# Patient Record
Sex: Male | Born: 1950 | Race: White | Hispanic: No | Marital: Married | Smoking: Former smoker
Health system: Southern US, Community
[De-identification: ages and names within clinical notes are randomized; demographics above are authoritative.]

## PROBLEM LIST (undated history)

## (undated) DIAGNOSIS — M1990 Unspecified osteoarthritis, unspecified site: Secondary | ICD-10-CM

## (undated) DIAGNOSIS — C801 Malignant (primary) neoplasm, unspecified: Secondary | ICD-10-CM

## (undated) DIAGNOSIS — E119 Type 2 diabetes mellitus without complications: Secondary | ICD-10-CM

## (undated) DIAGNOSIS — I10 Essential (primary) hypertension: Secondary | ICD-10-CM

## (undated) DIAGNOSIS — K219 Gastro-esophageal reflux disease without esophagitis: Secondary | ICD-10-CM

## (undated) DIAGNOSIS — J410 Simple chronic bronchitis: Secondary | ICD-10-CM

## (undated) DIAGNOSIS — R0600 Dyspnea, unspecified: Secondary | ICD-10-CM

## (undated) HISTORY — PX: CORONARY ARTERY BYPASS GRAFT: SHX141

## (undated) HISTORY — PX: OTHER SURGICAL HISTORY: SHX169

## (undated) HISTORY — PX: COLONOSCOPY: SHX174

---

## 2003-08-15 ENCOUNTER — Encounter: Payer: Self-pay | Admitting: Emergency Medicine

## 2003-08-15 ENCOUNTER — Emergency Department (HOSPITAL_COMMUNITY): Admission: EM | Admit: 2003-08-15 | Discharge: 2003-08-15 | Payer: Self-pay | Admitting: Emergency Medicine

## 2003-10-26 ENCOUNTER — Emergency Department (HOSPITAL_COMMUNITY): Admission: EM | Admit: 2003-10-26 | Discharge: 2003-10-26 | Payer: Self-pay | Admitting: Emergency Medicine

## 2004-09-06 ENCOUNTER — Ambulatory Visit: Payer: Self-pay | Admitting: Family Medicine

## 2005-06-27 ENCOUNTER — Ambulatory Visit: Payer: Self-pay | Admitting: Family Medicine

## 2005-09-21 ENCOUNTER — Ambulatory Visit: Payer: Self-pay | Admitting: Family Medicine

## 2005-11-03 ENCOUNTER — Ambulatory Visit: Payer: Self-pay | Admitting: Family Medicine

## 2006-01-09 ENCOUNTER — Emergency Department (HOSPITAL_COMMUNITY): Admission: EM | Admit: 2006-01-09 | Discharge: 2006-01-09 | Payer: Self-pay | Admitting: Emergency Medicine

## 2006-01-18 ENCOUNTER — Ambulatory Visit: Payer: Self-pay | Admitting: Family Medicine

## 2006-03-14 ENCOUNTER — Ambulatory Visit: Payer: Self-pay | Admitting: Family Medicine

## 2006-07-12 ENCOUNTER — Ambulatory Visit: Payer: Self-pay | Admitting: Family Medicine

## 2006-10-12 ENCOUNTER — Ambulatory Visit: Payer: Self-pay | Admitting: Family Medicine

## 2008-05-21 ENCOUNTER — Ambulatory Visit (HOSPITAL_COMMUNITY): Admission: RE | Admit: 2008-05-21 | Discharge: 2008-05-21 | Payer: Self-pay | Admitting: Gastroenterology

## 2008-05-21 ENCOUNTER — Ambulatory Visit: Payer: Self-pay | Admitting: Gastroenterology

## 2011-03-15 NOTE — Op Note (Signed)
NAME:  Dominic Mendoza, Dominic Mendoza                 ACCOUNT NO.:  [id number]   MEDICAL RECORD NO.:  [id number]          PATIENT TYPE:  AMB   LOCATION:  DAY                           FACILITY:  [facility]   PHYSICIAN:  Kassie Mends, M.D.      DATE OF BIRTH:  [birth year]   DATE OF PROCEDURE:  DATE OF DISCHARGE:                               OPERATIVE REPORT   REFERRING PHYSICIAN:  Delaney Meigs, M.D.   PROCEDURE:  Colonoscopy.   INDICATION FOR EXAM:  Mr. Dirk is a 60 year old male who presents for  average risk colon cancer screening.   FINDINGS:  1. Normal colon without evidence of polyps, masses, inflammatory      changes, diverticula, or AVM.  2. Normal retroflex view of the rectum.   RECOMMENDATIONS:  1. Screening colonoscopy in 10 years.  2. He should follow a high-fiber diet.  He is given a handout on high-      fiber diet.   MEDICATIONS:  1. Demerol 75 mg IV.  2. Versed 4 mg IV.   PROCEDURE TECHNIQUE:  Physical exam was performed and informed consent  was obtained from the patient after explaining the benefits, risks, and  alternatives to the procedure.  The patient was connected to the monitor  and placed in the left lateral position.  Continuous oxygen was provided  via nasal cannula.  IV medicine administered with an indwelling cannula.  After administration of sedation and rectal exam, the scope was advanced  under direct visualization through the cecum.  The scope was removed  slowly by carefully examining the color, texture, anatomy, and integrity  of the mucosa on the way out.  The patient was recovered in endoscopy  and discharged home in satisfactory condition.      Kassie Mends, M.D.  Electronically Signed    SM/MEDQ  D:  05/21/2008  T:  05/22/2008  Job:  [id number]   cc:   Delaney Meigs, M.D.  Fax: [phone]

## 2012-05-08 ENCOUNTER — Other Ambulatory Visit: Payer: Self-pay | Admitting: Orthopedic Surgery

## 2012-05-08 DIAGNOSIS — M545 Low back pain, unspecified: Secondary | ICD-10-CM

## 2012-05-12 ENCOUNTER — Ambulatory Visit
Admission: RE | Admit: 2012-05-12 | Discharge: 2012-05-12 | Disposition: A | Discharge status: Home / Self Care | Payer: BC Managed Care – PPO | Source: Ambulatory Visit | Attending: Orthopedic Surgery | Admitting: Orthopedic Surgery

## 2012-05-12 DIAGNOSIS — M545 Low back pain, unspecified: Secondary | ICD-10-CM

## 2013-01-31 ENCOUNTER — Other Ambulatory Visit: Payer: Self-pay | Admitting: Urology

## 2013-02-14 NOTE — Patient Instructions (Addendum)
[address]  02/14/2013   Your procedure is scheduled on:  02/25/13  MONDAY  Report to [facility] at   0515    AM.  Call this number if you have problems the morning of surgery: [phone]       Remember:   Do not eat food:After Midnight.SATURDAY NIGHT AS DIRECTED BY OFFICE FOR BOWEL PREP.  BEGIN CLEAR LIQUIDS Sunday MORNING- DRINK FLUIDS ALL DAY- INCREASE FLUIDS AND DRINK UP UNTIL BEDTIME OR MIDNIGHT Sunday NIGHT, THEN NOTHING BY MOUTH AFTER MIDNIGHT   Take these medicines the morning of surgery with A SIP OF WATER:  NONE DO NOT TAKE ANY BLOOD SUGAR MEDICINE MORNING OF SURGERY  .  Contacts, dentures or partial plates can not be worn to surgery  Leave suitcase in the car. After surgery it may be brought to your room.  For patients admitted to the hospital, checkout time is 11:00 AM day of  discharge.             SPECIAL INSTRUCTIONS- SEE [place] PREPARING FOR SURGERY INSTRUCTION SHEET-     DO NOT WEAR JEWELRY, LOTIONS, POWDERS, OR PERFUMES.  WOMEN-- DO NOT SHAVE LEGS OR UNDERARMS FOR 12 HOURS BEFORE SHOWERS. MEN MAY SHAVE FACE.  Patients discharged the day of surgery will not be allowed to drive home. IF going home the day of surgery, you must have a driver and someone to stay with you for the first 24 hours  Name and phone number of your driver:  admission                                                                      Please read over the following fact sheets that you were given: MRSA Information, Incentive Spirometry Sheet, Blood Transfusion Sheet  Information                                                                                   Donnalee Cellucci  PST 336  [phone]                 FAILURE TO FOLLOW THESE INSTRUCTIONS MAY RESULT IN  CANCELLATION   OF YOUR SURGERY                                                  Patient Signature _____________________________

## 2013-02-15 ENCOUNTER — Encounter (HOSPITAL_COMMUNITY): Payer: Self-pay | Admitting: Pharmacy Technician

## 2013-02-18 ENCOUNTER — Encounter (HOSPITAL_COMMUNITY): Payer: Self-pay

## 2013-02-18 ENCOUNTER — Encounter (HOSPITAL_COMMUNITY)
Admission: RE | Admit: 2013-02-18 | Discharge: 2013-02-18 | Disposition: A | Discharge status: Home / Self Care | Payer: BC Managed Care – PPO | Source: Ambulatory Visit | Attending: Urology | Admitting: Urology

## 2013-02-18 ENCOUNTER — Ambulatory Visit (HOSPITAL_COMMUNITY)
Admission: RE | Admit: 2013-02-18 | Discharge: 2013-02-18 | Disposition: A | Discharge status: Home / Self Care | Payer: BC Managed Care – PPO | Source: Ambulatory Visit | Attending: Urology | Admitting: Urology

## 2013-02-18 DIAGNOSIS — Z01818 Encounter for other preprocedural examination: Secondary | ICD-10-CM | POA: Insufficient documentation

## 2013-02-18 DIAGNOSIS — Z01812 Encounter for preprocedural laboratory examination: Secondary | ICD-10-CM | POA: Insufficient documentation

## 2013-02-18 DIAGNOSIS — I10 Essential (primary) hypertension: Secondary | ICD-10-CM | POA: Insufficient documentation

## 2013-02-18 DIAGNOSIS — F17200 Nicotine dependence, unspecified, uncomplicated: Secondary | ICD-10-CM | POA: Insufficient documentation

## 2013-02-18 HISTORY — DX: Malignant (primary) neoplasm, unspecified: C80.1

## 2013-02-18 HISTORY — DX: Simple chronic bronchitis: J41.0

## 2013-02-18 HISTORY — DX: Gastro-esophageal reflux disease without esophagitis: K21.9

## 2013-02-18 HISTORY — DX: Type 2 diabetes mellitus without complications: E11.9

## 2013-02-18 HISTORY — DX: Unspecified osteoarthritis, unspecified site: M19.90

## 2013-02-18 HISTORY — DX: Essential (primary) hypertension: I10

## 2013-02-18 LAB — BASIC METABOLIC PANEL
BUN: 11 mg/dL (ref 6–23)
CO2: 29 mEq/L (ref 19–32)
Calcium: 9.8 mg/dL (ref 8.4–10.5)
Chloride: 97 mEq/L (ref 96–112)
Creatinine, Ser: 0.68 mg/dL (ref 0.50–1.35)
GFR calc Af Amer: >90 mL/min (ref >90)
GFR calc non Af Amer: >90 mL/min (ref >90)
Glucose, Bld: 211 mg/dL — ABNORMAL HIGH (ref 70–99)
Potassium: 4.4 mEq/L (ref 3.5–5.1)
Sodium: 132 mEq/L — ABNORMAL LOW (ref 135–145)

## 2013-02-18 LAB — CBC
HCT: 48 % (ref 39.0–52.0)
Hemoglobin: 16.9 g/dL (ref 13.0–17.0)
MCH: 29.4 pg (ref 26.0–34.0)
MCHC: 35.2 g/dL (ref 30.0–36.0)
MCV: 83.6 fL (ref 78.0–100.0)
Platelets: 203 10*3/uL (ref 150–400)
RBC: 5.74 MIL/uL (ref 4.22–5.81)
RDW: 13.6 % (ref 11.5–15.5)
WBC: 12.5 10*3/uL — ABNORMAL HIGH (ref 4.0–10.5)

## 2013-02-18 LAB — ABO/RH: ABO/RH(D): A POS

## 2013-02-18 LAB — SURGICAL PCR SCREEN
MRSA, PCR: NEGATIVE
Staphylococcus aureus: NEGATIVE

## 2013-02-18 NOTE — Progress Notes (Signed)
Patient states was told about bowel prep at office- liquids inforced. Instructed to locate medication instructions today and if does not have this from [facility] to call them.  Is aware a "bottle to drink from drug store and fleets enema"

## 2013-02-22 NOTE — H&P (Signed)
Chief Complaint  Prostate Cancer   Reason For Visit  Reason for consult: To discuss treatment options for prostate cancer and to specifically consider a robotic prostatectomy. Physician requesting consult: Dr. Theresia Lo PCP: Dr. Celedonio Miyamoto   History of Present Illness     Dominic Mendoza is a 62 year old who was found to have an elevated PSA of 4.1 prompting a prostate biopsy by Dr. Theresia Lo on 12/25/12 which confirmed Gleason 3+4=7 adenocarcinoma of the prostate with 2 out of 10 biopsy cores positive for malignancy. He has no family history of prostate cancer. His comorbidities include a history of diabetes and hypertension. He underwent staging studies on 01/02/13 including a bone scan and CT scan of the abdomen and pelvis which were negative for metastases. He has been thoroughly counseled about his treatment options for prostate cancer by both Dr. Ian Bushman and Dr. Dan Humphreys and is interested in proceeding with surgical therapy.  He has lost approximately 30-40 pounds over the past 6 months unintentionally. He has a long history of smoking and has smoked 2 packs per day for 30 years although intermittently has quit at times. Dr. Dan Humphreys had wisely ordered a CT scan of the chest on 01/15/13. This fortunately demonstrated no evidence of a primary lung neoplasm to explain his weight loss.  TNM stage: cT2a N0 M0 (L base) PSA: 4.1 Gleason score: 3+4=7 Biopsy (12/25/12- read by Dr. Hollice Espy, Acc # [id number], [facility]): 2/10 cores positive -- Right (1/5 cores positive, < 2% of overall tissue, 3+4=7), Left (1/5 cores positive, < 2% of overall tissue, 3+4=7) Prostate volume: 21 cc  Nomogram: OC disease: 81% EPE: 13% SVI: 1% LNI: 2.2% PFS (surgery): 95% at 5 year, 93% at 10 years  Urinary function: He does have significant baseline lower urinary tract symptoms including a sense of incomplete emptying, urinary frequency, intermittency, urgency, weak stream, and nocturia. IPSS is 23 (QOL-6). This is  despite a small prostate measured on transrectal ultrasound at the time of his biopsy. Erectile function: He does have severe pre-existing erectile dysfunction. SHIM score is 9.   Past Medical History Problems  1. History of  Anxiety (Symptom) 300.00 2. History of  Arthritis V13.4 3. History of  Diabetes Mellitus 250.00 4. History of  Esophageal Reflux 530.81 5. History of  Heart Disease 429.9 6. History of  Hypercholesterolemia 272.0 7. History of  Hypertension 401.9  Surgical History Problems  1. History of  Biopsy Skin  Current Meds 1. Amlodipine Besy-Benazepril HCl 5-10 MG Oral Capsule; Therapy: (Recorded:31Mar2014) to 2. Aspirin 81 MG Oral Tablet; Therapy: (Recorded:31Mar2014) to 3. Diazepam 10 MG Oral Tablet; Therapy: 18Feb2014 to 4. GlipiZIDE 5 MG Oral Tablet; Therapy: (Recorded:31Mar2014) to 5. Lipitor 10 MG Oral Tablet; Therapy: (Recorded:01Apr2014) to 6. MetFORMIN HCl 500 MG Oral Tablet; Therapy: (Recorded:31Mar2014) to  Allergies Medication  1. No Known Drug Allergies  Family History Problems  1. Paternal history of  Cancer 2. Maternal history of  Heart Disease V17.49 3. Sororal history of  Skin Cancer V16.8 4. Fraternal history of  Transient Ischemic Attack 5. Paternal history of  Urinary Calculus  Social History Problems    Marital History - Currently Married   Occupation: Engineer, site   Tobacco Use 305.1 smokes 2 packs a day - has smoked for 30 years Denied    History of  Alcohol Use  Review of Systems Genitourinary, constitutional, skin, eye, otolaryngeal, hematologic/lymphatic, cardiovascular, pulmonary, endocrine, musculoskeletal, gastrointestinal, neurological and psychiatric system(s) were reviewed and pertinent findings if present  are noted.  Gastrointestinal: heartburn, diarrhea and constipation.  Constitutional: recent weight loss.  Eyes: blurred vision.  ENT: sinus problems.  Respiratory: cough.  Musculoskeletal: back pain and  joint pain.  Neurological: headache.  Psychiatric: anxiety.    Vitals Vital Signs [Data Includes: Last 1 Day]  01Apr2014 08:17AM  BMI Calculated: 30.36 BSA Calculated: 2.09 Height: 5 ft 9 in Weight: 205 lb  Blood Pressure: 179 / 83 Heart Rate: 63  Physical Exam Constitutional: Well nourished and well developed . No acute distress.  ENT:. The ears and nose are normal in appearance.  Neck: The appearance of the neck is normal and no neck mass is present.  Pulmonary: No respiratory distress, normal respiratory rhythm and effort and clear bilateral breath sounds.  Cardiovascular: Heart rate and rhythm are normal . No peripheral edema.  Abdomen: The abdomen is mildly obese. The abdomen is soft and nontender. No masses are palpated. No CVA tenderness. No hernias are palpable. No hepatosplenomegaly noted.  Rectal: Rectal exam demonstrates normal sphincter tone, no tenderness and no masses. Prostate size is estimated to be 35 g. On exam, he does appear to have a nodule at the left base of the prostate consistent with a T2a lesion. The prostate is not tender. The left seminal vesicle is nonpalpable. The right seminal vesicle is nonpalpable. The perineum is normal on inspection.  Lymphatics: The femoral and inguinal nodes are not enlarged or tender.  Skin: Normal skin turgor, no visible rash and no visible skin lesions.  Neuro/Psych:. Mood and affect are appropriate.    Results/Data Urine [Data Includes: Last 1 Day]   01Apr2014  COLOR YELLOW   APPEARANCE CLEAR   SPECIFIC GRAVITY 1.010   pH 6.0   GLUCOSE 500 mg/dL  BILIRUBIN NEG   KETONE NEG mg/dL  BLOOD NEG   PROTEIN NEG mg/dL  UROBILINOGEN 0.2 mg/dL  NITRITE NEG   LEUKOCYTE ESTERASE NEG     I have reviewed his medical records, pathology report and the PSA results, and independently reviewed his reports and images which include his CT scan of the chest, CT scan of the abdomen and pelvis, and bone scan. Findings are as outlined above.    Assessment Assessed  1. Prostate Cancer 185  Plan Health Maintenance (V70.0)  1. UA With REFLEX  Done: 01Apr2014 08:02AM Prostate Cancer (185)  2. Follow-up Schedule Surgery Office  Follow-up  Done: 01Apr2014 3. PT/OT Referral Referral  Referral  Requested for: 01Apr2014  Discussion/Summary  1. Prostate cancer: I had a detailed discussion with Dominic Mendoza and his wife today. I do not have a clear explanation for his unintentional weight loss although am very much reassured by his recent negative CT scan of the chest. I do not think the symptoms are likely related to his prostate cancer.   The patient was counseled about the natural history of prostate cancer and the standard treatment options that are available for prostate cancer. It was explained to him how his age and life expectancy, clinical stage, Gleason score, and PSA affect his prognosis, the decision to proceed with additional staging studies, as well as how that information influences recommended treatment strategies. We discussed the roles for active surveillance, radiation therapy, surgical therapy, androgen deprivation, as well as ablative therapy options for the treatment of prostate cancer as appropriate to his individual cancer situation. We discussed the risks and benefits of these options with regard to their impact on cancer control and also in terms of potential adverse events, complications, and impact  on quiality of life particularly related to urinary, bowel, and sexual function. The patient was encouraged to ask questions throughout the discussion today and all questions were answered to his stated satisfaction. In addition, the patient was provided with and/or directed to appropriate resources and literature for further education about prostate cancer and treatment options.   We discussed surgical therapy for prostate cancer including the different available surgical approaches. We discussed, in detail, the risks and  expectations of surgery with regard to cancer control, urinary control, and erectile function as well as the expected postoperative recovery process. Additional risks of surgery including but not limited to bleeding, infection, hernia formation, nerve damage, lymphocele formation, bowel/rectal injury potentially necessitating colostomy, damage to the urinary tract resulting in urine leakage, urethral stricture, and the cardiopulmonary risks such as myocardial infarction, stroke, death, venothromboembolism, etc. were explained. The risk of open surgical conversion for robotic/laparoscopic prostatectomy was also discussed.   He would like to proceed with surgical treatment of his prostate cancer which I agree with considering his significant baseline lower urinary tract symptoms. He will be scheduled for a right nerve sparing robotic-assisted laparoscopic radical prostatectomy and bilateral pelvic lymphadenectomy.  Cc: Dr. Illene Bolus Hurt Dr. Kathrin Greathouse Dr. Celedonio Miyamoto    SignaturesElectronically signed by : Heloise Purpura, M.D.; Jan 29 2013  7:25PM

## 2013-02-24 NOTE — Anesthesia Preprocedure Evaluation (Addendum)
Anesthesia Evaluation  Patient identified by MRN, date of birth, ID band Patient awake    Reviewed: Allergy & Precautions, H&P , NPO status , Patient's Chart, lab work & pertinent test results  Airway Mallampati: II TM Distance: >3 FB Neck ROM: Full    Dental  (+) Poor Dentition, Dental Advisory Given, Missing and Chipped   Pulmonary COPDCurrent Smoker,  + rhonchi   + decreased breath sounds      Cardiovascular hypertension, Pt. on medications Rhythm:Regular Rate:Normal  EKG: NSR with Right BBB   Neuro/Psych negative neurological ROS  negative psych ROS   GI/Hepatic Neg liver ROS, GERD-  ,  Endo/Other  negative endocrine ROSdiabetes, Poorly Controlled, Type 2, Oral Hypoglycemic Agents  Renal/GU negative Renal ROS   Neoplasm prostate negative genitourinary   Musculoskeletal negative musculoskeletal ROS (+)   Abdominal   Peds  Hematology negative hematology ROS (+)   Anesthesia Other Findings   Reproductive/Obstetrics negative OB ROS                          Anesthesia Physical Anesthesia Plan  ASA: III  Anesthesia Plan: General   Post-op Pain Management:    Induction: Intravenous  Airway Management Planned: Oral ETT  Additional Equipment:   Intra-op Plan:   Post-operative Plan: Extubation in OR  Informed Consent: I have reviewed the patients History and Physical, chart, labs and discussed the procedure including the risks, benefits and alternatives for the proposed anesthesia with the patient or authorized representative who has indicated his/her understanding and acceptance.   Dental advisory given  Plan Discussed with:   Anesthesia Plan Comments: (Ventolin aerosol treatment in holding area)       Anesthesia Quick Evaluation

## 2013-02-25 ENCOUNTER — Encounter (HOSPITAL_COMMUNITY): Payer: Self-pay | Admitting: *Deleted

## 2013-02-25 ENCOUNTER — Encounter (HOSPITAL_COMMUNITY): Payer: Self-pay | Admitting: Anesthesiology

## 2013-02-25 ENCOUNTER — Inpatient Hospital Stay (HOSPITAL_COMMUNITY): Payer: BC Managed Care – PPO | Admitting: Anesthesiology

## 2013-02-25 ENCOUNTER — Encounter (HOSPITAL_COMMUNITY)
Admission: RE | Disposition: A | Discharge status: Home / Self Care | Payer: Self-pay | Source: Ambulatory Visit | Attending: Urology

## 2013-02-25 ENCOUNTER — Inpatient Hospital Stay (HOSPITAL_COMMUNITY)
Admission: RE | Admit: 2013-02-25 | Discharge: 2013-02-26 | DRG: 334 | Disposition: A | Discharge status: Home / Self Care | Payer: BC Managed Care – PPO | Source: Ambulatory Visit | Attending: Urology | Admitting: Urology

## 2013-02-25 DIAGNOSIS — N529 Male erectile dysfunction, unspecified: Secondary | ICD-10-CM | POA: Diagnosis present

## 2013-02-25 DIAGNOSIS — J449 Chronic obstructive pulmonary disease, unspecified: Secondary | ICD-10-CM | POA: Diagnosis present

## 2013-02-25 DIAGNOSIS — F17200 Nicotine dependence, unspecified, uncomplicated: Secondary | ICD-10-CM | POA: Diagnosis present

## 2013-02-25 DIAGNOSIS — E119 Type 2 diabetes mellitus without complications: Secondary | ICD-10-CM | POA: Diagnosis present

## 2013-02-25 DIAGNOSIS — R634 Abnormal weight loss: Secondary | ICD-10-CM | POA: Diagnosis present

## 2013-02-25 DIAGNOSIS — I10 Essential (primary) hypertension: Secondary | ICD-10-CM | POA: Diagnosis present

## 2013-02-25 DIAGNOSIS — J4489 Other specified chronic obstructive pulmonary disease: Secondary | ICD-10-CM | POA: Diagnosis present

## 2013-02-25 DIAGNOSIS — C61 Malignant neoplasm of prostate: Principal | ICD-10-CM | POA: Diagnosis present

## 2013-02-25 HISTORY — PX: LYMPHADENECTOMY: SHX5960

## 2013-02-25 HISTORY — PX: ROBOT ASSISTED LAPAROSCOPIC RADICAL PROSTATECTOMY: SHX5141

## 2013-02-25 LAB — HEMOGLOBIN AND HEMATOCRIT, BLOOD
HCT: 43.7 % (ref 39.0–52.0)
Hemoglobin: 15.1 g/dL (ref 13.0–17.0)

## 2013-02-25 LAB — TYPE AND SCREEN
ABO/RH(D): A POS
Antibody Screen: NEGATIVE

## 2013-02-25 LAB — GLUCOSE, CAPILLARY
Glucose-Capillary: 169 mg/dL — ABNORMAL HIGH (ref 70–99)
Glucose-Capillary: 224 mg/dL — ABNORMAL HIGH (ref 70–99)
Glucose-Capillary: 230 mg/dL — ABNORMAL HIGH (ref 70–99)
Glucose-Capillary: 217 mg/dL — ABNORMAL HIGH (ref 70–99)
Glucose-Capillary: 220 mg/dL — ABNORMAL HIGH (ref 70–99)
Glucose-Capillary: 192 mg/dL — ABNORMAL HIGH (ref 70–99)

## 2013-02-25 SURGERY — ROBOTIC ASSISTED LAPAROSCOPIC RADICAL PROSTATECTOMY LEVEL 2
Anesthesia: General | Wound class: Clean Contaminated

## 2013-02-25 MED ORDER — MAGNESIUM CITRATE PO SOLN
1.0000 | Freq: Once | ORAL | Status: DC
  Filled 2013-02-25 (×2): qty 296

## 2013-02-25 MED ORDER — INSULIN ASPART 100 UNIT/ML ~~LOC~~ SOLN
SUBCUTANEOUS | Status: AC
  Filled 2013-02-25: qty 1

## 2013-02-25 MED ORDER — LACTATED RINGERS IV SOLN
INTRAVENOUS | Status: DC | PRN
  Administered 2013-02-25 (×2): via INTRAVENOUS

## 2013-02-25 MED ORDER — GLYCOPYRROLATE 0.2 MG/ML IJ SOLN
INTRAMUSCULAR | Status: DC | PRN
  Administered 2013-02-25: .6 mg via INTRAVENOUS

## 2013-02-25 MED ORDER — ACETAMINOPHEN 325 MG PO TABS: 650.0000 mg | ORAL_TABLET | ORAL | Status: DC | PRN

## 2013-02-25 MED ORDER — DIPHENHYDRAMINE HCL 12.5 MG/5ML PO ELIX: 12.5000 mg | ORAL_SOLUTION | Freq: Four times a day (QID) | ORAL | Status: DC | PRN

## 2013-02-25 MED ORDER — HYDROMORPHONE HCL PF 1 MG/ML IJ SOLN
0.2500 mg | INTRAMUSCULAR | Status: DC | PRN
  Administered 2013-02-25 (×4): 0.5 mg via INTRAVENOUS

## 2013-02-25 MED ORDER — INSULIN ASPART 100 UNIT/ML ~~LOC~~ SOLN
0.0000 [IU] | SUBCUTANEOUS | Status: DC
  Administered 2013-02-25 (×2): 5 [IU] via SUBCUTANEOUS
  Administered 2013-02-25: 3 [IU] via SUBCUTANEOUS
  Administered 2013-02-25: 5 [IU] via SUBCUTANEOUS
  Administered 2013-02-26: 3 [IU] via SUBCUTANEOUS
  Administered 2013-02-26 (×2): 2 [IU] via SUBCUTANEOUS
  Administered 2013-02-26: 3 [IU] via SUBCUTANEOUS

## 2013-02-25 MED ORDER — SUCCINYLCHOLINE CHLORIDE 20 MG/ML IJ SOLN
INTRAMUSCULAR | Status: DC | PRN
  Administered 2013-02-25: 100 mg via INTRAVENOUS

## 2013-02-25 MED ORDER — ACETAMINOPHEN 10 MG/ML IV SOLN
INTRAVENOUS | Status: DC | PRN
  Administered 2013-02-25: 1000 mg via INTRAVENOUS

## 2013-02-25 MED ORDER — NEOSTIGMINE METHYLSULFATE 1 MG/ML IJ SOLN
INTRAMUSCULAR | Status: DC | PRN
  Administered 2013-02-25: 3 mg via INTRAVENOUS

## 2013-02-25 MED ORDER — ROCURONIUM BROMIDE 100 MG/10ML IV SOLN
INTRAVENOUS | Status: DC | PRN
  Administered 2013-02-25: 10 mg via INTRAVENOUS
  Administered 2013-02-25: 30 mg via INTRAVENOUS
  Administered 2013-02-25: 20 mg via INTRAVENOUS

## 2013-02-25 MED ORDER — ALBUTEROL SULFATE (5 MG/ML) 0.5% IN NEBU
2.5000 mg | INHALATION_SOLUTION | Freq: Once | RESPIRATORY_TRACT | Status: DC
  Administered 2013-02-25: 2.5 mg via RESPIRATORY_TRACT

## 2013-02-25 MED ORDER — POTASSIUM CHLORIDE IN NACL 20-0.45 MEQ/L-% IV SOLN
INTRAVENOUS | Status: DC
  Administered 2013-02-25 – 2013-02-26 (×3): via INTRAVENOUS
  Filled 2013-02-25 (×11): qty 1000

## 2013-02-25 MED ORDER — AMLODIPINE BESYLATE 5 MG PO TABS
5.0000 mg | ORAL_TABLET | Freq: Once | ORAL | Status: AC
  Administered 2013-02-25: 5 mg via ORAL
  Filled 2013-02-25: qty 1

## 2013-02-25 MED ORDER — KETOROLAC TROMETHAMINE 15 MG/ML IJ SOLN
INTRAMUSCULAR | Status: AC
  Administered 2013-02-25: 15 mg
  Filled 2013-02-25: qty 1

## 2013-02-25 MED ORDER — PROPOFOL 10 MG/ML IV BOLUS
INTRAVENOUS | Status: DC | PRN
  Administered 2013-02-25: 180 mg via INTRAVENOUS

## 2013-02-25 MED ORDER — STERILE WATER FOR IRRIGATION IR SOLN
Status: DC | PRN
  Administered 2013-02-25: 3000 mL

## 2013-02-25 MED ORDER — HEPARIN SODIUM (PORCINE) 1000 UNIT/ML IJ SOLN
INTRAMUSCULAR | Status: AC
  Filled 2013-02-25: qty 1

## 2013-02-25 MED ORDER — CEFAZOLIN SODIUM 1-5 GM-% IV SOLN
1.0000 g | Freq: Three times a day (TID) | INTRAVENOUS | Status: AC
  Administered 2013-02-25 – 2013-02-26 (×2): 1 g via INTRAVENOUS
  Filled 2013-02-25 (×2): qty 50

## 2013-02-25 MED ORDER — ATORVASTATIN CALCIUM 40 MG PO TABS
40.0000 mg | ORAL_TABLET | Freq: Every day | ORAL | Status: DC
  Administered 2013-02-25: 40 mg via ORAL
  Filled 2013-02-25 (×2): qty 1

## 2013-02-25 MED ORDER — LACTATED RINGERS IV SOLN: INTRAVENOUS | Status: DC

## 2013-02-25 MED ORDER — FENTANYL CITRATE 0.05 MG/ML IJ SOLN
INTRAMUSCULAR | Status: DC | PRN
  Administered 2013-02-25: 50 ug via INTRAVENOUS
  Administered 2013-02-25: 100 ug via INTRAVENOUS
  Administered 2013-02-25: 50 ug via INTRAVENOUS
  Administered 2013-02-25: 150 ug via INTRAVENOUS

## 2013-02-25 MED ORDER — CEFAZOLIN SODIUM-DEXTROSE 2-3 GM-% IV SOLR
2.0000 g | INTRAVENOUS | Status: AC
  Administered 2013-02-25: 2 g via INTRAVENOUS

## 2013-02-25 MED ORDER — HYDROMORPHONE HCL PF 1 MG/ML IJ SOLN
INTRAMUSCULAR | Status: AC
  Filled 2013-02-25: qty 2

## 2013-02-25 MED ORDER — ONDANSETRON HCL 4 MG/2ML IJ SOLN
INTRAMUSCULAR | Status: DC | PRN
  Administered 2013-02-25: 4 mg via INTRAVENOUS

## 2013-02-25 MED ORDER — DIPHENHYDRAMINE HCL 50 MG/ML IJ SOLN: 12.5000 mg | Freq: Four times a day (QID) | INTRAMUSCULAR | Status: DC | PRN

## 2013-02-25 MED ORDER — INDIGOTINDISULFONATE SODIUM 8 MG/ML IJ SOLN
INTRAMUSCULAR | Status: DC | PRN
  Administered 2013-02-25 (×2): 5 mL via INTRAVENOUS

## 2013-02-25 MED ORDER — LABETALOL HCL 5 MG/ML IV SOLN
INTRAVENOUS | Status: DC | PRN
  Administered 2013-02-25 (×2): 5 mg via INTRAVENOUS

## 2013-02-25 MED ORDER — BUPIVACAINE-EPINEPHRINE PF 0.25-1:200000 % IJ SOLN
INTRAMUSCULAR | Status: AC
  Filled 2013-02-25: qty 30

## 2013-02-25 MED ORDER — HYDROMORPHONE HCL PF 1 MG/ML IJ SOLN
INTRAMUSCULAR | Status: DC | PRN
  Administered 2013-02-25 (×5): .4 mg via INTRAVENOUS

## 2013-02-25 MED ORDER — CIPROFLOXACIN HCL 500 MG PO TABS: 500.0000 mg | ORAL_TABLET | Freq: Two times a day (BID) | ORAL | Status: DC

## 2013-02-25 MED ORDER — LIDOCAINE HCL (PF) 2 % IJ SOLN
INTRAMUSCULAR | Status: DC | PRN
  Administered 2013-02-25: 40 mg

## 2013-02-25 MED ORDER — DOCUSATE SODIUM 100 MG PO CAPS
100.0000 mg | ORAL_CAPSULE | Freq: Two times a day (BID) | ORAL | Status: DC
  Administered 2013-02-25 – 2013-02-26 (×2): 100 mg via ORAL
  Filled 2013-02-25 (×4): qty 1

## 2013-02-25 MED ORDER — SODIUM CHLORIDE 0.9 % IV BOLUS (SEPSIS)
1000.0000 mL | Freq: Once | INTRAVENOUS | Status: AC
  Administered 2013-02-25: 1000 mL via INTRAVENOUS

## 2013-02-25 MED ORDER — LACTATED RINGERS IV SOLN
INTRAVENOUS | Status: DC | PRN
  Administered 2013-02-25: 08:00:00

## 2013-02-25 MED ORDER — LIDOCAINE HCL 4 % MT SOLN
OROMUCOSAL | Status: DC | PRN
  Administered 2013-02-25: 4 mL via TOPICAL

## 2013-02-25 MED ORDER — BUPIVACAINE-EPINEPHRINE 0.25% -1:200000 IJ SOLN
INTRAMUSCULAR | Status: DC | PRN
  Administered 2013-02-25: 30 mL

## 2013-02-25 MED ORDER — PNEUMOCOCCAL VAC POLYVALENT 25 MCG/0.5ML IJ INJ
0.5000 mL | INJECTION | INTRAMUSCULAR | Status: AC
  Administered 2013-02-26: 0.5 mL via INTRAMUSCULAR
  Filled 2013-02-25 (×2): qty 0.5

## 2013-02-25 MED ORDER — PROMETHAZINE HCL 25 MG/ML IJ SOLN: 6.2500 mg | INTRAMUSCULAR | Status: DC | PRN

## 2013-02-25 MED ORDER — MIDAZOLAM HCL 5 MG/5ML IJ SOLN
INTRAMUSCULAR | Status: DC | PRN
  Administered 2013-02-25: 2 mg via INTRAVENOUS

## 2013-02-25 MED ORDER — SODIUM CHLORIDE 0.9 % IR SOLN
Status: DC | PRN
  Administered 2013-02-25: 1000 mL

## 2013-02-25 MED ORDER — INDIGOTINDISULFONATE SODIUM 8 MG/ML IJ SOLN
INTRAMUSCULAR | Status: AC
  Filled 2013-02-25: qty 10

## 2013-02-25 MED ORDER — HYDROCODONE-ACETAMINOPHEN 5-325 MG PO TABS: 1.0000 | ORAL_TABLET | Freq: Four times a day (QID) | ORAL | Status: DC | PRN

## 2013-02-25 MED ORDER — MORPHINE SULFATE 2 MG/ML IJ SOLN
2.0000 mg | INTRAMUSCULAR | Status: DC | PRN
  Administered 2013-02-26 (×2): 2 mg via INTRAVENOUS
  Filled 2013-02-25 (×2): qty 1

## 2013-02-25 MED ORDER — KETOROLAC TROMETHAMINE 15 MG/ML IJ SOLN
15.0000 mg | Freq: Four times a day (QID) | INTRAMUSCULAR | Status: DC
  Administered 2013-02-25 – 2013-02-26 (×5): 15 mg via INTRAVENOUS
  Filled 2013-02-25 (×6): qty 1

## 2013-02-25 SURGICAL SUPPLY — 43 items
CANISTER SUCTION 2500CC (MISCELLANEOUS) ×3 IMPLANT
CATH FOLEY 2WAY SLVR 18FR 30CC (CATHETERS) ×3 IMPLANT
CATH ROBINSON RED A/P 16FR (CATHETERS) ×3 IMPLANT
CATH ROBINSON RED A/P 8FR (CATHETERS) ×3 IMPLANT
CATH TIEMANN FOLEY 18FR 5CC (CATHETERS) ×3 IMPLANT
CHLORAPREP W/TINT 26ML (MISCELLANEOUS) ×3 IMPLANT
CLIP LIGATING HEM O LOK PURPLE (MISCELLANEOUS) ×6 IMPLANT
CLOTH BEACON ORANGE TIMEOUT ST (SAFETY) ×3 IMPLANT
CORD HIGH FREQUENCY UNIPOLAR (ELECTROSURGICAL) ×3 IMPLANT
COVER SURGICAL LIGHT HANDLE (MISCELLANEOUS) ×3 IMPLANT
COVER TIP SHEARS 8 DVNC (MISCELLANEOUS) ×2 IMPLANT
COVER TIP SHEARS 8MM DA VINCI (MISCELLANEOUS) ×1
CUTTER ECHEON FLEX ENDO 45 340 (ENDOMECHANICALS) ×3 IMPLANT
DECANTER SPIKE VIAL GLASS SM (MISCELLANEOUS) ×3 IMPLANT
DRAPE SURG IRRIG POUCH 19X23 (DRAPES) ×3 IMPLANT
DRSG TEGADERM 2-3/8X2-3/4 SM (GAUZE/BANDAGES/DRESSINGS) ×12 IMPLANT
DRSG TEGADERM 4X4.75 (GAUZE/BANDAGES/DRESSINGS) ×6 IMPLANT
DRSG TEGADERM 6X8 (GAUZE/BANDAGES/DRESSINGS) ×9 IMPLANT
ELECT REM PT RETURN 9FT ADLT (ELECTROSURGICAL) ×3
ELECTRODE REM PT RTRN 9FT ADLT (ELECTROSURGICAL) ×2 IMPLANT
GAUZE SPONGE 2X2 8PLY STRL LF (GAUZE/BANDAGES/DRESSINGS) ×2 IMPLANT
GLOVE BIO SURGEON STRL SZ 6.5 (GLOVE) ×3 IMPLANT
GLOVE BIOGEL M STRL SZ7.5 (GLOVE) ×6 IMPLANT
GOWN STRL NON-REIN LRG LVL3 (GOWN DISPOSABLE) ×9 IMPLANT
GOWN STRL REIN XL XLG (GOWN DISPOSABLE) ×6 IMPLANT
HOLDER FOLEY CATH W/STRAP (MISCELLANEOUS) ×3 IMPLANT
IV LACTATED RINGERS 1000ML (IV SOLUTION) IMPLANT
KIT ACCESSORY DA VINCI DISP (KITS) ×1
KIT ACCESSORY DVNC DISP (KITS) ×2 IMPLANT
NDL SAFETY ECLIPSE 18X1.5 (NEEDLE) ×2 IMPLANT
NEEDLE HYPO 18GX1.5 SHARP (NEEDLE) ×1
PACK ROBOT UROLOGY CUSTOM (CUSTOM PROCEDURE TRAY) ×3 IMPLANT
RELOAD GREEN ECHELON 45 (STAPLE) ×3 IMPLANT
SET TUBE IRRIG SUCTION NO TIP (IRRIGATION / IRRIGATOR) ×3 IMPLANT
SOLUTION ELECTROLUBE (MISCELLANEOUS) ×3 IMPLANT
SPONGE GAUZE 2X2 STER 10/PKG (GAUZE/BANDAGES/DRESSINGS) ×1
SUT ETHILON 3 0 PS 1 (SUTURE) ×3 IMPLANT
SUT VICRYL 0 UR6 27IN ABS (SUTURE) ×6 IMPLANT
SYR 27GX1/2 1ML LL SAFETY (SYRINGE) ×3 IMPLANT
TOWEL OR 17X26 10 PK STRL BLUE (TOWEL DISPOSABLE) IMPLANT
TOWEL OR NON WOVEN STRL DISP B (DISPOSABLE) ×3 IMPLANT
TROCAR 12M 150ML BLUNT (TROCAR) ×3 IMPLANT
WATER STERILE IRR 1500ML POUR (IV SOLUTION) IMPLANT

## 2013-02-25 NOTE — Op Note (Signed)
Preoperative diagnosis: Clinically localized adenocarcinoma of the prostate (clinical stage T2a N0 M))  Postoperative diagnosis: Clinically localized adenocarcinoma of the prostate (clinical stage T2a N0 Mo)  Procedure:  1. Robotic assisted laparoscopic radical prostatectomy (bilateral nerve sparing) 2. Bilateral robotic assisted laparoscopic pelvic lymphadenectomy  Surgeon: Moody Bruins. M.D.  Assistant: Pecola Leisure, PA-C  Anesthesia: General  Complications: None  EBL: 100 mL  IVF:  1100 mL crystalloid  Specimens: 1. Prostate and seminal vesicles 2. Right pelvic lymph nodes 3. Left pelvic lymph nodes  Disposition of specimens: Pathology  Drains: 1. 20 Fr coude catheter 2. # 19 Blake pelvic drain  Indication: Dominic Mendoza is a 62 y.o. year old patient with clinically localized prostate cancer.  After a thorough review of the management options for treatment of prostate cancer, he elected to proceed with surgical therapy and the above procedure(s).  We have discussed the potential benefits and risks of the procedure, side effects of the proposed treatment, the likelihood of the patient achieving the goals of the procedure, and any potential problems that might occur during the procedure or recuperation. Informed consent has been obtained.  Description of procedure:  The patient was taken to the operating room and a general anesthetic was administered. He was given preoperative antibiotics, placed in the dorsal lithotomy position, and prepped and draped in the usual sterile fashion. Next a preoperative timeout was performed. A urethral catheter was placed into the bladder and a site was selected near the umbilicus for placement of the camera port. This was placed using a standard open Hassan technique which allowed entry into the peritoneal cavity under direct vision and without difficulty. A long 150 cm 12 mm port was placed and a pneumoperitoneum established. The  camera was then used to inspect the abdomen and there was no evidence of any intra-abdominal injuries or other abnormalities. The remaining abdominal ports were then placed.  Long 8 mm robotic ports were placed in the right lower quadrant, left lower quadrant, and far left lateral abdominal wall. A 5 mm port was placed in the right upper quadrant and a 12 mm port was placed in the right lateral abdominal wall for laparoscopic assistance. All ports were placed under direct vision without difficulty. The surgical cart was then docked.   Utilizing the cautery scissors, the bladder was reflected posteriorly allowing entry into the space of Retzius and identification of the endopelvic fascia and prostate. The periprostatic fat was then removed from the prostate allowing full exposure of the endopelvic fascia. The endopelvic fascia was then incised from the apex back to the base of the prostate bilaterally and the underlying levator muscle fibers were swept laterally off the prostate thereby isolating the dorsal venous complex. The dorsal vein was then stapled and divided with a 45 mm Flex Echelon stapler. Attention then turned to the bladder neck which was divided anteriorly thereby allowing entry into the bladder and exposure of the urethral catheter. The catheter balloon was deflated and the catheter was brought into the operative field and used to retract the prostate anteriorly. The posterior bladder neck was then examined and was divided allowing further dissection between the bladder and prostate posteriorly until the vasa deferentia and seminal vessels were identified. The vasa deferentia were isolated, divided, and lifted anteriorly. The seminal vesicles were dissected down to their tips with care to control the seminal vascular arterial blood supply. These structures were then lifted anteriorly and the space between Denonvillier's fascia and the anterior rectum  was developed with a combination of sharp and  blunt dissection. This isolated the vascular pedicles of the prostate.  The lateral prostatic fascia was then sharply incised allowing release of the neurovascular bundles bilaterally. The vascular pedicles of the prostate were then ligated with Weck clips between the prostate and neurovascular bundles and divided with sharp cold scissor dissection resulting in neurovascular bundle preservation. The neurovascular bundles were then separated off the apex of the prostate and urethra bilaterally.  The urethra was then sharply transected allowing the prostate specimen to be disarticulated. The pelvis was copiously irrigated and hemostasis was ensured. There was no evidence for rectal injury.  Attention then turned to the right pelvic sidewall. The fibrofatty tissue between the external iliac vein, confluence of the iliac vessels, hypogastric artery, and Cooper's ligament was dissected free from the pelvic sidewall with care to preserve the obturator nerve. Weck clips were used for lymphostasis and hemostasis. An identical procedure was performed on the contralateral side and the lymphatic packets were removed for permanent pathologic analysis.  Attention then turned to the urethral anastomosis. A 2-0 Vicryl slip knot was placed between Denonvillier's fascia, the posterior bladder neck, and the posterior urethra to reapproximate these structures. A double-armed 3-0 Monocryl suture was then used to perform a 360 running tension-free anastomosis between the bladder neck and urethra. A new urethral catheter was then placed into the bladder and irrigated. There were no blood clots within the bladder and the anastomosis appeared to be watertight. A #19 Blake drain was then brought through the left lateral 8 mm port site and positioned appropriately within the pelvis. It was secured to the skin with a nylon suture. The surgical cart was then undocked. The right lateral 12 mm port site was closed at the fascial level  with a 0 Vicryl suture placed laparoscopically. All remaining ports were then removed under direct vision. The prostate specimen was removed intact within the Endopouch retrieval bag via the periumbilical camera port site. This fascial opening was closed with two running 0 Vicryl sutures. 0.25% Marcaine was then injected into all port sites and all incisions were reapproximated at the skin level with staples. Sterile dressings were applied. The patient appeared to tolerate the procedure well and without complications. The patient was able to be extubated and transferred to the recovery unit in satisfactory condition.   Moody Bruins MD

## 2013-02-25 NOTE — Progress Notes (Signed)
Patient ID: ANQUAN AZZARELLO, male   DOB: 09-02-1951, 62 y.o.   MRN: [id number] Post-op note  Subjective: The patient is doing well.  No complaints.  Denies N/V.  His IV came out and the RN is currently in the process of replacing.    Objective: Vital signs in last 24 hours: Temp:  [97.8 F (36.6 C)-98.2 F (36.8 C)] 98.2 F (36.8 C) (04/28 1515) Pulse Rate:  [59-76] 64 (04/28 1515) Resp:  [10-18] 18 (04/28 1515) BP: (128-158)/(67-83) 128/69 mmHg (04/28 1515) SpO2:  [97 %-100 %] 97 % (04/28 1515) Weight:  [93.8 kg (206 lb 12.7 oz)] 93.8 kg (206 lb 12.7 oz) (04/28 1128)  Intake/Output from previous day:   Intake/Output this shift: Total I/O In: 3677.5 [P.O.:120; I.V.:2557.5; IV Piggyback:1000] Out: 540 [Urine:400; Drains:40; Blood:100]  Physical Exam:  General: Alert and oriented. Abdomen: Soft, Nondistended. Incisions: Clean and dry.  Lab Results:  Recent Labs  02/25/13 1055  HGB 15.1  HCT 43.7    Assessment/Plan: POD#0   1) Continue to monitor 2) Monitor UO  3) Pain control, DVT prophy, amb, IS    LOS: 0 days   Silas Flood. 02/25/2013, 3:26 PM

## 2013-02-25 NOTE — Anesthesia Procedure Notes (Addendum)
Procedure Name: Intubation Date/Time: 02/25/2013 7:30 AM Performed by: Elyn Peers Pre-anesthesia Checklist: Patient identified, Emergency Drugs available, Suction available, Patient being monitored and Timeout performed Patient Re-evaluated:Patient Re-evaluated prior to inductionOxygen Delivery Method: Circle system utilized Preoxygenation: Pre-oxygenation with 100% oxygen Intubation Type: IV induction Ventilation: Mask ventilation without difficulty Laryngoscope Size: Miller and 3 Grade View: Grade I Tube type: Oral Tube size: 7.5 mm Number of attempts: 1 Airway Equipment and Method: Stylet and LTA kit utilized Placement Confirmation: ETT inserted through vocal cords under direct vision,  positive ETCO2 and breath sounds checked- equal and bilateral Secured at: 21 cm Tube secured with: Tape Dental Injury: Teeth and Oropharynx as per pre-operative assessment

## 2013-02-25 NOTE — Interval H&P Note (Signed)
History and Physical Interval Note:  02/25/2013 7:13 AM  Dominic Mendoza  has presented today for surgery, with the diagnosis of Prostate Cancer  The various methods of treatment have been discussed with the patient and family. After consideration of risks, benefits and other options for treatment, the patient has consented to  Procedure(s) with comments: ROBOTIC ASSISTED LAPAROSCOPIC RADICAL PROSTATECTOMY LEVEL 2 (N/A) LYMPHADENECTOMY (Bilateral) - (BIL) PELVIC  as a surgical intervention .  The patient's history has been reviewed, patient examined, no change in status, stable for surgery.  I have reviewed the patient's chart and labs.  Questions were answered to the patient's satisfaction.     Sergio Zawislak,LES

## 2013-02-25 NOTE — Transfer of Care (Signed)
Immediate Anesthesia Transfer of Care Note  Patient: Dominic Mendoza  Procedure(s) Performed: Procedure(s) with comments: ROBOTIC ASSISTED LAPAROSCOPIC RADICAL PROSTATECTOMY LEVEL 2 (N/A) LYMPHADENECTOMY (Bilateral) - (BIL) PELVIC   Patient Location: PACU  Anesthesia Type:General  Level of Consciousness: awake, alert  and sedated  Airway & Oxygen Therapy: Patient Spontanous Breathing and Patient connected to face mask oxygen  Post-op Assessment: Report given to PACU RN, Post -op Vital signs reviewed and stable and Patient moving all extremities X 4  Post vital signs: Reviewed and stable  Complications: No apparent anesthesia complications

## 2013-02-25 NOTE — Anesthesia Postprocedure Evaluation (Signed)
Anesthesia Post Note  Patient: Dominic Mendoza  Procedure(s) Performed: Procedure(s) (LRB): ROBOTIC ASSISTED LAPAROSCOPIC RADICAL PROSTATECTOMY LEVEL 2 (N/A) LYMPHADENECTOMY (Bilateral)  Anesthesia type: General  Patient location: PACU  Post pain: Pain level controlled  Post assessment: Post-op Vital signs reviewed  Last Vitals:  Filed Vitals:   02/25/13 1100  BP: 143/74  Pulse: 71  Temp: 36.7 C  Resp: 10    Post vital signs: Reviewed  Level of consciousness: sedated  Complications: No apparent anesthesia complications

## 2013-02-26 ENCOUNTER — Encounter (HOSPITAL_COMMUNITY): Payer: Self-pay | Admitting: Urology

## 2013-02-26 LAB — GLUCOSE, CAPILLARY
Glucose-Capillary: 123 mg/dL — ABNORMAL HIGH (ref 70–99)
Glucose-Capillary: 173 mg/dL — ABNORMAL HIGH (ref 70–99)
Glucose-Capillary: 142 mg/dL — ABNORMAL HIGH (ref 70–99)
Glucose-Capillary: 186 mg/dL — ABNORMAL HIGH (ref 70–99)

## 2013-02-26 LAB — HEMOGLOBIN AND HEMATOCRIT, BLOOD
HCT: 39.5 % (ref 39.0–52.0)
Hemoglobin: 13.6 g/dL (ref 13.0–17.0)

## 2013-02-26 MED ORDER — HYDROCODONE-ACETAMINOPHEN 5-325 MG PO TABS
1.0000 | ORAL_TABLET | Freq: Four times a day (QID) | ORAL | Status: DC | PRN
  Administered 2013-02-26: 2 via ORAL
  Filled 2013-02-26: qty 2

## 2013-02-26 MED ORDER — BISACODYL 10 MG RE SUPP
10.0000 mg | Freq: Once | RECTAL | Status: AC
  Administered 2013-02-26: 10 mg via RECTAL
  Filled 2013-02-26: qty 1

## 2013-02-26 NOTE — Progress Notes (Signed)
Pt and wife educated on catheter care. Given leg bag and clean drainage bag. All questions answered. Julio Sicks RN

## 2013-02-26 NOTE — Care Management Note (Signed)
    Page 1 of 1   02/26/2013     12:39:22 PM   CARE MANAGEMENT NOTE 02/26/2013  Patient:  Dominic Mendoza, Dominic Mendoza   Account Number:  [id number]  Date Initiated:  02/26/2013  Documentation initiated by:  Lanier Clam  Subjective/Objective Assessment:   ADMITTED W/PROSTATE CA.     Action/Plan:   FROM HOME W/SPOUSE.HAS PCP,PHARMACY.   Anticipated DC Date:  02/26/2013   Anticipated DC Plan:  HOME/SELF CARE      DC Planning Services  CM consult      Choice offered to / List presented to:             Status of service:  Completed, signed off Medicare Important Message given?   (If response is "NO", the following Medicare IM given date fields will be blank) Date Medicare IM given:   Date Additional Medicare IM given:    Discharge Disposition:  HOME/SELF CARE  Per UR Regulation:  Reviewed for med. necessity/level of care/duration of stay  If discussed at Long Length of Stay Meetings, dates discussed:    Comments:  02/26/13 Iqra Rotundo RN,BSN NCM [phone] POD#1 RADICAL PROSTATECTOMY.D/C HOME NO D/C NEEDS OR ORDERS.

## 2013-02-26 NOTE — Progress Notes (Signed)
Patient ID: Dominic Mendoza, male   DOB: 02/08/1951, 62 y.o.   MRN: [id number] 1 Day Post-Op Subjective: The patient is doing well.  No nausea or vomiting. Pain is adequately controlled.  Objective: Vital signs in last 24 hours: Temp:  [97.8 F (36.6 C)-98.9 F (37.2 C)] 98.3 F (36.8 C) (04/29 0409) Pulse Rate:  [59-76] 62 (04/29 0409) Resp:  [10-28] 28 (04/29 0409) BP: (127-158)/(63-83) 127/63 mmHg (04/29 0409) SpO2:  [94 %-100 %] 94 % (04/29 0409) Weight:  [93.8 kg (206 lb 12.7 oz)] 93.8 kg (206 lb 12.7 oz) (04/28 1128)  Intake/Output from previous day: 04/28 0701 - 04/29 0700 In: 6387.5 [P.O.:480; I.V.:4807.5; IV Piggyback:1100] Out: 3650 [Urine:3375; Drains:175; Blood:100] Intake/Output this shift:    Physical Exam:  General: Alert and oriented. CV: RRR Lungs: Clear bilaterally. GI: Soft, Nondistended. Incisions: Dressings intact. Urine: Clear Extremities: Nontender, no erythema, no edema.  Lab Results:  Recent Labs  02/25/13 1055 02/26/13 0442  HGB 15.1 13.6  HCT 43.7 39.5      Assessment/Plan: POD# 1 s/p robotic prostatectomy.  1) SL IVF 2) Ambulate, Incentive spirometry 3) Transition to oral pain medication 4) Dulcolax suppository 5) D/C pelvic drain 6) Plan for likely discharge later today   Moody Bruins. MD   LOS: 1 day   Morry Veiga,LES 02/26/2013, 7:38 AM

## 2013-02-26 NOTE — Discharge Summary (Signed)
  Date of admission: 02/25/2013  Date of discharge: 02/26/2013  Admission diagnosis: Prostate Cancer  Discharge diagnosis: Prostate Cancer  History and Physical: For full details, please see admission history and physical. Briefly, Dominic Mendoza is a 62 y.o. gentleman with localized prostate cancer.  After discussing management/treatment options, he elected to proceed with surgical treatment.  Hospital Course: Dominic Mendoza was taken to the operating room on 02/25/2013 and underwent a robotic assisted laparoscopic radical prostatectomy. He tolerated this procedure well and without complications. Postoperatively, he was able to be transferred to a regular hospital room following recovery from anesthesia.  He was able to begin ambulating the night of surgery. He remained hemodynamically stable overnight.  He had excellent urine output with appropriately minimal output from his pelvic drain and his pelvic drain was removed on POD #1.  He was transitioned to oral pain medication, tolerated a clear liquid diet, and had met all discharge criteria and was able to be discharged home later on POD#1.  Laboratory values:  Recent Labs  02/25/13 1055 02/26/13 0442  HGB 15.1 13.6  HCT 43.7 39.5    Disposition: Home  Discharge instruction: He was instructed to be ambulatory but to refrain from heavy lifting, strenuous activity, or driving. He was instructed on urethral catheter care.  Discharge medications:     Medication List    STOP taking these medications       GOODY HEADACHE PO     ibuprofen 200 MG tablet  Commonly known as:  ADVIL,MOTRIN     naproxen sodium 220 MG tablet  Commonly known as:  ANAPROX      TAKE these medications       acetaminophen 500 MG tablet  Commonly known as:  TYLENOL  Take 500 mg by mouth every 6 (six) hours as needed for pain.     amLODipine-benazepril 5-10 MG per capsule  Commonly known as:  LOTREL  Take 1 capsule by mouth daily before breakfast.     atorvastatin 40 MG tablet  Commonly known as:  LIPITOR  Take 40 mg by mouth at bedtime.     ciprofloxacin 500 MG tablet  Commonly known as:  CIPRO  Take 1 tablet (500 mg total) by mouth 2 (two) times daily. Start day prior to office visit for foley removal     glipiZIDE 10 MG tablet  Commonly known as:  GLUCOTROL  Take 10 mg by mouth 2 (two) times daily before a meal.     HYDROcodone-acetaminophen 5-325 MG per tablet  Commonly known as:  NORCO  Take 1-2 tablets by mouth every 6 (six) hours as needed for pain.     metFORMIN 500 MG tablet  Commonly known as:  GLUCOPHAGE  Take 1,000 mg by mouth 2 (two) times daily with a meal.        Followup: He will followup in 1 week for catheter removal and to discuss his surgical pathology results.

## 2014-11-14 ENCOUNTER — Ambulatory Visit (HOSPITAL_COMMUNITY)
Admission: RE | Admit: 2014-11-14 | Discharge: 2014-11-14 | Disposition: A | Discharge status: Home / Self Care | Payer: BC Managed Care – PPO | Source: Ambulatory Visit | Attending: Adult Health Nurse Practitioner | Admitting: Adult Health Nurse Practitioner

## 2014-11-14 ENCOUNTER — Other Ambulatory Visit (HOSPITAL_COMMUNITY): Payer: Self-pay | Admitting: Adult Health Nurse Practitioner

## 2014-11-14 DIAGNOSIS — M79662 Pain in left lower leg: Secondary | ICD-10-CM | POA: Insufficient documentation

## 2014-11-14 DIAGNOSIS — R252 Cramp and spasm: Secondary | ICD-10-CM | POA: Insufficient documentation

## 2015-12-16 ENCOUNTER — Ambulatory Visit (HOSPITAL_COMMUNITY)
Admission: RE | Admit: 2015-12-16 | Discharge: 2015-12-16 | Disposition: A | Discharge status: Home / Self Care | Payer: 59 | Source: Ambulatory Visit | Attending: Family Medicine | Admitting: Family Medicine

## 2015-12-16 ENCOUNTER — Other Ambulatory Visit (HOSPITAL_COMMUNITY): Payer: Self-pay | Admitting: Family Medicine

## 2015-12-16 DIAGNOSIS — R05 Cough: Secondary | ICD-10-CM

## 2015-12-16 DIAGNOSIS — R059 Cough, unspecified: Secondary | ICD-10-CM

## 2016-08-02 ENCOUNTER — Encounter (INDEPENDENT_AMBULATORY_CARE_PROVIDER_SITE_OTHER): Payer: Self-pay | Admitting: Family Medicine

## 2016-08-02 DIAGNOSIS — Z024 Encounter for examination for driving license: Secondary | ICD-10-CM

## 2016-08-02 NOTE — Progress Notes (Signed)
Erroneous

## 2018-04-24 ENCOUNTER — Encounter: Payer: Self-pay | Admitting: Gastroenterology

## 2018-09-23 ENCOUNTER — Other Ambulatory Visit: Payer: Self-pay

## 2018-09-23 ENCOUNTER — Encounter (HOSPITAL_COMMUNITY): Payer: Self-pay | Admitting: Emergency Medicine

## 2018-09-23 ENCOUNTER — Emergency Department (HOSPITAL_COMMUNITY)
Admission: EM | Admit: 2018-09-23 | Discharge: 2018-09-23 | Disposition: A | Discharge status: Home / Self Care | Payer: Medicare Other | Attending: Emergency Medicine | Admitting: Emergency Medicine

## 2018-09-23 DIAGNOSIS — Z79899 Other long term (current) drug therapy: Secondary | ICD-10-CM | POA: Insufficient documentation

## 2018-09-23 DIAGNOSIS — C61 Malignant neoplasm of prostate: Secondary | ICD-10-CM | POA: Insufficient documentation

## 2018-09-23 DIAGNOSIS — R319 Hematuria, unspecified: Secondary | ICD-10-CM | POA: Insufficient documentation

## 2018-09-23 DIAGNOSIS — E119 Type 2 diabetes mellitus without complications: Secondary | ICD-10-CM | POA: Insufficient documentation

## 2018-09-23 DIAGNOSIS — I10 Essential (primary) hypertension: Secondary | ICD-10-CM | POA: Insufficient documentation

## 2018-09-23 DIAGNOSIS — Z7982 Long term (current) use of aspirin: Secondary | ICD-10-CM | POA: Insufficient documentation

## 2018-09-23 DIAGNOSIS — R310 Gross hematuria: Secondary | ICD-10-CM

## 2018-09-23 DIAGNOSIS — F17210 Nicotine dependence, cigarettes, uncomplicated: Secondary | ICD-10-CM | POA: Insufficient documentation

## 2018-09-23 LAB — URINALYSIS, ROUTINE W REFLEX MICROSCOPIC
Bacteria, UA: NONE SEEN
Bilirubin Urine: NEGATIVE
Glucose, UA: 150 mg/dL — AB
Ketones, ur: NEGATIVE mg/dL
Leukocytes, UA: NEGATIVE
Nitrite: NEGATIVE
Protein, ur: 100 mg/dL — AB
RBC / HPF: >50 RBC/hpf — ABNORMAL HIGH (ref 0–5)
Specific Gravity, Urine: 1.025 (ref 1.005–1.030)
pH: 6 (ref 5.0–8.0)

## 2018-09-23 NOTE — Discharge Instructions (Signed)
Return to the emergency department for pain when EP fever or inability to urinate

## 2018-09-23 NOTE — ED Provider Notes (Signed)
[facility] DEPT Provider Note   CSN: [id number] Arrival date & time: 09/23/18  1604     History   Chief Complaint Chief Complaint  Patient presents with  . Penile Discharge    HPI Dominic Mendoza is a 67 y.o. male.  67 yo M with a chief complaints of hematuria.  This started immediately after he did an intra-penile injection for erectile dysfunction.  He denies any dysuria denies flank pain.  Denies hematuria prior to the event.  He thinks it has cleared up a little bit.  Denies fevers.  The history is provided by the patient.  Penile Discharge  This is a new problem. The current episode started 2 days ago. The problem occurs constantly. The problem has not changed since onset.Pertinent negatives include no chest pain, no abdominal pain, no headaches and no shortness of breath. Nothing aggravates the symptoms. Nothing relieves the symptoms. He has tried nothing for the symptoms. The treatment provided no relief.    Past Medical History:  Diagnosis Date  . Arthritis   . Cancer [facility])    prostate  . Diabetes mellitus without complication ([place])   . GERD (gastroesophageal reflux disease)   . Hypertension   . Smokers' cough ([place])     There are no active problems to display for this patient.   Past Surgical History:  Procedure Laterality Date  . COLONOSCOPY    . LYMPHADENECTOMY Bilateral 02/25/2013   Procedure: LYMPHADENECTOMY;  Surgeon: Dutch Gray, MD;  Location: [facility] ORS;  Service: Urology;  Laterality: Bilateral;  Bilateral Pelvic   . ROBOT ASSISTED LAPAROSCOPIC RADICAL PROSTATECTOMY N/A 02/25/2013   Procedure: ROBOTIC ASSISTED LAPAROSCOPIC RADICAL PROSTATECTOMY LEVEL 2;  Surgeon: Dutch Gray, MD;  Location: [facility] OR[facility];  Service: Urology;  Laterality: N/A;        Home Medications    Prior to Admission medications   Medication Sig Start Date End Date Taking? Authorizing Provider  amLODipine-benazepril (LOTREL) 5-10 MG per capsule Take 1 capsule  by mouth daily before breakfast.   Yes [provider]  aspirin 81 MG chewable tablet Chew 81 mg by mouth daily.   Yes [provider]  clopidogrel (PLAVIX) 75 MG tablet Take 75 mg by mouth daily. 04/16/18  Yes [provider]  glipiZIDE (GLUCOTROL) 10 MG tablet Take 10 mg by mouth 2 (two) times daily before a meal.   Yes [provider]  ibuprofen (ADVIL,MOTRIN) 200 MG tablet Take 400 mg by mouth every 6 (six) hours as needed for mild pain.   Yes [provider]  metFORMIN (GLUCOPHAGE) 500 MG tablet Take 1,000 mg by mouth 2 (two) times daily with a meal.   Yes [provider]  rosuvastatin (CRESTOR) 10 MG tablet Take 10 mg by mouth at bedtime.   Yes [provider]  ciprofloxacin (CIPRO) 500 MG tablet Take 1 tablet (500 mg total) by mouth 2 (two) times daily. Start day prior to office visit for foley removal Patient not taking: Reported on 09/23/2018 02/25/13   [name], PA-C  HYDROcodone-acetaminophen (NORCO) 5-325 MG per tablet Take 1-2 tablets by mouth every 6 (six) hours as needed for pain. Patient not taking: Reported on 09/23/2018 02/25/13   [name], PA-C    Family History No family history on file.  Social History Social History   Tobacco Use  . Smoking status: Heavy Tobacco Smoker    Packs/day: 2.00    Years: 31.00    Pack years: 62.00    Types:  Cigarettes  . Smokeless tobacco: Never Used  Substance Use Topics  . Alcohol use: No  . Drug use: No     Allergies   Shrimp [shellfish allergy]   Review of Systems Review of Systems  Constitutional: Negative for chills and fever.  HENT: Negative for congestion and facial swelling.   Eyes: Negative for discharge and visual disturbance.  Respiratory: Negative for shortness of breath.   Cardiovascular: Negative for chest pain and palpitations.  Gastrointestinal: Negative for abdominal pain, diarrhea and vomiting.  Genitourinary: Positive for discharge.    Musculoskeletal: Negative for arthralgias and myalgias.  Skin: Negative for color change and rash.  Neurological: Negative for tremors, syncope and headaches.  Psychiatric/Behavioral: Negative for confusion and dysphoric mood.     Physical Exam Updated Vital Signs BP (!) 190/89 (BP Location: Right Arm)   Pulse 71   Temp 97.7 F (36.5 C) (Oral)   Resp 16   SpO2 98%   Physical Exam  Constitutional: He is oriented to person, place, and time. He appears well-developed and well-nourished.  HENT:  Head: Normocephalic and atraumatic.  Eyes: Pupils are equal, round, and reactive to light. EOM are normal.  Neck: Normal range of motion. Neck supple. No JVD present.  Cardiovascular: Normal rate and regular rhythm. Exam reveals no gallop and no friction rub.  No murmur heard. Pulmonary/Chest: No respiratory distress. He has no wheezes.  Abdominal: He exhibits no distension and no mass. There is no tenderness. There is no rebound and no guarding.  Genitourinary: Circumcised.     Musculoskeletal: Normal range of motion.  Neurological: He is alert and oriented to person, place, and time.  Skin: No rash noted. No pallor.  Psychiatric: He has a normal mood and affect. His behavior is normal.  Nursing note and vitals reviewed.    ED Treatments / Results  Labs (all labs ordered are listed, but only abnormal results are displayed) Labs Reviewed  URINALYSIS, ROUTINE W REFLEX MICROSCOPIC - Abnormal; Notable for the following components:      Result Value   APPearance HAZY (*)    Glucose, UA 150 (*)    Hgb urine dipstick LARGE (*)    Protein, ur 100 (*)    RBC / HPF >50 (*)    All other components within normal limits  URINE CULTURE    EKG None  Radiology No results found.  Procedures Procedures (including critical care time)  Medications Ordered in ED Medications - No data to display   Initial Impression / Assessment and Plan / ED Course  I have reviewed the triage  vital signs and the nursing notes.  Pertinent labs & imaging results that were available during my care of the patient were reviewed by me and considered in my medical decision making (see chart for details).     67 yo M with a chief complaints of hematuria post penile injection.  Patient feels that he has cleaned up a little bit.  I will discuss with the urologist.  Discussed with Dr. Carlean Purl, she felt that if the patient was not showing signs of a penile fracture was able to empty his bladder without difficulty should be okay to call his urologist in the morning.  Post void residual 22.  D/c home  7:58 PM:  I have discussed the diagnosis/risks/treatment options with the patient and family and believe the pt to be eligible for discharge home to follow-up with PCP. We also discussed returning to the ED immediately if new or worsening  sx occur. We discussed the sx which are most concerning (e.g., sudden worsening pain, fever, inability to tolerate by mouth) that necessitate immediate return. Medications administered to the patient during their visit and any new prescriptions provided to the patient are listed below.  Medications given during this visit Medications - No data to display   The patient appears reasonably screen and/or stabilized for discharge and I doubt any other medical condition or other [facility] requiring further screening, evaluation, or treatment in the ED at this time prior to discharge.    Final Clinical Impressions(s) / ED Diagnoses   Final diagnoses:  Gross hematuria    ED Discharge Orders    None       Deno Etienne, DO 09/23/18 1958

## 2018-09-23 NOTE — ED Triage Notes (Signed)
Patient reports hx prostate cancer. Reports "I have to give myself an injection to get an erection and when I was injecting about fifteen minutes ago I noticed blood coming out of my penis."

## 2018-09-25 LAB — URINE CULTURE: Culture: NO GROWTH

## 2021-03-17 ENCOUNTER — Encounter (HOSPITAL_COMMUNITY): Payer: Self-pay

## 2021-03-17 ENCOUNTER — Inpatient Hospital Stay (HOSPITAL_COMMUNITY)
Admission: EM | Admit: 2021-03-17 | Discharge: 2021-03-22 | DRG: 522 | Disposition: A | Discharge status: Rehabilitation Facility | Payer: Medicare Other | Attending: Internal Medicine | Admitting: Internal Medicine

## 2021-03-17 ENCOUNTER — Emergency Department (HOSPITAL_COMMUNITY): Payer: Medicare Other

## 2021-03-17 ENCOUNTER — Other Ambulatory Visit: Payer: Self-pay

## 2021-03-17 DIAGNOSIS — I4510 Unspecified right bundle-branch block: Secondary | ICD-10-CM | POA: Diagnosis present

## 2021-03-17 DIAGNOSIS — S72002A Fracture of unspecified part of neck of left femur, initial encounter for closed fracture: Secondary | ICD-10-CM

## 2021-03-17 DIAGNOSIS — E119 Type 2 diabetes mellitus without complications: Secondary | ICD-10-CM

## 2021-03-17 DIAGNOSIS — D649 Anemia, unspecified: Secondary | ICD-10-CM

## 2021-03-17 DIAGNOSIS — N289 Disorder of kidney and ureter, unspecified: Secondary | ICD-10-CM

## 2021-03-17 DIAGNOSIS — Y92512 Supermarket, store or market as the place of occurrence of the external cause: Secondary | ICD-10-CM

## 2021-03-17 DIAGNOSIS — E871 Hypo-osmolality and hyponatremia: Secondary | ICD-10-CM | POA: Diagnosis present

## 2021-03-17 DIAGNOSIS — Z91013 Allergy to seafood: Secondary | ICD-10-CM

## 2021-03-17 DIAGNOSIS — Z953 Presence of xenogenic heart valve: Secondary | ICD-10-CM

## 2021-03-17 DIAGNOSIS — Z951 Presence of aortocoronary bypass graft: Secondary | ICD-10-CM

## 2021-03-17 DIAGNOSIS — S72012A Unspecified intracapsular fracture of left femur, initial encounter for closed fracture: Secondary | ICD-10-CM | POA: Diagnosis not present

## 2021-03-17 DIAGNOSIS — Z419 Encounter for procedure for purposes other than remedying health state, unspecified: Secondary | ICD-10-CM

## 2021-03-17 DIAGNOSIS — D72828 Other elevated white blood cell count: Secondary | ICD-10-CM | POA: Diagnosis present

## 2021-03-17 DIAGNOSIS — Z7982 Long term (current) use of aspirin: Secondary | ICD-10-CM

## 2021-03-17 DIAGNOSIS — I2510 Atherosclerotic heart disease of native coronary artery without angina pectoris: Secondary | ICD-10-CM | POA: Diagnosis present

## 2021-03-17 DIAGNOSIS — Z7902 Long term (current) use of antithrombotics/antiplatelets: Secondary | ICD-10-CM

## 2021-03-17 DIAGNOSIS — I10 Essential (primary) hypertension: Secondary | ICD-10-CM | POA: Diagnosis present

## 2021-03-17 DIAGNOSIS — E1151 Type 2 diabetes mellitus with diabetic peripheral angiopathy without gangrene: Secondary | ICD-10-CM | POA: Diagnosis present

## 2021-03-17 DIAGNOSIS — K219 Gastro-esophageal reflux disease without esophagitis: Secondary | ICD-10-CM | POA: Diagnosis present

## 2021-03-17 DIAGNOSIS — Z79899 Other long term (current) drug therapy: Secondary | ICD-10-CM

## 2021-03-17 DIAGNOSIS — Z20822 Contact with and (suspected) exposure to covid-19: Secondary | ICD-10-CM | POA: Diagnosis present

## 2021-03-17 DIAGNOSIS — Z9079 Acquired absence of other genital organ(s): Secondary | ICD-10-CM

## 2021-03-17 DIAGNOSIS — N182 Chronic kidney disease, stage 2 (mild): Secondary | ICD-10-CM | POA: Diagnosis present

## 2021-03-17 DIAGNOSIS — E1122 Type 2 diabetes mellitus with diabetic chronic kidney disease: Secondary | ICD-10-CM | POA: Diagnosis present

## 2021-03-17 DIAGNOSIS — E1165 Type 2 diabetes mellitus with hyperglycemia: Secondary | ICD-10-CM | POA: Diagnosis present

## 2021-03-17 DIAGNOSIS — Z8673 Personal history of transient ischemic attack (TIA), and cerebral infarction without residual deficits: Secondary | ICD-10-CM

## 2021-03-17 DIAGNOSIS — I129 Hypertensive chronic kidney disease with stage 1 through stage 4 chronic kidney disease, or unspecified chronic kidney disease: Secondary | ICD-10-CM | POA: Diagnosis present

## 2021-03-17 DIAGNOSIS — W010XXA Fall on same level from slipping, tripping and stumbling without subsequent striking against object, initial encounter: Secondary | ICD-10-CM | POA: Diagnosis present

## 2021-03-17 DIAGNOSIS — F17210 Nicotine dependence, cigarettes, uncomplicated: Secondary | ICD-10-CM | POA: Diagnosis present

## 2021-03-17 DIAGNOSIS — Z8546 Personal history of malignant neoplasm of prostate: Secondary | ICD-10-CM

## 2021-03-17 DIAGNOSIS — N135 Crossing vessel and stricture of ureter without hydronephrosis: Secondary | ICD-10-CM | POA: Diagnosis present

## 2021-03-17 DIAGNOSIS — Z7984 Long term (current) use of oral hypoglycemic drugs: Secondary | ICD-10-CM

## 2021-03-17 DIAGNOSIS — S50312A Abrasion of left elbow, initial encounter: Secondary | ICD-10-CM | POA: Diagnosis present

## 2021-03-17 DIAGNOSIS — D62 Acute posthemorrhagic anemia: Secondary | ICD-10-CM | POA: Diagnosis not present

## 2021-03-17 DIAGNOSIS — K5900 Constipation, unspecified: Secondary | ICD-10-CM | POA: Diagnosis present

## 2021-03-17 HISTORY — DX: Dyspnea, unspecified: R06.00

## 2021-03-17 LAB — CBC WITH DIFFERENTIAL/PLATELET
Abs Immature Granulocytes: 0.12 10*3/uL — ABNORMAL HIGH (ref 0.00–0.07)
Basophils Absolute: 0 10*3/uL (ref 0.0–0.1)
Basophils Relative: 0 %
Eosinophils Absolute: 0.3 10*3/uL (ref 0.0–0.5)
Eosinophils Relative: 2 %
HCT: 32.8 % — ABNORMAL LOW (ref 39.0–52.0)
Hemoglobin: 10.7 g/dL — ABNORMAL LOW (ref 13.0–17.0)
Immature Granulocytes: 1 %
Lymphocytes Relative: 14 %
Lymphs Abs: 1.5 10*3/uL (ref 0.7–4.0)
MCH: 29.8 pg (ref 26.0–34.0)
MCHC: 32.6 g/dL (ref 30.0–36.0)
MCV: 91.4 fL (ref 80.0–100.0)
Monocytes Absolute: 0.7 10*3/uL (ref 0.1–1.0)
Monocytes Relative: 6 %
Neutro Abs: 8.5 10*3/uL — ABNORMAL HIGH (ref 1.7–7.7)
Neutrophils Relative %: 77 %
Platelets: 165 10*3/uL (ref 150–400)
RBC: 3.59 MIL/uL — ABNORMAL LOW (ref 4.22–5.81)
RDW: 14.3 % (ref 11.5–15.5)
WBC: 11.1 10*3/uL — ABNORMAL HIGH (ref 4.0–10.5)
nRBC: 0 % (ref 0.0–0.2)

## 2021-03-17 LAB — COMPREHENSIVE METABOLIC PANEL
ALT: 13 U/L (ref 0–44)
AST: 13 U/L — ABNORMAL LOW (ref 15–41)
Albumin: 3.9 g/dL (ref 3.5–5.0)
Alkaline Phosphatase: 83 U/L (ref 38–126)
Anion gap: 8 (ref 5–15)
BUN: 24 mg/dL — ABNORMAL HIGH (ref 8–23)
CO2: 28 mmol/L (ref 22–32)
Calcium: 9.4 mg/dL (ref 8.9–10.3)
Chloride: 103 mmol/L (ref 98–111)
Creatinine, Ser: 1.26 mg/dL — ABNORMAL HIGH (ref 0.61–1.24)
GFR, Estimated: >60 mL/min (ref >60)
Glucose, Bld: 183 mg/dL — ABNORMAL HIGH (ref 70–99)
Potassium: 4.4 mmol/L (ref 3.5–5.1)
Sodium: 139 mmol/L (ref 135–145)
Total Bilirubin: 0.5 mg/dL (ref 0.3–1.2)
Total Protein: 7 g/dL (ref 6.5–8.1)

## 2021-03-17 MED ORDER — MORPHINE SULFATE (PF) 4 MG/ML IV SOLN
4.0000 mg | Freq: Once | INTRAVENOUS | Status: AC
  Administered 2021-03-17: 4 mg via INTRAVENOUS
  Filled 2021-03-17: qty 1

## 2021-03-17 NOTE — ED Notes (Signed)
Patients left elbow and left knee cap wrapped with Xeroform gauze. Both are clean, dry, intact.

## 2021-03-17 NOTE — ED Triage Notes (Addendum)
Patient BIB [facility] from home. Had a fall carrying a pack of water at the [employer]. Has abrasions to left elbow and left knee. Sharp pain in the left thigh. Patient is on Plavix. No head injury no LOC. Patient had a stent put in left leg in 2016. Patient has diabetes and hypertension.

## 2021-03-17 NOTE — ED Provider Notes (Addendum)
Care assumed from Dr. Roderic Palau, patient with hip pain following fall and equivocal plain films pending CT scan.  CT scan does show evidence of a nondisplaced femoral neck fracture.  Case is discussed with Dr. [name], on-call for orthopedics, who agrees to see the patient in consultation and will  arrange for operative management.  Case is discussed with Dr. Myna Hidalgo of [facility] hospitalist, who agrees to admit the patient.  Results for orders placed or performed during the hospital encounter of 03/17/21  Resp Panel by RT-PCR (Flu A&B, Covid) Nasopharyngeal Swab   Specimen: Nasopharyngeal Swab; Nasopharyngeal(NP) swabs in vial transport medium  Result Value Ref Range   SARS Coronavirus 2 by RT PCR NEGATIVE NEGATIVE   Influenza A by PCR NEGATIVE NEGATIVE   Influenza B by PCR NEGATIVE NEGATIVE  CBC with Differential/Platelet  Result Value Ref Range   WBC 11.1 (H) 4.0 - 10.5 K/uL   RBC 3.59 (L) 4.22 - 5.81 MIL/uL   Hemoglobin 10.7 (L) 13.0 - 17.0 g/dL   HCT 32.8 (L) 39.0 - 52.0 %   MCV 91.4 80.0 - 100.0 fL   MCH 29.8 26.0 - 34.0 pg   MCHC 32.6 30.0 - 36.0 g/dL   RDW 14.3 11.5 - 15.5 %   Platelets 165 150 - 400 K/uL   nRBC 0.0 0.0 - 0.2 %   Neutrophils Relative % 77 %   Neutro Abs 8.5 (H) 1.7 - 7.7 K/uL   Lymphocytes Relative 14 %   Lymphs Abs 1.5 0.7 - 4.0 K/uL   Monocytes Relative 6 %   Monocytes Absolute 0.7 0.1 - 1.0 K/uL   Eosinophils Relative 2 %   Eosinophils Absolute 0.3 0.0 - 0.5 K/uL   Basophils Relative 0 %   Basophils Absolute 0.0 0.0 - 0.1 K/uL   Immature Granulocytes 1 %   Abs Immature Granulocytes 0.12 (H) 0.00 - 0.07 K/uL  Comprehensive metabolic panel  Result Value Ref Range   Sodium 139 135 - 145 mmol/L   Potassium 4.4 3.5 - 5.1 mmol/L   Chloride 103 98 - 111 mmol/L   CO2 28 22 - 32 mmol/L   Glucose, Bld 183 (H) 70 - 99 mg/dL   BUN 24 (H) 8 - 23 mg/dL   Creatinine, Ser 1.26 (H) 0.61 - 1.24 mg/dL   Calcium 9.4 8.9 - 10.3 mg/dL   Total Protein 7.0 6.5 - 8.1 g/dL    Albumin 3.9 3.5 - 5.0 g/dL   AST 13 (L) 15 - 41 U/L   ALT 13 0 - 44 U/L   Alkaline Phosphatase 83 38 - 126 U/L   Total Bilirubin 0.5 0.3 - 1.2 mg/dL   GFR, Estimated >60 >60 mL/min   Anion gap 8 5 - 15   DG Ribs Unilateral W/Chest Left  Result Date: 03/17/2021 CLINICAL DATA:  Recent fall with left-sided chest pain, initial encounter EXAM: LEFT RIBS AND CHEST - 3+ VIEW COMPARISON:  12/16/2015 FINDINGS: Heart is at the upper limits of normal in size. Postsurgical changes are seen. Aortic calcifications are noted. The lungs are well aerated bilaterally with mild left lung atelectatic changes. No definitive rib fractures are seen. No pneumothorax is noted. IMPRESSION: No acute rib fracture is noted.  Mild left lung atelectasis is seen. Electronically Signed   By: Inez Catalina M.D.   On: 03/17/2021 22:37   CT Hip Left Wo Contrast  Result Date: 03/18/2021 CLINICAL DATA:  Fracture, hip EXAM: CT OF THE LEFT HIP WITHOUT CONTRAST TECHNIQUE: Multidetector CT imaging of  the left hip was performed according to the standard protocol. Multiplanar CT image reconstructions were also generated. COMPARISON:  Same day hip and femur radiographs. FINDINGS: Bones/Joint/Cartilage Nondisplaced subcapital fracture of the proximal femur. Femoral head is well seated within the acetabulum with relative preservation of the joint space. Ligaments Suboptimally assessed by CT. Muscles and Tendons Within normal limits. Soft tissues Small lipohemarthrosis. IMPRESSION: 1. Nondisplaced subcapital fracture of the proximal femur. 2. Small lipohemarthrosis. Electronically Signed   By: Dahlia Bailiff MD   On: 03/18/2021 00:37   DG Chest Port 1 View  Result Date: 03/18/2021 CLINICAL DATA:  Pre-op respiratory exam EXAM: PORTABLE CHEST 1 VIEW COMPARISON:  Mar 17, 2021. FINDINGS: Median sternotomy wires. The heart size and mediastinal contours are unchanged. Low lung volumes with bibasilar atelectasis. No visible pleural effusion or  pneumothorax. The visualized skeletal structures are stable. IMPRESSION: Low lung volumes with bibasilar atelectasis. Electronically Signed   By: Dahlia Bailiff MD   On: 03/18/2021 01:09   DG Hip Unilat W or Wo Pelvis 2-3 Views Left  Result Date: 03/17/2021 CLINICAL DATA:  Recent fall with left hip pain, initial encounter EXAM: DG HIP (WITH OR WITHOUT PELVIS) 3V LEFT COMPARISON:  None. FINDINGS: Pelvic ring is intact. Mild irregularity is noted at the junction of the femoral neck and femoral head which may be related to an undisplaced fracture. CT would be helpful for further evaluation. IMPRESSION: Findings suspicious for subcapital femoral neck fracture. CT would be helpful for further evaluation. Electronically Signed   By: Inez Catalina M.D.   On: 03/17/2021 22:36   DG Femur Min 2 Views Left  Result Date: 03/17/2021 CLINICAL DATA:  Recent fall with left leg pain, initial encounter EXAM: LEFT FEMUR 2 VIEWS COMPARISON:  None. FINDINGS: SFA stenting is seen. Irregularity in the subcapital femoral neck is again seen suspicious for fracture. More distal femur appears within normal limits. IMPRESSION: Irregularity in the subcapital femoral neck suspicious for undisplaced fracture. Electronically Signed   By: Inez Catalina M.D.   On: 03/17/2021 22:36   Images viewed by me.    Delora Fuel, MD 09/02/14 [id number]    Delora Fuel, MD 59/29/24 [phone]

## 2021-03-17 NOTE — ED Notes (Signed)
Wound care performed to patients left knee and left elbow abrasions.

## 2021-03-17 NOTE — ED Provider Notes (Signed)
[facility] DEPT Provider Note   CSN: [id number] Arrival date & time: 03/17/21  2049     History Chief Complaint  Patient presents with  . Fall    Dominic Mendoza is a 70 y.o. male.  Patient states he tripped and fell landed on his left hip and left side of his chest.  Also fell on his left elbow patient did not hit his head.  Patient had heart bypass surgery done last fall he also has a stent in his left leg  The history is provided by the patient and medical records. No language interpreter was used.  Fall This is a new problem. The current episode started 6 to 12 hours ago. The problem occurs constantly. The problem has been resolved. Pertinent negatives include no chest pain, no abdominal pain and no headaches. Nothing aggravates the symptoms. Nothing relieves the symptoms. He has tried nothing for the symptoms.       Past Medical History:  Diagnosis Date  . Arthritis   . Cancer [facility])    prostate  . Diabetes mellitus without complication ([place])   . GERD (gastroesophageal reflux disease)   . Hypertension   . Smokers' cough ([place])     There are no problems to display for this patient.   Past Surgical History:  Procedure Laterality Date  . COLONOSCOPY    . LYMPHADENECTOMY Bilateral 02/25/2013   Procedure: LYMPHADENECTOMY;  Surgeon: Dutch Gray, MD;  Location: [facility];  Service: Urology;  Laterality: Bilateral;  Bilateral Pelvic   . ROBOT ASSISTED LAPAROSCOPIC RADICAL PROSTATECTOMY N/A 02/25/2013   Procedure: ROBOTIC ASSISTED LAPAROSCOPIC RADICAL PROSTATECTOMY LEVEL 2;  Surgeon: Dutch Gray, MD;  Location: [facility];  Service: Urology;  Laterality: N/A;       History reviewed. No pertinent family history.  Social History   Tobacco Use  . Smoking status: Heavy Tobacco Smoker    Packs/day: 2.00    Years: 31.00    Pack years: 62.00    Types: Cigarettes  . Smokeless tobacco: Never Used  Substance Use Topics  . Alcohol use: No  . Drug use:  No    Home Medications Prior to Admission medications   Medication Sig Start Date End Date Taking? Authorizing Provider  amLODipine-benazepril (LOTREL) 5-10 MG per capsule Take 1 capsule by mouth daily before breakfast.    [provider]  aspirin 81 MG chewable tablet Chew 81 mg by mouth daily.    [provider]  ciprofloxacin (CIPRO) 500 MG tablet Take 1 tablet (500 mg total) by mouth 2 (two) times daily. Start day prior to office visit for foley removal Patient not taking: Reported on 09/23/2018 02/25/13   [name], PA-C  clopidogrel (PLAVIX) 75 MG tablet Take 75 mg by mouth daily. 04/16/18   [provider]  glipiZIDE (GLUCOTROL) 10 MG tablet Take 10 mg by mouth 2 (two) times daily before a meal.    [provider]  HYDROcodone-acetaminophen (NORCO) 5-325 MG per tablet Take 1-2 tablets by mouth every 6 (six) hours as needed for pain. Patient not taking: Reported on 09/23/2018 02/25/13   [name], PA-C  ibuprofen (ADVIL,MOTRIN) 200 MG tablet Take 400 mg by mouth every 6 (six) hours as needed for mild pain.    [provider]  metFORMIN (GLUCOPHAGE) 500 MG tablet Take 1,000 mg by mouth 2 (two) times daily with a meal.    [provider]  rosuvastatin (CRESTOR) 10 MG tablet Take 10 mg by mouth at bedtime.  [provider]    Allergies    Shrimp [shellfish allergy]  Review of Systems   Review of Systems  Constitutional: Negative for appetite change and fatigue.  HENT: Negative for congestion, ear discharge and sinus pressure.   Eyes: Negative for discharge.  Respiratory: Negative for cough.   Cardiovascular: Negative for chest pain.  Gastrointestinal: Negative for abdominal pain and diarrhea.  Genitourinary: Negative for frequency and hematuria.  Musculoskeletal: Negative for back pain.       Left elbow pain and left hip pain  Skin: Negative for rash.  Neurological: Negative for seizures and headaches.   Psychiatric/Behavioral: Negative for hallucinations.    Physical Exam Updated Vital Signs BP (!) 149/59   Pulse 60   Temp 98.5 F (36.9 C) (Oral)   Resp 16   Ht 5\' 9"  (1.753 m)   Wt 93.8 kg   SpO2 95%   BMI 30.54 kg/m   Physical Exam Vitals reviewed.  Constitutional:      Appearance: He is well-developed.  HENT:     Head: Normocephalic.     Nose: Nose normal.  Eyes:     General: No scleral icterus.    Conjunctiva/sclera: Conjunctivae normal.  Neck:     Thyroid: No thyromegaly.  Cardiovascular:     Rate and Rhythm: Normal rate and regular rhythm.     Heart sounds: No murmur heard. No friction rub. No gallop.   Pulmonary:     Breath sounds: No stridor. No wheezing or rales.     Comments: Left side chest tender Chest:     Chest wall: Tenderness present.  Abdominal:     General: There is no distension.     Tenderness: There is no abdominal tenderness. There is no rebound.  Musculoskeletal:     Cervical back: Neck supple.     Comments: Tender left elbow with abrasion,   tender left hip  Lymphadenopathy:     Cervical: No cervical adenopathy.  Skin:    Findings: No erythema or rash.  Neurological:     Mental Status: He is oriented to person, place, and time.     Motor: No abnormal muscle tone.     Coordination: Coordination normal.  Psychiatric:        Behavior: Behavior normal.     ED Results / Procedures / Treatments   Labs (all labs ordered are listed, but only abnormal results are displayed) Labs Reviewed  CBC WITH DIFFERENTIAL/PLATELET  COMPREHENSIVE METABOLIC PANEL    EKG None  Radiology DG Ribs Unilateral W/Chest Left  Result Date: 03/17/2021 CLINICAL DATA:  Recent fall with left-sided chest pain, initial encounter EXAM: LEFT RIBS AND CHEST - 3+ VIEW COMPARISON:  12/16/2015 FINDINGS: Heart is at the upper limits of normal in size. Postsurgical changes are seen. Aortic calcifications are noted. The lungs are well aerated bilaterally with mild  left lung atelectatic changes. No definitive rib fractures are seen. No pneumothorax is noted. IMPRESSION: No acute rib fracture is noted.  Mild left lung atelectasis is seen. Electronically Signed   By: Inez Catalina M.D.   On: 03/17/2021 22:37   DG Hip Unilat W or Wo Pelvis 2-3 Views Left  Result Date: 03/17/2021 CLINICAL DATA:  Recent fall with left hip pain, initial encounter EXAM: DG HIP (WITH OR WITHOUT PELVIS) 3V LEFT COMPARISON:  None. FINDINGS: Pelvic ring is intact. Mild irregularity is noted at the junction of the femoral neck and femoral head which may be related to an undisplaced fracture. CT would  be helpful for further evaluation. IMPRESSION: Findings suspicious for subcapital femoral neck fracture. CT would be helpful for further evaluation. Electronically Signed   By: Inez Catalina M.D.   On: 03/17/2021 22:36   DG Femur Min 2 Views Left  Result Date: 03/17/2021 CLINICAL DATA:  Recent fall with left leg pain, initial encounter EXAM: LEFT FEMUR 2 VIEWS COMPARISON:  None. FINDINGS: SFA stenting is seen. Irregularity in the subcapital femoral neck is again seen suspicious for fracture. More distal femur appears within normal limits. IMPRESSION: Irregularity in the subcapital femoral neck suspicious for undisplaced fracture. Electronically Signed   By: Inez Catalina M.D.   On: 03/17/2021 22:36    Procedures Procedures   Medications Ordered in ED Medications - No data to display  ED Course  I have reviewed the triage vital signs and the nursing notes.  Pertinent labs & imaging results that were available during my care of the patient were reviewed by me and considered in my medical decision making (see chart for details).    MDM Rules/Calculators/A&P                         Patient with tender left hip after fall.  Patient getting a CT scan to check for fractured hip.  Dr. Roxanne Mins will disposition patient Final Clinical Impression(s) / ED Diagnoses Final diagnoses:  None    Rx /  DC Orders ED Discharge Orders    None       Milton Ferguson, MD 03/21/21 1139

## 2021-03-18 ENCOUNTER — Emergency Department (HOSPITAL_COMMUNITY): Payer: Medicare Other

## 2021-03-18 ENCOUNTER — Inpatient Hospital Stay (HOSPITAL_COMMUNITY): Payer: Medicare Other

## 2021-03-18 ENCOUNTER — Inpatient Hospital Stay (HOSPITAL_COMMUNITY): Payer: Medicare Other | Admitting: Anesthesiology

## 2021-03-18 ENCOUNTER — Encounter (HOSPITAL_COMMUNITY)
Admission: EM | Disposition: A | Discharge status: Rehabilitation Facility | Payer: Self-pay | Source: Home / Self Care | Attending: Internal Medicine

## 2021-03-18 ENCOUNTER — Encounter (HOSPITAL_COMMUNITY): Payer: Self-pay | Admitting: Family Medicine

## 2021-03-18 DIAGNOSIS — I4510 Unspecified right bundle-branch block: Secondary | ICD-10-CM | POA: Diagnosis present

## 2021-03-18 DIAGNOSIS — I2510 Atherosclerotic heart disease of native coronary artery without angina pectoris: Secondary | ICD-10-CM | POA: Diagnosis present

## 2021-03-18 DIAGNOSIS — Z8546 Personal history of malignant neoplasm of prostate: Secondary | ICD-10-CM | POA: Diagnosis not present

## 2021-03-18 DIAGNOSIS — I951 Orthostatic hypotension: Secondary | ICD-10-CM | POA: Diagnosis present

## 2021-03-18 DIAGNOSIS — Z952 Presence of prosthetic heart valve: Secondary | ICD-10-CM | POA: Diagnosis not present

## 2021-03-18 DIAGNOSIS — N182 Chronic kidney disease, stage 2 (mild): Secondary | ICD-10-CM

## 2021-03-18 DIAGNOSIS — E1151 Type 2 diabetes mellitus with diabetic peripheral angiopathy without gangrene: Secondary | ICD-10-CM | POA: Diagnosis present

## 2021-03-18 DIAGNOSIS — M7989 Other specified soft tissue disorders: Secondary | ICD-10-CM | POA: Diagnosis not present

## 2021-03-18 DIAGNOSIS — Z9079 Acquired absence of other genital organ(s): Secondary | ICD-10-CM | POA: Diagnosis not present

## 2021-03-18 DIAGNOSIS — K219 Gastro-esophageal reflux disease without esophagitis: Secondary | ICD-10-CM | POA: Diagnosis present

## 2021-03-18 DIAGNOSIS — W19XXXD Unspecified fall, subsequent encounter: Secondary | ICD-10-CM | POA: Diagnosis present

## 2021-03-18 DIAGNOSIS — S80219D Abrasion, unspecified knee, subsequent encounter: Secondary | ICD-10-CM | POA: Diagnosis not present

## 2021-03-18 DIAGNOSIS — D62 Acute posthemorrhagic anemia: Secondary | ICD-10-CM | POA: Diagnosis present

## 2021-03-18 DIAGNOSIS — I10 Essential (primary) hypertension: Secondary | ICD-10-CM

## 2021-03-18 DIAGNOSIS — Y92512 Supermarket, store or market as the place of occurrence of the external cause: Secondary | ICD-10-CM | POA: Diagnosis not present

## 2021-03-18 DIAGNOSIS — S72002A Fracture of unspecified part of neck of left femur, initial encounter for closed fracture: Secondary | ICD-10-CM

## 2021-03-18 DIAGNOSIS — Z7984 Long term (current) use of oral hypoglycemic drugs: Secondary | ICD-10-CM | POA: Diagnosis not present

## 2021-03-18 DIAGNOSIS — I129 Hypertensive chronic kidney disease with stage 1 through stage 4 chronic kidney disease, or unspecified chronic kidney disease: Secondary | ICD-10-CM | POA: Diagnosis present

## 2021-03-18 DIAGNOSIS — D72828 Other elevated white blood cell count: Secondary | ICD-10-CM | POA: Diagnosis present

## 2021-03-18 DIAGNOSIS — Z96642 Presence of left artificial hip joint: Secondary | ICD-10-CM | POA: Diagnosis present

## 2021-03-18 DIAGNOSIS — Z7902 Long term (current) use of antithrombotics/antiplatelets: Secondary | ICD-10-CM | POA: Diagnosis not present

## 2021-03-18 DIAGNOSIS — Z8673 Personal history of transient ischemic attack (TIA), and cerebral infarction without residual deficits: Secondary | ICD-10-CM

## 2021-03-18 DIAGNOSIS — Z87891 Personal history of nicotine dependence: Secondary | ICD-10-CM | POA: Diagnosis not present

## 2021-03-18 DIAGNOSIS — G8918 Other acute postprocedural pain: Secondary | ICD-10-CM | POA: Diagnosis not present

## 2021-03-18 DIAGNOSIS — E1165 Type 2 diabetes mellitus with hyperglycemia: Secondary | ICD-10-CM | POA: Diagnosis present

## 2021-03-18 DIAGNOSIS — E119 Type 2 diabetes mellitus without complications: Secondary | ICD-10-CM

## 2021-03-18 DIAGNOSIS — F05 Delirium due to known physiological condition: Secondary | ICD-10-CM | POA: Diagnosis present

## 2021-03-18 DIAGNOSIS — E871 Hypo-osmolality and hyponatremia: Secondary | ICD-10-CM | POA: Diagnosis present

## 2021-03-18 DIAGNOSIS — E1122 Type 2 diabetes mellitus with diabetic chronic kidney disease: Secondary | ICD-10-CM | POA: Diagnosis present

## 2021-03-18 DIAGNOSIS — N1831 Chronic kidney disease, stage 3a: Secondary | ICD-10-CM | POA: Diagnosis present

## 2021-03-18 DIAGNOSIS — W010XXA Fall on same level from slipping, tripping and stumbling without subsequent striking against object, initial encounter: Secondary | ICD-10-CM | POA: Diagnosis present

## 2021-03-18 DIAGNOSIS — M1990 Unspecified osteoarthritis, unspecified site: Secondary | ICD-10-CM | POA: Diagnosis present

## 2021-03-18 DIAGNOSIS — N135 Crossing vessel and stricture of ureter without hydronephrosis: Secondary | ICD-10-CM | POA: Diagnosis present

## 2021-03-18 DIAGNOSIS — Z20822 Contact with and (suspected) exposure to covid-19: Secondary | ICD-10-CM | POA: Diagnosis present

## 2021-03-18 DIAGNOSIS — Z953 Presence of xenogenic heart valve: Secondary | ICD-10-CM | POA: Diagnosis not present

## 2021-03-18 DIAGNOSIS — S72002D Fracture of unspecified part of neck of left femur, subsequent encounter for closed fracture with routine healing: Secondary | ICD-10-CM | POA: Diagnosis present

## 2021-03-18 DIAGNOSIS — Z6829 Body mass index (BMI) 29.0-29.9, adult: Secondary | ICD-10-CM | POA: Diagnosis not present

## 2021-03-18 DIAGNOSIS — Z7982 Long term (current) use of aspirin: Secondary | ICD-10-CM | POA: Diagnosis not present

## 2021-03-18 DIAGNOSIS — Z79899 Other long term (current) drug therapy: Secondary | ICD-10-CM | POA: Diagnosis not present

## 2021-03-18 DIAGNOSIS — S72012A Unspecified intracapsular fracture of left femur, initial encounter for closed fracture: Secondary | ICD-10-CM | POA: Diagnosis present

## 2021-03-18 DIAGNOSIS — N289 Disorder of kidney and ureter, unspecified: Secondary | ICD-10-CM | POA: Diagnosis present

## 2021-03-18 DIAGNOSIS — Z91013 Allergy to seafood: Secondary | ICD-10-CM | POA: Diagnosis not present

## 2021-03-18 DIAGNOSIS — F17210 Nicotine dependence, cigarettes, uncomplicated: Secondary | ICD-10-CM | POA: Diagnosis present

## 2021-03-18 DIAGNOSIS — Z951 Presence of aortocoronary bypass graft: Secondary | ICD-10-CM | POA: Diagnosis not present

## 2021-03-18 DIAGNOSIS — E669 Obesity, unspecified: Secondary | ICD-10-CM | POA: Diagnosis present

## 2021-03-18 DIAGNOSIS — S50312A Abrasion of left elbow, initial encounter: Secondary | ICD-10-CM | POA: Diagnosis present

## 2021-03-18 HISTORY — PX: TOTAL HIP ARTHROPLASTY: SHX124

## 2021-03-18 LAB — BASIC METABOLIC PANEL
Anion gap: 8 (ref 5–15)
BUN: 22 mg/dL (ref 8–23)
CO2: 23 mmol/L (ref 22–32)
Calcium: 8.9 mg/dL (ref 8.9–10.3)
Chloride: 105 mmol/L (ref 98–111)
Creatinine, Ser: 1.13 mg/dL (ref 0.61–1.24)
GFR, Estimated: >60 mL/min (ref >60)
Glucose, Bld: 169 mg/dL — ABNORMAL HIGH (ref 70–99)
Potassium: 4 mmol/L (ref 3.5–5.1)
Sodium: 136 mmol/L (ref 135–145)

## 2021-03-18 LAB — URINALYSIS, ROUTINE W REFLEX MICROSCOPIC
Bacteria, UA: NONE SEEN
Bilirubin Urine: NEGATIVE
Glucose, UA: NEGATIVE mg/dL
Ketones, ur: NEGATIVE mg/dL
Leukocytes,Ua: NEGATIVE
Nitrite: NEGATIVE
Protein, ur: NEGATIVE mg/dL
Specific Gravity, Urine: 1.015 (ref 1.005–1.030)
pH: 5 (ref 5.0–8.0)

## 2021-03-18 LAB — TYPE AND SCREEN
ABO/RH(D): A POS
Antibody Screen: NEGATIVE

## 2021-03-18 LAB — CBC
HCT: 35 % — ABNORMAL LOW (ref 39.0–52.0)
Hemoglobin: 11.1 g/dL — ABNORMAL LOW (ref 13.0–17.0)
MCH: 29.5 pg (ref 26.0–34.0)
MCHC: 31.7 g/dL (ref 30.0–36.0)
MCV: 93.1 fL (ref 80.0–100.0)
Platelets: 160 10*3/uL (ref 150–400)
RBC: 3.76 MIL/uL — ABNORMAL LOW (ref 4.22–5.81)
RDW: 14.5 % (ref 11.5–15.5)
WBC: 11.8 10*3/uL — ABNORMAL HIGH (ref 4.0–10.5)
nRBC: 0 % (ref 0.0–0.2)

## 2021-03-18 LAB — PROTIME-INR
INR: 1 (ref 0.8–1.2)
Prothrombin Time: 13.3 seconds (ref 11.4–15.2)

## 2021-03-18 LAB — GLUCOSE, CAPILLARY
Glucose-Capillary: 171 mg/dL — ABNORMAL HIGH (ref 70–99)
Glucose-Capillary: 191 mg/dL — ABNORMAL HIGH (ref 70–99)
Glucose-Capillary: 179 mg/dL — ABNORMAL HIGH (ref 70–99)
Glucose-Capillary: 212 mg/dL — ABNORMAL HIGH (ref 70–99)
Glucose-Capillary: 215 mg/dL — ABNORMAL HIGH (ref 70–99)

## 2021-03-18 LAB — RESP PANEL BY RT-PCR (FLU A&B, COVID) ARPGX2
Influenza A by PCR: NEGATIVE
Influenza B by PCR: NEGATIVE
SARS Coronavirus 2 by RT PCR: NEGATIVE

## 2021-03-18 LAB — HIV ANTIBODY (ROUTINE TESTING W REFLEX): HIV Screen 4th Generation wRfx: NONREACTIVE

## 2021-03-18 SURGERY — ARTHROPLASTY, HIP, TOTAL, ANTERIOR APPROACH
Anesthesia: General | Site: Hip | Laterality: Left

## 2021-03-18 MED ORDER — ACETAMINOPHEN 10 MG/ML IV SOLN
1000.0000 mg | Freq: Once | INTRAVENOUS | Status: AC
  Administered 2021-03-18: 1000 mg via INTRAVENOUS
  Filled 2021-03-18: qty 100

## 2021-03-18 MED ORDER — IRRISEPT - 450ML BOTTLE WITH 0.05% CHG IN STERILE WATER, USP 99.95% OPTIME
TOPICAL | Status: DC | PRN
  Administered 2021-03-18: 450 mL

## 2021-03-18 MED ORDER — KETOROLAC TROMETHAMINE 30 MG/ML IJ SOLN
INTRAMUSCULAR | Status: DC | PRN
  Administered 2021-03-18: 30 mg via INTRAVENOUS

## 2021-03-18 MED ORDER — DEXAMETHASONE SODIUM PHOSPHATE 10 MG/ML IJ SOLN
INTRAMUSCULAR | Status: DC | PRN
  Administered 2021-03-18: 8 mg via INTRAVENOUS

## 2021-03-18 MED ORDER — HYDROMORPHONE HCL 1 MG/ML IJ SOLN
INTRAMUSCULAR | Status: AC
  Filled 2021-03-18: qty 1

## 2021-03-18 MED ORDER — DOCUSATE SODIUM 100 MG PO CAPS
100.0000 mg | ORAL_CAPSULE | Freq: Two times a day (BID) | ORAL | Status: DC
  Administered 2021-03-18 – 2021-03-22 (×8): 100 mg via ORAL
  Filled 2021-03-18 (×8): qty 1

## 2021-03-18 MED ORDER — TRANEXAMIC ACID-NACL 1000-0.7 MG/100ML-% IV SOLN
1000.0000 mg | Freq: Once | INTRAVENOUS | Status: AC
  Administered 2021-03-18: 1000 mg via INTRAVENOUS
  Filled 2021-03-18: qty 100

## 2021-03-18 MED ORDER — KETOROLAC TROMETHAMINE 30 MG/ML IJ SOLN
INTRAMUSCULAR | Status: AC
  Filled 2021-03-18: qty 1

## 2021-03-18 MED ORDER — ROCURONIUM BROMIDE 10 MG/ML (PF) SYRINGE
PREFILLED_SYRINGE | INTRAVENOUS | Status: DC | PRN
  Administered 2021-03-18: 50 mg via INTRAVENOUS
  Administered 2021-03-18: 10 mg via INTRAVENOUS

## 2021-03-18 MED ORDER — PROPOFOL 10 MG/ML IV BOLUS
INTRAVENOUS | Status: DC | PRN
  Administered 2021-03-18: 100 mg via INTRAVENOUS

## 2021-03-18 MED ORDER — BUPIVACAINE-EPINEPHRINE 0.25% -1:200000 IJ SOLN
INTRAMUSCULAR | Status: DC | PRN
  Administered 2021-03-18: 30 mL

## 2021-03-18 MED ORDER — DEXAMETHASONE SODIUM PHOSPHATE 10 MG/ML IJ SOLN
INTRAMUSCULAR | Status: AC
  Filled 2021-03-18: qty 1

## 2021-03-18 MED ORDER — ONDANSETRON HCL 4 MG PO TABS: 4.0000 mg | ORAL_TABLET | Freq: Four times a day (QID) | ORAL | Status: DC | PRN

## 2021-03-18 MED ORDER — HYDROMORPHONE HCL 1 MG/ML IJ SOLN
0.5000 mg | INTRAMUSCULAR | Status: DC | PRN
  Administered 2021-03-18: 0.5 mg via INTRAVENOUS
  Filled 2021-03-18: qty 0.5

## 2021-03-18 MED ORDER — LIDOCAINE 2% (20 MG/ML) 5 ML SYRINGE
INTRAMUSCULAR | Status: DC | PRN
  Administered 2021-03-18: 100 mg via INTRAVENOUS

## 2021-03-18 MED ORDER — ROCURONIUM BROMIDE 10 MG/ML (PF) SYRINGE
PREFILLED_SYRINGE | INTRAVENOUS | Status: AC
  Filled 2021-03-18: qty 10

## 2021-03-18 MED ORDER — LACTATED RINGERS IV SOLN: INTRAVENOUS | Status: DC

## 2021-03-18 MED ORDER — CEFAZOLIN SODIUM-DEXTROSE 2-4 GM/100ML-% IV SOLN
2.0000 g | Freq: Four times a day (QID) | INTRAVENOUS | Status: AC
  Administered 2021-03-19 (×2): 2 g via INTRAVENOUS
  Filled 2021-03-18 (×2): qty 100

## 2021-03-18 MED ORDER — LIDOCAINE 2% (20 MG/ML) 5 ML SYRINGE
INTRAMUSCULAR | Status: AC
  Filled 2021-03-18: qty 5

## 2021-03-18 MED ORDER — SODIUM CHLORIDE (PF) 0.9 % IJ SOLN
INTRAMUSCULAR | Status: DC | PRN
  Administered 2021-03-18: 30 mL
  Administered 2021-03-18: 50 mL

## 2021-03-18 MED ORDER — PHENYLEPHRINE HCL-NACL 10-0.9 MG/250ML-% IV SOLN
INTRAVENOUS | Status: DC | PRN
  Administered 2021-03-18: 40 ug/min via INTRAVENOUS

## 2021-03-18 MED ORDER — CHLORHEXIDINE GLUCONATE 4 % EX LIQD: 60.0000 mL | Freq: Once | CUTANEOUS | Status: DC

## 2021-03-18 MED ORDER — FENTANYL CITRATE (PF) 250 MCG/5ML IJ SOLN
INTRAMUSCULAR | Status: DC | PRN
  Administered 2021-03-18 (×2): 50 ug via INTRAVENOUS
  Administered 2021-03-18: 100 ug via INTRAVENOUS

## 2021-03-18 MED ORDER — ROSUVASTATIN CALCIUM 10 MG PO TABS: 10.0000 mg | ORAL_TABLET | Freq: Every day | ORAL | Status: DC

## 2021-03-18 MED ORDER — MORPHINE SULFATE (PF) 4 MG/ML IV SOLN
4.0000 mg | Freq: Once | INTRAVENOUS | Status: AC
  Administered 2021-03-18: 4 mg via INTRAVENOUS
  Filled 2021-03-18: qty 1

## 2021-03-18 MED ORDER — EPHEDRINE SULFATE-NACL 50-0.9 MG/10ML-% IV SOSY
PREFILLED_SYRINGE | INTRAVENOUS | Status: DC | PRN
  Administered 2021-03-18: 10 mg via INTRAVENOUS

## 2021-03-18 MED ORDER — PHENYLEPHRINE 40 MCG/ML (10ML) SYRINGE FOR IV PUSH (FOR BLOOD PRESSURE SUPPORT)
PREFILLED_SYRINGE | INTRAVENOUS | Status: DC | PRN
  Administered 2021-03-18 (×2): 200 ug via INTRAVENOUS

## 2021-03-18 MED ORDER — AMLODIPINE BESYLATE 5 MG PO TABS
5.0000 mg | ORAL_TABLET | Freq: Every day | ORAL | Status: DC
  Administered 2021-03-18 – 2021-03-21 (×4): 5 mg via ORAL
  Filled 2021-03-18 (×5): qty 1

## 2021-03-18 MED ORDER — BUPIVACAINE-EPINEPHRINE (PF) 0.25% -1:200000 IJ SOLN
INTRAMUSCULAR | Status: AC
  Filled 2021-03-18: qty 30

## 2021-03-18 MED ORDER — MORPHINE SULFATE (PF) 2 MG/ML IV SOLN
1.0000 mg | INTRAVENOUS | Status: DC | PRN
  Administered 2021-03-18: 2 mg via INTRAVENOUS
  Filled 2021-03-18 (×2): qty 1

## 2021-03-18 MED ORDER — POVIDONE-IODINE 10 % EX SWAB: 2.0000 "application " | Freq: Once | CUTANEOUS | Status: DC

## 2021-03-18 MED ORDER — PROMETHAZINE HCL 25 MG/ML IJ SOLN: 6.2500 mg | INTRAMUSCULAR | Status: DC | PRN

## 2021-03-18 MED ORDER — ISOPROPYL ALCOHOL 70 % SOLN
Status: AC
  Filled 2021-03-18: qty 480

## 2021-03-18 MED ORDER — MORPHINE SULFATE (PF) 2 MG/ML IV SOLN: 0.5000 mg | INTRAVENOUS | Status: DC | PRN

## 2021-03-18 MED ORDER — FENTANYL CITRATE (PF) 100 MCG/2ML IJ SOLN
INTRAMUSCULAR | Status: AC
  Filled 2021-03-18: qty 2

## 2021-03-18 MED ORDER — POVIDONE-IODINE 10 % EX SWAB
2.0000 "application " | Freq: Once | CUTANEOUS | Status: AC
  Administered 2021-03-18: 2 via TOPICAL

## 2021-03-18 MED ORDER — HYDROMORPHONE HCL 1 MG/ML IJ SOLN
0.2500 mg | INTRAMUSCULAR | Status: DC | PRN
  Administered 2021-03-18 (×2): 0.5 mg via INTRAVENOUS

## 2021-03-18 MED ORDER — ONDANSETRON HCL 4 MG/2ML IJ SOLN: 4.0000 mg | Freq: Four times a day (QID) | INTRAMUSCULAR | Status: DC | PRN

## 2021-03-18 MED ORDER — PHENYLEPHRINE HCL (PRESSORS) 10 MG/ML IV SOLN
INTRAVENOUS | Status: AC
  Filled 2021-03-18: qty 1

## 2021-03-18 MED ORDER — SENNOSIDES-DOCUSATE SODIUM 8.6-50 MG PO TABS: 1.0000 | ORAL_TABLET | Freq: Every evening | ORAL | Status: DC | PRN

## 2021-03-18 MED ORDER — TRANEXAMIC ACID-NACL 1000-0.7 MG/100ML-% IV SOLN
1000.0000 mg | INTRAVENOUS | Status: AC
  Administered 2021-03-18: 1000 mg via INTRAVENOUS
  Filled 2021-03-18: qty 100

## 2021-03-18 MED ORDER — CEFAZOLIN SODIUM-DEXTROSE 2-4 GM/100ML-% IV SOLN
2.0000 g | INTRAVENOUS | Status: AC
  Administered 2021-03-18: 2 g via INTRAVENOUS
  Filled 2021-03-18: qty 100

## 2021-03-18 MED ORDER — HYDROMORPHONE HCL 1 MG/ML IJ SOLN
0.5000 mg | INTRAMUSCULAR | Status: DC | PRN
  Administered 2021-03-18 (×2): 0.5 mg via INTRAVENOUS
  Filled 2021-03-18 (×2): qty 0.5

## 2021-03-18 MED ORDER — PROPOFOL 10 MG/ML IV BOLUS
INTRAVENOUS | Status: AC
  Filled 2021-03-18: qty 20

## 2021-03-18 MED ORDER — PHENOL 1.4 % MT LIQD: 1.0000 | OROMUCOSAL | Status: DC | PRN

## 2021-03-18 MED ORDER — ASPIRIN EC 325 MG PO TBEC
325.0000 mg | DELAYED_RELEASE_TABLET | Freq: Every day | ORAL | Status: DC
  Administered 2021-03-19 – 2021-03-22 (×4): 325 mg via ORAL
  Filled 2021-03-18 (×4): qty 1

## 2021-03-18 MED ORDER — METOCLOPRAMIDE HCL 5 MG PO TABS: 5.0000 mg | ORAL_TABLET | Freq: Three times a day (TID) | ORAL | Status: DC | PRN

## 2021-03-18 MED ORDER — INSULIN ASPART 100 UNIT/ML IJ SOLN
0.0000 [IU] | INTRAMUSCULAR | Status: DC
  Administered 2021-03-18 (×2): 2 [IU] via SUBCUTANEOUS
  Administered 2021-03-19: 3 [IU] via SUBCUTANEOUS
  Administered 2021-03-19: 5 [IU] via SUBCUTANEOUS

## 2021-03-18 MED ORDER — SODIUM CHLORIDE (PF) 0.9 % IJ SOLN
INTRAMUSCULAR | Status: AC
  Filled 2021-03-18: qty 30

## 2021-03-18 MED ORDER — ACETAMINOPHEN 325 MG PO TABS
325.0000 mg | ORAL_TABLET | Freq: Four times a day (QID) | ORAL | Status: DC | PRN
  Administered 2021-03-19 – 2021-03-20 (×5): 650 mg via ORAL
  Administered 2021-03-21: 350 mg via ORAL
  Administered 2021-03-21 – 2021-03-22 (×3): 650 mg via ORAL
  Filled 2021-03-18 (×11): qty 2

## 2021-03-18 MED ORDER — ONDANSETRON HCL 4 MG/2ML IJ SOLN
INTRAMUSCULAR | Status: AC
  Filled 2021-03-18: qty 2

## 2021-03-18 MED ORDER — HYDROCODONE-ACETAMINOPHEN 5-325 MG PO TABS
1.0000 | ORAL_TABLET | ORAL | Status: DC | PRN
  Administered 2021-03-19 – 2021-03-22 (×2): 1 via ORAL
  Filled 2021-03-18 (×2): qty 1

## 2021-03-18 MED ORDER — SODIUM CHLORIDE 0.9 % IR SOLN
Status: DC | PRN
  Administered 2021-03-18: 1000 mL

## 2021-03-18 MED ORDER — MENTHOL 3 MG MT LOZG: 1.0000 | LOZENGE | OROMUCOSAL | Status: DC | PRN

## 2021-03-18 MED ORDER — METOCLOPRAMIDE HCL 5 MG/ML IJ SOLN: 5.0000 mg | Freq: Three times a day (TID) | INTRAMUSCULAR | Status: DC | PRN

## 2021-03-18 MED ORDER — HYDROCODONE-ACETAMINOPHEN 7.5-325 MG PO TABS
1.0000 | ORAL_TABLET | ORAL | Status: DC | PRN
  Administered 2021-03-20: 1 via ORAL
  Filled 2021-03-18: qty 1

## 2021-03-18 MED ORDER — SENNA 8.6 MG PO TABS
1.0000 | ORAL_TABLET | Freq: Every day | ORAL | Status: DC
  Administered 2021-03-18 – 2021-03-21 (×4): 8.6 mg via ORAL
  Filled 2021-03-18 (×4): qty 1

## 2021-03-18 SURGICAL SUPPLY — 49 items
ADH SKN CLS APL DERMABOND .7 (GAUZE/BANDAGES/DRESSINGS) ×2
APL PRP STRL LF DISP 70% ISPRP (MISCELLANEOUS) ×1
CHLORAPREP W/TINT 26 (MISCELLANEOUS) ×2 IMPLANT
COVER PERINEAL POST (MISCELLANEOUS) ×2 IMPLANT
COVER SURGICAL LIGHT HANDLE (MISCELLANEOUS) ×2 IMPLANT
CUP SECTOR GRIPTON 58MM (Orthopedic Implant) ×2 IMPLANT
DERMABOND ADVANCED (GAUZE/BANDAGES/DRESSINGS) ×2
DERMABOND ADVANCED .7 DNX12 (GAUZE/BANDAGES/DRESSINGS) ×2 IMPLANT
DRAPE IMP U-DRAPE 54X76 (DRAPES) ×2 IMPLANT
DRAPE SHEET LG 3/4 BI-LAMINATE (DRAPES) ×6 IMPLANT
DRAPE U-SHAPE 47X51 STRL (DRAPES) ×4 IMPLANT
DRSG AQUACEL AG ADV 3.5X10 (GAUZE/BANDAGES/DRESSINGS) ×2 IMPLANT
ELECT REM PT RETURN 15FT ADLT (MISCELLANEOUS) ×2 IMPLANT
GAUZE SPONGE 4X4 12PLY STRL (GAUZE/BANDAGES/DRESSINGS) ×2 IMPLANT
GLOVE SRG 8 PF TXTR STRL LF DI (GLOVE) ×1 IMPLANT
GLOVE SURG ENC MOIS LTX SZ8.5 (GLOVE) ×4 IMPLANT
GLOVE SURG ENC TEXT LTX SZ7.5 (GLOVE) ×4 IMPLANT
GLOVE SURG UNDER POLY LF SZ8 (GLOVE) ×2
GLOVE SURG UNDER POLY LF SZ8.5 (GLOVE) ×2 IMPLANT
GOWN SPEC L3 XXLG W/TWL (GOWN DISPOSABLE) ×2 IMPLANT
GOWN STRL REUS W/TWL XL LVL3 (GOWN DISPOSABLE) ×2 IMPLANT
HANDPIECE INTERPULSE COAX TIP (DISPOSABLE) ×2
HEAD CERAMIC DELTA 36 PLUS 1.5 (Hips) ×2 IMPLANT
HOLDER FOLEY CATH W/STRAP (MISCELLANEOUS) ×2 IMPLANT
HOOD PEEL AWAY FLYTE STAYCOOL (MISCELLANEOUS) ×10 IMPLANT
JET LAVAGE IRRISEPT WOUND (IRRIGATION / IRRIGATOR) ×2
KIT TURNOVER KIT A (KITS) ×2 IMPLANT
LAVAGE JET IRRISEPT WOUND (IRRIGATION / IRRIGATOR) ×1 IMPLANT
LINER NEUTRAL 52X36X58N (Liner) ×2 IMPLANT
MANIFOLD NEPTUNE II (INSTRUMENTS) ×2 IMPLANT
PACK ANTERIOR HIP CUSTOM (KITS) ×2 IMPLANT
PENCIL SMOKE EVACUATOR (MISCELLANEOUS) ×2 IMPLANT
SAW OSC TIP CART 19.5X105X1.3 (SAW) ×2 IMPLANT
SEALER BIPOLAR AQUA 6.0 (INSTRUMENTS) ×2 IMPLANT
SET HNDPC FAN SPRY TIP SCT (DISPOSABLE) ×1 IMPLANT
STEM TRI LOC BPS SZ8 W GRIPTON ×1 IMPLANT
SUT ETHIBOND NAB CT1 #1 30IN (SUTURE) ×4 IMPLANT
SUT MNCRL AB 3-0 PS2 18 (SUTURE) ×2 IMPLANT
SUT MNCRL AB 4-0 PS2 18 (SUTURE) ×2 IMPLANT
SUT MON AB 2-0 CT1 36 (SUTURE) ×4 IMPLANT
SUT STRATAFIX PDO 1 14 VIOLET (SUTURE) ×2
SUT STRATFX PDO 1 14 VIOLET (SUTURE) ×1
SUT VIC AB 2-0 CT1 27 (SUTURE) ×2
SUT VIC AB 2-0 CT1 TAPERPNT 27 (SUTURE) ×1 IMPLANT
SUTURE STRATFX PDO 1 14 VIOLET (SUTURE) ×1 IMPLANT
SYR 3ML LL SCALE MARK (SYRINGE) ×2 IMPLANT
TRI LOC BPS SZ8 W GRIPTON ×2 IMPLANT
TUBE SUCTION HIGH CAP CLEAR NV (SUCTIONS) ×2 IMPLANT
WATER STERILE IRR 1000ML POUR (IV SOLUTION) ×4 IMPLANT

## 2021-03-18 NOTE — Consult Note (Addendum)
ORTHOPAEDIC CONSULTATION  REQUESTING PHYSICIAN: Kayleen Memos, DO  PCP:  [name], MD  Chief Complaint: Left hip pain  HPI: Dominic Mendoza is a 70 y.o. male who complains of left sided hip pain secondary to a fall. The patient has past medical history significant for coronary artery disease status post CABG and bioprosthetic aortic valve replacement in September 2021, PAD, history of CVA, chronic kidney disease stage II, and hypertension. The patient was loading a case of water into his truck, slipped, and fell on his left side. He immediately felt pain and went to the emergency department for further evaluation.  Imaging in the ED revealed a left hip fracture. Orthopedics was consulted for further evaluation and management. The hospitalist service admitted the patient to the orthopedic floor.   Past Medical History:  Diagnosis Date  . Arthritis   . Cancer [facility])    prostate  . Diabetes mellitus without complication ([place])   . GERD (gastroesophageal reflux disease)   . Hypertension   . Smokers' cough [facility])    Past Surgical History:  Procedure Laterality Date  . COLONOSCOPY    . LYMPHADENECTOMY Bilateral 02/25/2013   Procedure: LYMPHADENECTOMY;  Surgeon: Dutch Gray, MD;  Location: [facility];  Service: Urology;  Laterality: Bilateral;  Bilateral Pelvic   . ROBOT ASSISTED LAPAROSCOPIC RADICAL PROSTATECTOMY N/A 02/25/2013   Procedure: ROBOTIC ASSISTED LAPAROSCOPIC RADICAL PROSTATECTOMY LEVEL 2;  Surgeon: Dutch Gray, MD;  Location: [facility];  Service: Urology;  Laterality: N/A;   Social History   Socioeconomic History  . Marital status: Married    Spouse name: Not on file  . Number of children: Not on file  . Years of education: Not on file  . Highest education level: Not on file  Occupational History  . Not on file  Tobacco Use  . Smoking status: Heavy Tobacco Smoker    Packs/day: 2.00    Years: 31.00    Pack years: 62.00    Types: Cigarettes  . Smokeless tobacco: Never  Used  Substance and Sexual Activity  . Alcohol use: No  . Drug use: No  . Sexual activity: Not on file  Other Topics Concern  . Not on file  Social History Narrative  . Not on file   Social Determinants of Health   Financial Resource Strain: Not on file  Food Insecurity: Not on file  Transportation Needs: Not on file  Physical Activity: Not on file  Stress: Not on file  Social Connections: Not on file   History reviewed. No pertinent family history. Allergies  Allergen Reactions  . Shrimp [Shellfish Allergy] Rash   Prior to Admission medications   Medication Sig Start Date End Date Taking? Authorizing Provider  aspirin 81 MG chewable tablet Chew 81 mg by mouth daily.   Yes [provider]  benazepril (LOTENSIN) 10 MG tablet Take 10 mg by mouth daily. 03/13/21  Yes [provider]  clopidogrel (PLAVIX) 75 MG tablet Take 75 mg by mouth daily. 04/16/18  Yes [provider]  famotidine (PEPCID) 20 MG tablet Take 20 mg by mouth 2 (two) times daily. 01/06/21  Yes [provider]  glipiZIDE (GLUCOTROL) 5 MG tablet Take 5 mg by mouth 2 (two) times daily. 02/06/21  Yes [provider]  hydrochlorothiazide (HYDRODIURIL) 25 MG tablet Take 25 mg by mouth every morning. 02/16/21  Yes [provider]  metFORMIN (GLUCOPHAGE-XR) 500 MG 24 hr tablet Take 1,000 mg by mouth 2 (two) times daily. 12/22/20  Yes [provider]  metoprolol succinate (TOPROL-XL) 25 MG 24 hr tablet Take 25 mg by mouth daily. 12/24/20  Yes [provider]  pioglitazone (ACTOS) 15 MG tablet Take 15 mg by mouth daily. 02/16/21  Yes [provider]  rosuvastatin (CRESTOR) 10 MG tablet Take 10 mg by mouth at bedtime.   Yes [provider]   DG Ribs Unilateral W/Chest Left  Result Date: 03/17/2021 CLINICAL DATA:  Recent fall with left-sided chest pain, initial encounter EXAM: LEFT RIBS AND CHEST - 3+ VIEW COMPARISON:  12/16/2015 FINDINGS: Heart  is at the upper limits of normal in size. Postsurgical changes are seen. Aortic calcifications are noted. The lungs are well aerated bilaterally with mild left lung atelectatic changes. No definitive rib fractures are seen. No pneumothorax is noted. IMPRESSION: No acute rib fracture is noted.  Mild left lung atelectasis is seen. Electronically Signed   By: Inez Catalina M.D.   On: 03/17/2021 22:37   CT Hip Left Wo Contrast  Result Date: 03/18/2021 CLINICAL DATA:  Fracture, hip EXAM: CT OF THE LEFT HIP WITHOUT CONTRAST TECHNIQUE: Multidetector CT imaging of the left hip was performed according to the standard protocol. Multiplanar CT image reconstructions were also generated. COMPARISON:  Same day hip and femur radiographs. FINDINGS: Bones/Joint/Cartilage Nondisplaced subcapital fracture of the proximal femur. Femoral head is well seated within the acetabulum with relative preservation of the joint space. Ligaments Suboptimally assessed by CT. Muscles and Tendons Within normal limits. Soft tissues Small lipohemarthrosis. IMPRESSION: 1. Nondisplaced subcapital fracture of the proximal femur. 2. Small lipohemarthrosis. Electronically Signed   By: Dahlia Bailiff MD   On: 03/18/2021 00:37   DG Chest Port 1 View  Result Date: 03/18/2021 CLINICAL DATA:  Pre-op respiratory exam EXAM: PORTABLE CHEST 1 VIEW COMPARISON:  Mar 17, 2021. FINDINGS: Median sternotomy wires. The heart size and mediastinal contours are unchanged. Low lung volumes with bibasilar atelectasis. No visible pleural effusion or pneumothorax. The visualized skeletal structures are stable. IMPRESSION: Low lung volumes with bibasilar atelectasis. Electronically Signed   By: Dahlia Bailiff MD   On: 03/18/2021 01:09   DG Hip Unilat W or Wo Pelvis 2-3 Views Left  Result Date: 03/17/2021 CLINICAL DATA:  Recent fall with left hip pain, initial encounter EXAM: DG HIP (WITH OR WITHOUT PELVIS) 3V LEFT COMPARISON:  None. FINDINGS: Pelvic ring is intact. Mild  irregularity is noted at the junction of the femoral neck and femoral head which may be related to an undisplaced fracture. CT would be helpful for further evaluation. IMPRESSION: Findings suspicious for subcapital femoral neck fracture. CT would be helpful for further evaluation. Electronically Signed   By: Inez Catalina M.D.   On: 03/17/2021 22:36   DG Femur Min 2 Views Left  Result Date: 03/17/2021 CLINICAL DATA:  Recent fall with left leg pain, initial encounter EXAM: LEFT FEMUR 2 VIEWS COMPARISON:  None. FINDINGS: SFA stenting is seen. Irregularity in the subcapital femoral neck is again seen suspicious for fracture. More distal femur appears within normal limits. IMPRESSION: Irregularity in the subcapital femoral neck suspicious for undisplaced fracture. Electronically Signed   By: Inez Catalina M.D.   On: 03/17/2021 22:36    Positive ROS: All other systems have been reviewed and were otherwise negative with the exception of those mentioned in the HPI and as above.  Physical Exam: General: Alert, no acute distress Cardiovascular: No pedal edema Respiratory: No cyanosis, no use of accessory musculature GI: No organomegaly, abdomen is soft and non-tender  Skin: No lesions in the area of chief complaint.  Covered wound on left elbow Neurologic: Sensation intact distally Psychiatric: Patient is competent for consent with normal mood and affect Lymphatic: No axillary or cervical lymphadenopathy  MUSCULOSKELETAL: Tenderness to left hip. No shortening or rotation noted. Unable to perform a straight leg raise with the left leg. Plantar and dorsiflexion intact. He is neurovascularly intact distally.  Assessment: subcapital fracture of the left femur  Plan: Left hip fracture: Plan on surgery with Dr.Malaika Arnall. Discussed ORIF vs total hip arthroplasty with patient's spouse including risks and benefits.  Wife elects to proceed with THA. Patients wife consented for patient due to patient being sedated  with pain medication. Hold plavix until after surgery. Keep NPO until after surgery. Surgery planned for Thursday afternoon   The risks, benefits, and alternatives were discussed with the patient. There are risks associated with the surgery including, but not limited to, problems with anesthesia (death), infection, instability (giving out of the joint), dislocation, differences in leg length/angulation/rotation, fracture of bones, loosening or failure of implants, hematoma (blood accumulation) which may require surgical drainage, blood clots, pulmonary embolism, nerve injury (foot drop and lateral thigh numbness), and blood vessel injury. The patient understands these risks and elects to proceed.    Dorothyann Peng, PA [phone]    03/18/2021 3:20 PM

## 2021-03-18 NOTE — Discharge Instructions (Signed)
 Dr. Nekesha Font Joint Replacement Specialist [facility] [address] [place], [place] [address] [phone]   TOTAL HIP REPLACEMENT POSTOPERATIVE DIRECTIONS    Hip Rehabilitation, Guidelines Following Surgery   WEIGHT BEARING Weight bearing as tolerated with assist device (walker, cane, etc) as directed, use it as long as suggested by your surgeon or therapist, typically at least 4-6 weeks.  The results of a hip operation are greatly improved after range of motion and muscle strengthening exercises. Follow all safety measures which are given to protect your hip. If any of these exercises cause increased pain or swelling in your joint, decrease the amount until you are comfortable again. Then slowly increase the exercises. Call your caregiver if you have problems or questions.   HOME CARE INSTRUCTIONS  Most of the following instructions are designed to prevent the dislocation of your new hip.  . Remove items at home which could result in a fall. This includes throw rugs or furniture in walking pathways.  . Continue medications as instructed at time of discharge.  You may have some home medications which will be placed on hold until you complete the course of blood thinner medication.  You may start showering once you are discharged home. Do not remove your dressing. . Do not put on socks or shoes without following the instructions of your caregivers.   . Sit on chairs with arms. Use the chair arms to help push yourself up when arising.  . Arrange for the use of a toilet seat elevator so you are not sitting low.   Walk with walker as instructed.  . You may resume a sexual relationship in one month or when given the OK by your caregiver.  . Use walker as long as suggested by your caregivers.  . You may put full weight on your legs and walk as much as is comfortable. . Avoid periods of inactivity such as sitting longer than an hour when not asleep.  This helps prevent blood clots.  . You may return to work once you are cleared by your surgeon.  . Do not drive a car for 6 weeks or until released by your surgeon.  . Do not drive while taking narcotics.  . Wear elastic stockings for two weeks following surgery during the day but you may remove then at night.  . Make sure you keep all of your appointments after your operation with all of your doctors and caregivers. You should call the office at the above phone number and make an appointment for approximately two weeks after the date of your surgery. . Please pick up a stool softener and laxative for home use as long as you are requiring pain medications.  ICE to the affected hip every three hours for 30 minutes at a time and then as needed for pain and swelling. Continue to use ice on the hip for pain and swelling from surgery. You may notice swelling that will progress down to the foot and ankle.  This is normal after surgery.  Elevate the leg when you are not up walking on it.   . It is important for you to complete the blood thinner medication as prescribed by your doctor.  Continue to use the breathing machine which will help keep your temperature down.  It is common for your temperature to cycle up and down following surgery, especially at night when you are not up moving around and exerting yourself.  The breathing machine keeps your   lungs expanded and your temperature down.  RANGE OF MOTION AND STRENGTHENING EXERCISES  These exercises are designed to help you keep full movement of your hip joint. Follow your caregiver's or physical therapist's instructions. Perform all exercises about fifteen times, three times per day or as directed. Exercise both hips, even if you have had only one joint replacement. These exercises can be done on a training (exercise) mat, on the floor, on a table or on a bed. Use whatever works the best and is most comfortable for you. Use music or television while you are  exercising so that the exercises are a pleasant break in your day. This will make your life better with the exercises acting as a break in routine you can look forward to.  . Lying on your back, slowly slide your foot toward your buttocks, raising your knee up off the floor. Then slowly slide your foot back down until your leg is straight again.  . Lying on your back spread your legs as far apart as you can without causing discomfort.  . Lying on your side, raise your upper leg and foot straight up from the floor as far as is comfortable. Slowly lower the leg and repeat.  . Lying on your back, tighten up the muscle in the front of your thigh (quadriceps muscles). You can do this by keeping your leg straight and trying to raise your heel off the floor. This helps strengthen the largest muscle supporting your knee.  . Lying on your back, tighten up the muscles of your buttocks both with the legs straight and with the knee bent at a comfortable angle while keeping your heel on the floor.   SKILLED REHAB INSTRUCTIONS: If the patient is transferred to a skilled rehab facility following release from the hospital, a list of the current medications will be sent to the facility for the patient to continue.  When discharged from the skilled rehab facility, please have the facility set up the patient's Home Health Physical Therapy prior to being released. Also, the skilled facility will be responsible for providing the patient with their medications at time of release from the facility to include their pain medication and their blood thinner medication. If the patient is still at the rehab facility at time of the two week follow up appointment, the skilled rehab facility will also need to assist the patient in arranging follow up appointment in our office and any transportation needs.  POST-OPERATIVE OPIOID TAPER INSTRUCTIONS: . It is important to wean off of your opioid medication as soon as possible. If you do not  need pain medication after your surgery it is ok to stop day one. . Opioids include: o Codeine, Hydrocodone(Norco, Vicodin), Oxycodone(Percocet, oxycontin) and hydromorphone amongst others.  . Long term and even short term use of opiods can cause: o Increased pain response o Dependence o Constipation o Depression o Respiratory depression o And more.  . Withdrawal symptoms can include o Flu like symptoms o Nausea, vomiting o And more . Techniques to manage these symptoms o Hydrate well o Eat regular healthy meals o Stay active o Use relaxation techniques(deep breathing, meditating, yoga) . Do Not substitute Alcohol to help with tapering . If you have been on opioids for less than two weeks and do not have pain than it is ok to stop all together.  . Plan to wean off of opioids o This plan should start within one week post op of your joint replacement. o Maintain   the same interval or time between taking each dose and first decrease the dose.  o Cut the total daily intake of opioids by one tablet each day o Next start to increase the time between doses. o The last dose that should be eliminated is the evening dose.      MAKE SURE YOU:  . Understand these instructions.  . Will watch your condition.  . Will get help right away if you are not doing well or get worse.  Pick up stool softner and laxative for home use following surgery while on pain medications. Do not remove your dressing. The dressing is waterproof--it is OK to take showers. Continue to use ice for pain and swelling after surgery. Do not use any lotions or creams on the incision until instructed by your surgeon. Total Hip Protocol.   

## 2021-03-18 NOTE — Anesthesia Procedure Notes (Signed)
Procedure Name: Intubation Performed by: Rosaland Lao, CRNA Pre-anesthesia Checklist: Patient identified, Emergency Drugs available, Suction available and Patient being monitored Patient Re-evaluated:Patient Re-evaluated prior to induction Oxygen Delivery Method: Circle system utilized Preoxygenation: Pre-oxygenation with 100% oxygen Induction Type: IV induction Ventilation: Mask ventilation without difficulty and Oral airway inserted - appropriate to patient size Laryngoscope Size: Mac and 4 Grade View: Grade I Tube type: Oral Tube size: 7.5 mm Number of attempts: 1 Airway Equipment and Method: Stylet and Oral airway Placement Confirmation: ETT inserted through vocal cords under direct vision,  positive ETCO2 and breath sounds checked- equal and bilateral Secured at: 22 cm Tube secured with: Tape Dental Injury: Teeth and Oropharynx as per pre-operative assessment

## 2021-03-18 NOTE — H&P (Signed)
History and Physical    Dominic Mendoza [id number] DOB: 08-30-1951 DOA: 03/17/2021  PCP: [name], MD   Patient coming from: Home   Chief Complaint: Left leg pain   HPI: Dominic Mendoza is a 70 y.o. male with medical history significant for coronary artery disease status post CABG and bioprosthetic aortic valve replacement in September 2021, PAD, history of CVA, chronic kidney disease stage II, and hypertension, now presenting to the emergency department with proximal left leg pain after a fall.  The patient reports that he been in his usual state of health and was trying to load a case of water bottles into his truck when he stumbled and fell onto his left side.  He suffered some abrasions to his elbow and was experiencing severe pain at the proximal left lower extremity.  He denies hitting his head or losing consciousness.  Reports that he has not had any chest pain since his CABG last year and has been able to ascend a flight of stairs without any angina.  Denies any recent fevers, chills, cough, shortness of breath, or leg swelling.  ED Course: Upon arrival to the ED, patient is found to be afebrile, saturating well on room air, and with stable blood pressure.  EKG features sinus rhythm with chronic RBBB.  Chest x-ray notable for low lung volumes and bibasilar atelectasis.  Plain films of the left femur were inconclusive and CT demonstrates nondisplaced subcapital fracture of the left proximal femur.  Chemistry panel notable for glucose 183 and creatinine 1.26.  CBC with mild leukocytosis and normocytic anemia.  Orthopedic surgery was consulted by the ED physician, patient was treated with morphine, and hospitalists called to admit.  Review of Systems:  All other systems reviewed and apart from HPI, are negative.  Past Medical History:  Diagnosis Date  . Arthritis   . Cancer [facility])    prostate  . Diabetes mellitus without complication ([place])   . GERD (gastroesophageal reflux disease)    . Hypertension   . Smokers' cough [facility])     Past Surgical History:  Procedure Laterality Date  . COLONOSCOPY    . LYMPHADENECTOMY Bilateral 02/25/2013   Procedure: LYMPHADENECTOMY;  Surgeon: Dutch Gray, MD;  Location: [facility];  Service: Urology;  Laterality: Bilateral;  Bilateral Pelvic   . ROBOT ASSISTED LAPAROSCOPIC RADICAL PROSTATECTOMY N/A 02/25/2013   Procedure: ROBOTIC ASSISTED LAPAROSCOPIC RADICAL PROSTATECTOMY LEVEL 2;  Surgeon: Dutch Gray, MD;  Location: [facility];  Service: Urology;  Laterality: N/A;    Social History:   reports that he has been smoking cigarettes. He has a 62.00 pack-year smoking history. He has never used smokeless tobacco. He reports that he does not drink alcohol and does not use drugs.  Allergies  Allergen Reactions  . Shrimp [Shellfish Allergy] Rash    History reviewed. No pertinent family history.   Prior to Admission medications   Medication Sig Start Date End Date Taking? Authorizing Provider  amLODipine-benazepril (LOTREL) 5-10 MG per capsule Take 1 capsule by mouth daily before breakfast.    [provider]  aspirin 81 MG chewable tablet Chew 81 mg by mouth daily.    [provider]  ciprofloxacin (CIPRO) 500 MG tablet Take 1 tablet (500 mg total) by mouth 2 (two) times daily. Start day prior to office visit for foley removal Patient not taking: Reported on 09/23/2018 02/25/13   [name], PA-C  clopidogrel (PLAVIX) 75 MG tablet Take 75 mg by mouth daily. 04/16/18   [provider]  glipiZIDE (GLUCOTROL) 10 MG tablet Take 10 mg by mouth 2 (two) times daily before a meal.    [provider]  HYDROcodone-acetaminophen (NORCO) 5-325 MG per tablet Take 1-2 tablets by mouth every 6 (six) hours as needed for pain. Patient not taking: Reported on 09/23/2018 02/25/13   [name], PA-C  ibuprofen (ADVIL,MOTRIN) 200 MG tablet Take 400 mg by mouth every 6 (six) hours as needed for mild pain.    [provider]   metFORMIN (GLUCOPHAGE) 500 MG tablet Take 1,000 mg by mouth 2 (two) times daily with a meal.    [provider]  rosuvastatin (CRESTOR) 10 MG tablet Take 10 mg by mouth at bedtime.    [provider]    Physical Exam: Vitals:   03/18/21 0100 03/18/21 0125 03/18/21 0216 03/18/21 0222  BP: (!) 129/102 100/60  133/60  Pulse: 70 67  69  Resp: 15 15  16   Temp: 97.7 F (36.5 C) 97.7 F (36.5 C)  98.3 F (36.8 C)  TempSrc: Oral Oral  Oral  SpO2: 96% 98%  95%  Weight:   91.9 kg   Height:   5\' 9"  (1.753 m)     Constitutional: NAD, calm  Eyes: PERTLA, lids and conjunctivae normal ENMT: Mucous membranes are moist. Posterior pharynx clear of any exudate or lesions.   Neck: supple, no masses  Respiratory:  no wheezing, no crackles. No accessory muscle use.  Cardiovascular: S1 & S2 heard, regular rate and rhythm. No extremity edema.  Abdomen: No distension, no tenderness, soft. Bowel sounds active.  Musculoskeletal: no clubbing / cyanosis. Tender left hip, neurovascularly intact distally.   Skin: no significant rashes, lesions, ulcers. Warm, dry, well-perfused. Neurologic: CN 2-12 grossly intact. Sensation intact. Moving all extremities.  Psychiatric: Alert and oriented to person, place, and situation. Pleasant and cooperative.    Labs and Imaging on Admission: I have personally reviewed following labs and imaging studies  CBC: Recent Labs  Lab 03/17/21 2256 03/18/21 0311  WBC 11.1* 11.8*  NEUTROABS 8.5*  --   HGB 10.7* 11.1*  HCT 32.8* 35.0*  MCV 91.4 93.1  PLT 165 073   Basic Metabolic Panel: Recent Labs  Lab 03/17/21 2256 03/18/21 0311  NA 139 136  K 4.4 4.0  CL 103 105  CO2 28 23  GLUCOSE 183* 169*  BUN 24* 22  CREATININE 1.26* 1.13  CALCIUM 9.4 8.9   GFR: Estimated Creatinine Clearance: 70.1 mL/min (by C-G formula based on SCr of 1.13 mg/dL). Liver Function Tests: Recent Labs  Lab 03/17/21 2256  AST 13*  ALT 13  ALKPHOS 83  BILITOT 0.5   PROT 7.0  ALBUMIN 3.9   No results for input(s): LIPASE, AMYLASE in the last 168 hours. No results for input(s): AMMONIA in the last 168 hours. Coagulation Profile: Recent Labs  Lab 03/18/21 0100  INR 1.0   Cardiac Enzymes: No results for input(s): CKTOTAL, CKMB, CKMBINDEX, TROPONINI in the last 168 hours. BNP (last 3 results) No results for input(s): PROBNP in the last 8760 hours. HbA1C: No results for input(s): HGBA1C in the last 72 hours. CBG: Recent Labs  Lab 03/18/21 0316  GLUCAP 171*   Lipid Profile: No results for input(s): CHOL, HDL, LDLCALC, TRIG, CHOLHDL, LDLDIRECT in the last 72 hours. Thyroid Function Tests: No results for input(s): TSH, T4TOTAL, FREET4, T3FREE, THYROIDAB in the last 72 hours. Anemia Panel: No results for input(s): VITAMINB12, FOLATE, FERRITIN, TIBC, IRON, RETICCTPCT in the last 72 hours. Urine  analysis:    Component Value Date/Time   COLORURINE YELLOW 03/18/2021 0043   APPEARANCEUR CLEAR 03/18/2021 0043   LABSPEC 1.015 03/18/2021 0043   PHURINE 5.0 03/18/2021 0043   GLUCOSEU NEGATIVE 03/18/2021 0043   HGBUR SMALL (A) 03/18/2021 0043   BILIRUBINUR NEGATIVE 03/18/2021 0043   KETONESUR NEGATIVE 03/18/2021 0043   PROTEINUR NEGATIVE 03/18/2021 0043   NITRITE NEGATIVE 03/18/2021 0043   LEUKOCYTESUR NEGATIVE 03/18/2021 0043   Sepsis Labs: @LABRCNTIP (procalcitonin:4,lacticidven:4) ) Recent Results (from the past 240 hour(s))  Resp Panel by RT-PCR (Flu A&B, Covid) Nasopharyngeal Swab     Status: None   Collection Time: 03/17/21 11:11 PM   Specimen: Nasopharyngeal Swab; Nasopharyngeal(NP) swabs in vial transport medium  Result Value Ref Range Status   SARS Coronavirus 2 by RT PCR NEGATIVE NEGATIVE Final    Comment: (NOTE) SARS-CoV-2 target nucleic acids are NOT DETECTED.  The SARS-CoV-2 RNA is generally detectable in upper respiratory specimens during the acute phase of infection. The lowest concentration of SARS-CoV-2 viral copies this  assay can detect is 138 copies/mL. A negative result does not preclude SARS-Cov-2 infection and should not be used as the sole basis for treatment or other patient management decisions. A negative result may occur with  improper specimen collection/handling, submission of specimen other than nasopharyngeal swab, presence of viral mutation(s) within the areas targeted by this assay, and inadequate number of viral copies(<138 copies/mL). A negative result must be combined with clinical observations, patient history, and epidemiological information. The expected result is Negative.  Fact Sheet for Patients:  EntrepreneurPulse.com.au  Fact Sheet for Healthcare Providers:  IncredibleEmployment.be  This test is no t yet approved or cleared by the [place] FDA and  has been authorized for detection and/or diagnosis of SARS-CoV-2 by FDA under an Emergency Use Authorization (EUA). This EUA will remain  in effect (meaning this test can be used) for the duration of the COVID-19 declaration under Section 564(b)(1) of the Act, 21 U.S.C.section 360bbb-3(b)(1), unless the authorization is terminated  or revoked sooner.       Influenza A by PCR NEGATIVE NEGATIVE Final   Influenza B by PCR NEGATIVE NEGATIVE Final    Comment: (NOTE) The Xpert Xpress SARS-CoV-2/FLU/RSV plus assay is intended as an aid in the diagnosis of influenza from Nasopharyngeal swab specimens and should not be used as a sole basis for treatment. Nasal washings and aspirates are unacceptable for Xpert Xpress SARS-CoV-2/FLU/RSV testing.  Fact Sheet for Patients: EntrepreneurPulse.com.au  Fact Sheet for Healthcare Providers: IncredibleEmployment.be  This test is not yet approved or cleared by the [place] FDA and has been authorized for detection and/or diagnosis of SARS-CoV-2 by FDA under an Emergency Use Authorization (EUA). This EUA will  remain in effect (meaning this test can be used) for the duration of the COVID-19 declaration under Section 564(b)(1) of the Act, 21 U.S.C. section 360bbb-3(b)(1), unless the authorization is terminated or revoked.  Performed at [facility], [place] [address] [place], [place] [address]      Radiological Exams on Admission: DG Ribs Unilateral W/Chest Left  Result Date: 03/17/2021 CLINICAL DATA:  Recent fall with left-sided chest pain, initial encounter EXAM: LEFT RIBS AND CHEST - 3+ VIEW COMPARISON:  12/16/2015 FINDINGS: Heart is at the upper limits of normal in size. Postsurgical changes are seen. Aortic calcifications are noted. The lungs are well aerated bilaterally with mild left lung atelectatic changes. No definitive rib fractures are seen. No pneumothorax is noted. IMPRESSION: No acute rib fracture is noted.  Mild left lung atelectasis is seen. Electronically Signed   By: Inez Catalina M.D.   On: 03/17/2021 22:37   CT Hip Left Wo Contrast  Result Date: 03/18/2021 CLINICAL DATA:  Fracture, hip EXAM: CT OF THE LEFT HIP WITHOUT CONTRAST TECHNIQUE: Multidetector CT imaging of the left hip was performed according to the standard protocol. Multiplanar CT image reconstructions were also generated. COMPARISON:  Same day hip and femur radiographs. FINDINGS: Bones/Joint/Cartilage Nondisplaced subcapital fracture of the proximal femur. Femoral head is well seated within the acetabulum with relative preservation of the joint space. Ligaments Suboptimally assessed by CT. Muscles and Tendons Within normal limits. Soft tissues Small lipohemarthrosis. IMPRESSION: 1. Nondisplaced subcapital fracture of the proximal femur. 2. Small lipohemarthrosis. Electronically Signed   By: Dahlia Bailiff MD   On: 03/18/2021 00:37   DG Chest Port 1 View  Result Date: 03/18/2021 CLINICAL DATA:  Pre-op respiratory exam EXAM: PORTABLE CHEST 1 VIEW COMPARISON:  Mar 17, 2021. FINDINGS: Median sternotomy  wires. The heart size and mediastinal contours are unchanged. Low lung volumes with bibasilar atelectasis. No visible pleural effusion or pneumothorax. The visualized skeletal structures are stable. IMPRESSION: Low lung volumes with bibasilar atelectasis. Electronically Signed   By: Dahlia Bailiff MD   On: 03/18/2021 01:09   DG Hip Unilat W or Wo Pelvis 2-3 Views Left  Result Date: 03/17/2021 CLINICAL DATA:  Recent fall with left hip pain, initial encounter EXAM: DG HIP (WITH OR WITHOUT PELVIS) 3V LEFT COMPARISON:  None. FINDINGS: Pelvic ring is intact. Mild irregularity is noted at the junction of the femoral neck and femoral head which may be related to an undisplaced fracture. CT would be helpful for further evaluation. IMPRESSION: Findings suspicious for subcapital femoral neck fracture. CT would be helpful for further evaluation. Electronically Signed   By: Inez Catalina M.D.   On: 03/17/2021 22:36   DG Femur Min 2 Views Left  Result Date: 03/17/2021 CLINICAL DATA:  Recent fall with left leg pain, initial encounter EXAM: LEFT FEMUR 2 VIEWS COMPARISON:  None. FINDINGS: SFA stenting is seen. Irregularity in the subcapital femoral neck is again seen suspicious for fracture. More distal femur appears within normal limits. IMPRESSION: Irregularity in the subcapital femoral neck suspicious for undisplaced fracture. Electronically Signed   By: Inez Catalina M.D.   On: 03/17/2021 22:36    EKG: Independently reviewed. Sinus rhythm, chronic RBBB.   Assessment/Plan   1. Left hip fracture  - Presents with pain after a mechanical fall and is found to have subcapital fracture of left femur  - Orthopedic surgery consulting and much appreciated  - Based on the available data, Mr. Twenter presents an estimated 0.7% risk of perioperative MI or cardiac arrest  - Hold Plavix and benazepril, keep NPO, continue pain-control and supportive care    2. CAD  - Status-post CABG in September 2021  - No angina, hold  Plavix for likely surgery, continue statin   3. Type II DM  - A1c was 7.0% in January 2022  - Check CBGs and use low-intensity SSI     4. Hypertension  - BP at goal, hold ACE-I in preparation for likely orthopedic surgery, continue amlodipine    5. CKD II  - SCr is 1.26 on admission, was 1.12 in April 2022  - Hold ACE-i preoperatively, monitor    6. PAD - Status-post left SFA atherectomy and stent in 2016  - No acute ischemia, hold Plavix and continue statin   7. History of CVA  -  Continue statin, hold Plavix preoperatively    DVT prophylaxis: SCDs  Code Status: Full  Level of Care: Level of care: Med-Surg Family Communication: None present  Disposition Plan:  Patient is from: home  Anticipated d/c is to: TBD Anticipated d/c date is: 03/21/21 Patient currently: Pending ortho consultation and likely operative repair of femur  Consults called: Orthopedic surgery  Admission status: Inpatient     Vianne Bulls, MD [facility]  03/18/2021, 4:35 AM

## 2021-03-18 NOTE — Progress Notes (Signed)
HPI: Dominic Mendoza is a 70 y.o. male with medical history significant for coronary artery disease status post CABG and bioprosthetic aortic valve replacement in September 2021, PAD, history of CVA, chronic kidney disease stage II, and hypertension, now presenting to the emergency department with proximal left leg pain after a fall.  The patient reports that he been in his usual state of health and was trying to load a case of water bottles into his truck when he stumbled and fell onto his left side.  He suffered some abrasions to his elbow and was experiencing severe pain at the proximal left lower extremity.  He denies hitting his head or losing consciousness.  Reports that he has not had any chest pain since his CABG last year and has been able to ascend a flight of stairs without any angina.  Denies any recent fevers, chills, cough, shortness of breath, or leg swelling.  ED Course: Upon arrival to the ED, patient is found to be afebrile, saturating well on room air, and with stable blood pressure.  EKG features sinus rhythm with chronic RBBB.  Chest x-ray notable for low lung volumes and bibasilar atelectasis.  Plain films of the left femur were inconclusive and CT demonstrates nondisplaced subcapital fracture of the left proximal femur.  Chemistry panel notable for glucose 183 and creatinine 1.26.  CBC with mild leukocytosis and normocytic anemia.  Orthopedic surgery was consulted by the ED physician, patient was treated with morphine, and hospitalists called to admit.  03/18/21: Patient was seen and examined at bedside.  He was in a lot of pain, requiring IV pain medications.  Seen by orthopedic surgery with plan for surgical repair.  Please refer to H&P dictated by my partner Dr. Myna Hidalgo on 03/18/2021 for further details of the assessment and plan.

## 2021-03-18 NOTE — Anesthesia Preprocedure Evaluation (Addendum)
Anesthesia Evaluation  Patient identified by MRN, date of birth, ID band Patient awake    Reviewed: Allergy & Precautions, H&P , NPO status , Patient's Chart, lab work & pertinent test results, reviewed documented beta blocker date and time   Airway Mallampati: II  TM Distance: >3 FB Neck ROM: Full    Dental  (+) Dental Advisory Given   Pulmonary shortness of breath, COPD, Current Smoker, former smoker,    + rhonchi  + decreased breath sounds      Cardiovascular hypertension, Pt. on medications and Pt. on home beta blockers + CAD and + CABG  Normal cardiovascular exam+ Valvular Problems/Murmurs  Rhythm:Regular Rate:Normal  S/P CABG/AVR 07/30/2020  Echo 11/03/2020 (Care everywhere) LVSF ~53%. AV mean gradient 49mmHg   LeftVentricle: Moderately dilated left ventricular size. [name] LeftVentricle: There is mild concentric hypertrophy. . LeftVentricle: Systolic function is low normal. EF: 50-55%. . AorticValve: There is a bioprosthetic valve with a normal transvalvular gradient of 12 mmHg . MitralValve: There is mild regurgitation. . TricuspidValve: There is mild regurgitation. . TricuspidValve: The right ventricular systolic pressure is normal (<36 mmHg). . LeftVentricle: Inferobasal wall hypokinesis.     Left Ventricle Moderately dilated left ventricular size. There is mild concentric hypertrophy. Systolic function is low normal. EF: 50-55%. Inferobasal wall hypokinesis. Doppler parameters consistent with mild diastolic dysfunction and low to normal LA pressure.  Right Ventricle Right ventricle is normal. Systolic function is normal.  Left Atrium Left atrium is normal in size.  Right Atrium Normal sized right atrium.  IVC/SVC The inferior vena cava demonstrates a diameter of <=2.1 cm and collapses >50%; therefore, the right atrial pressure is estimated at 3 mmHg.  Mitral Valve There is mild annular  calcification. There is mild regurgitation.  Tricuspid Valve Tricuspid valve structure is normal. There is mild regurgitation. The right ventricular systolic pressure is normal (<36 mmHg).  Aortic Valve There is a bioprosthetic valve with a normal transvalvular gradient of 12 mmHg The prosthetic valve appears to be well-seated without rocking. There is no regurgitation or stenosis.  Pulmonic Valve The pulmonic valve was not well visualized. Trace regurgitation.  Ascending Aorta The aortic root is normal in size.  Pericardium There is no pericardial effusion.  Study Details A complete echo was performed using complete 2D, color flow Doppler and spectral Doppler. Overall the study quality was adequate.    EKG: NSR with Right BBB   Neuro/Psych negative neurological ROS  negative psych ROS   GI/Hepatic Neg liver ROS, GERD  ,  Endo/Other  negative endocrine ROSdiabetes, Poorly Controlled, Type 2, Oral Hypoglycemic Agents  Renal/GU Renal disease   Neoplasm prostate negative genitourinary   Musculoskeletal  (+) Arthritis ,   Abdominal   Peds  Hematology negative hematology ROS (+)   Anesthesia Other Findings   Reproductive/Obstetrics                          Anesthesia Physical  Anesthesia Plan  ASA: III  Anesthesia Plan: General   Post-op Pain Management:    Induction: Intravenous  PONV Risk Score and Plan: 3 and Ondansetron, Dexamethasone and Treatment may vary due to age or medical condition  Airway Management Planned: Oral ETT  Additional Equipment: None  Intra-op Plan:   Post-operative Plan: Extubation in OR  Informed Consent: I have reviewed the patients History and Physical, chart, labs and discussed the procedure including the risks, benefits and alternatives for the proposed anesthesia with the patient or authorized  representative who has indicated his/her understanding and acceptance.     Dental advisory given  Plan  Discussed with: CRNA  Anesthesia Plan Comments:       Anesthesia Quick Evaluation

## 2021-03-18 NOTE — TOC Initial Note (Signed)
Transition of Care [facility]) - Initial/Assessment Note    Patient Details  Name: Dominic Mendoza MRN: [id number] Date of Birth: 18-Apr-1951  Transition of Care [facility]) CM/SW Contact:    Lennart Pall, LCSW Phone Number: 03/18/2021, 12:51 PM  Clinical Narrative:                 Met with pt and wife this morning to introduce self/ TOC role.  Pt lying in bed, sedated but does offer brief responses.  Wife reports they are awaiting ortho MD to evaluate pt and determine plan of treatment.  She notes that pt was completely independent PTA. She had recently returned to her work after caring for patient following cardiac surgery he had last year.  She is somewhat concerned about his chronic health issues and if they might affect his recovery from any ortho surgery.  At this point, pt and wife understand TOC will monitor case until he has had surgery and follow up therapy evaluations.  Goal is a dc home if pt is safe with intermittent assistance.  Expected Discharge Plan: [place] Barriers to Discharge: Continued Medical Work up   Patient Goals and CMS Choice Patient states their goals for this hospitalization and ongoing recovery are:: to return home      Expected Discharge Plan and Services Expected Discharge Plan: [place] In-house Referral: Clinical Social Work     Living arrangements for the past 2 months: Single Family Home                                      Prior Living Arrangements/Services Living arrangements for the past 2 months: Single Family Home Lives with:: Spouse Patient language and need for interpreter reviewed:: Yes Do you feel safe going back to the place where you live?: Yes      Need for Family Participation in Patient Care: Yes (Comment) Care giver support system in place?: Yes (comment)   Criminal Activity/Legal Involvement Pertinent to Current Situation/Hospitalization: No - Comment as needed  Activities of Daily  Living Home Assistive Devices/Equipment: None ADL Screening (condition at time of admission) Patient's cognitive ability adequate to safely complete daily activities?: Yes Is the patient deaf or have difficulty hearing?: No Does the patient have difficulty seeing, even when wearing glasses/contacts?: No Does the patient have difficulty concentrating, remembering, or making decisions?: No Patient able to express need for assistance with ADLs?: Yes Does the patient have difficulty dressing or bathing?: Yes Independently performs ADLs?: Yes (appropriate for developmental age) Does the patient have difficulty walking or climbing stairs?: Yes Weakness of Legs: Left Weakness of Arms/Hands: None  Permission Sought/Granted Permission sought to share information with : Family Supports Permission granted to share information with : Yes, Verbal Permission Granted  Share Information with NAME: Leelynd Maldonado     Permission granted to share info w Relationship: spouse  Permission granted to share info w Contact Information: [phone]  Emotional Assessment Appearance:: Appears stated age Attitude/Demeanor/Rapport: Lethargic Affect (typically observed): Accepting Orientation: : Oriented to Self,Oriented to Place,Oriented to  Time,Oriented to Situation Alcohol / Substance Use: Not Applicable Psych Involvement: No (comment)  Admission diagnosis:  Renal insufficiency [N28.9] Normochromic normocytic anemia [D64.9] Closed left hip fracture (HCC) [S72.002A] Closed fracture of neck of left femur, initial encounter ([place]) [S72.002A] Patient Active Problem List   Diagnosis Date Noted  . Closed left hip fracture ([place])  03/18/2021  . Hypertension   . Diabetes mellitus without complication ([place])   . CKD (chronic kidney disease), stage II   . CAD (coronary artery disease)   . History of CVA (cerebrovascular accident)    PCP:  [name], MD Pharmacy:   [facility], [place] [address] Phone: [phone] Fax: [phone]     Social Determinants of Health (SDOH) Interventions    Readmission Risk Interventions No flowsheet data found.

## 2021-03-18 NOTE — Transfer of Care (Signed)
Immediate Anesthesia Transfer of Care Note  Patient: Dominic Mendoza  Procedure(s) Performed: TOTAL HIP ARTHROPLASTY ANTERIOR APPROACH (Left Hip)  Patient Location: PACU  Anesthesia Type:General  Level of Consciousness: awake, alert  and oriented  Airway & Oxygen Therapy: Patient Spontanous Breathing and Patient connected to face mask  Post-op Assessment: Report given to RN and Post -op Vital signs reviewed and stable  Post vital signs: Reviewed and stable  Last Vitals:  Vitals Value Taken Time  BP 131/61 03/18/21 2030  Temp 36.5 C 03/18/21 2030  Pulse 80 03/18/21 2032  Resp 15 03/18/21 2032  SpO2 91 % 03/18/21 2032  Vitals shown include unvalidated device data.  Last Pain:  Vitals:   03/18/21 0941  TempSrc: Oral  PainSc:       Patients Stated Pain Goal: 2 (11/01/70 5366)  Complications: No complications documented.

## 2021-03-18 NOTE — ED Notes (Signed)
[phone], Dominic Mendoza, patients wife cell phone wants to be called before the surgery.

## 2021-03-18 NOTE — Op Note (Signed)
OPERATIVE REPORT  SURGEON: Rod Can, MD   ASSISTANT: Staff.  PREOPERATIVE DIAGNOSIS: Displaced Left femoral neck fracture.   POSTOPERATIVE DIAGNOSIS: Displaced Left femoral neck fracture.   PROCEDURE: Left total hip arthroplasty, anterior approach.   IMPLANTS: DePuy Tri Lock stem, size 8, hi offset. DePuy Pinnacle Cup, size 58 mm. DePuy Altrx liner, size 36 by 58 mm, neutral. DePuy Biolox ceramic head ball, size 36 + 1.5 mm.  ANESTHESIA:  General  ANTIBIOTICS: 2g ancef.  ESTIMATED BLOOD LOSS:-150 mL    DRAINS: None.  COMPLICATIONS: None   CONDITION: PACU - hemodynamically stable.   BRIEF CLINICAL NOTE: Dominic Mendoza is a 70 y.o. male with a displaced Left femoral neck fracture. The patient was admitted to the hospitalist service and underwent perioperative risk stratification and medical optimization. The risks, benefits, and alternatives to total hip arthroplasty were explained, and the patient elected to proceed.  PROCEDURE IN DETAIL: The patient was taken to the operating room and general anesthesia was induced on the hospital bed.  The patient was then positioned on the [place] table.  All bony prominences were well padded.  The hip was prepped and draped in the normal sterile surgical fashion.  A time-out was called verifying side and site of surgery. Antibiotics were given within 60 minutes of beginning the procedure.  Bikini incision was made, and the direct anterior approach to the hip was performed through the Hueter interval.  Lateral femoral circumflex vessels were treated with the Auqumantys. The anterior capsule was exposed and an inverted T capsulotomy was made.  Fracture hematoma was encountered and evacuated. The patient was found to have a vertical, comminuted Left subcapital femoral neck fracture.  I freshened the femoral neck cut with a saw.  I removed the femoral neck fragment.  A corkscrew was placed into the head and the head was removed.  This was  passed to the back table and was measured.   Acetabular exposure was achieved, and the pulvinar and labrum were excised. Sequential reaming of the acetabulum was then performed up to a size 57 mm reamer. A 58 mm cup was then opened and impacted into place at approximately 40 degrees of abduction and 20 degrees of anteversion. The final polyethylene liner was impacted into place.    I then gained femoral exposure taking care to protect the abductors and greater trochanter.  This was performed using standard external rotation, extension, and adduction.  The capsule was peeled off the inner aspect of the greater trochanter, taking care to preserve the short external rotators. A cookie cutter was used to enter the femoral canal, and then the femoral canal finder was used to confirm location.  I then sequentially broached up to a size 8.  Calcar planer was used on the femoral neck remnant.  I paced a hi neck and a trial head ball. The hip was reduced.  Leg lengths were checked fluoroscopically.  The hip was dislocated and trial components were removed.  I placed the real stem followed by the real spacer and head ball.  The hip was reduced.  Fluoroscopy was used to confirm component position and leg lengths.  At 90 degrees of external rotation and extension, the hip was stable to an anterior directed force.   The wound was copiously irrigated with Irrisept solution and normal saline using pule lavage.  Marcaine solution was injected into the periarticular soft tissue.  The wound was closed in layers using #1 Vicryl and V-Loc for the fascia, 2-0 Vicryl  for the subcutaneous fat, 2-0 Monocryl for the deep dermal layer, and staples + glue for the skin.  Once the glue was fully dried, an Aquacell Ag dressing was applied.  The patient was then awakened from anesthesia and transported to the recovery room in stable condition.  Sponge, needle, and instrument counts were correct at the end of the case x2.  The patient  tolerated the procedure well and there were no known complications.  Please note that a surgical assistant was a medical necessity for this procedure to perform it in a safe and expeditious manner. Assistant was necessary to provide appropriate retraction of vital neurovascular structures, to prevent femoral fracture, and to allow for anatomic placement of the prosthesis.

## 2021-03-19 ENCOUNTER — Encounter (HOSPITAL_COMMUNITY): Payer: Self-pay | Admitting: Orthopedic Surgery

## 2021-03-19 DIAGNOSIS — S72002A Fracture of unspecified part of neck of left femur, initial encounter for closed fracture: Secondary | ICD-10-CM | POA: Diagnosis not present

## 2021-03-19 LAB — GLUCOSE, CAPILLARY
Glucose-Capillary: 177 mg/dL — ABNORMAL HIGH (ref 70–99)
Glucose-Capillary: 200 mg/dL — ABNORMAL HIGH (ref 70–99)
Glucose-Capillary: 219 mg/dL — ABNORMAL HIGH (ref 70–99)
Glucose-Capillary: 225 mg/dL — ABNORMAL HIGH (ref 70–99)
Glucose-Capillary: 246 mg/dL — ABNORMAL HIGH (ref 70–99)
Glucose-Capillary: 293 mg/dL — ABNORMAL HIGH (ref 70–99)

## 2021-03-19 LAB — HEMOGLOBIN A1C
Hgb A1c MFr Bld: 8.1 % — ABNORMAL HIGH (ref 4.8–5.6)
Mean Plasma Glucose: 185.77 mg/dL

## 2021-03-19 LAB — BASIC METABOLIC PANEL
Anion gap: 5 (ref 5–15)
BUN: 29 mg/dL — ABNORMAL HIGH (ref 8–23)
CO2: 26 mmol/L (ref 22–32)
Calcium: 8.3 mg/dL — ABNORMAL LOW (ref 8.9–10.3)
Chloride: 104 mmol/L (ref 98–111)
Creatinine, Ser: 1.34 mg/dL — ABNORMAL HIGH (ref 0.61–1.24)
GFR, Estimated: 58 mL/min — ABNORMAL LOW (ref >60)
Glucose, Bld: 275 mg/dL — ABNORMAL HIGH (ref 70–99)
Potassium: 4.4 mmol/L (ref 3.5–5.1)
Sodium: 135 mmol/L (ref 135–145)

## 2021-03-19 LAB — CBC
HCT: 29.2 % — ABNORMAL LOW (ref 39.0–52.0)
Hemoglobin: 9.6 g/dL — ABNORMAL LOW (ref 13.0–17.0)
MCH: 30.3 pg (ref 26.0–34.0)
MCHC: 32.9 g/dL (ref 30.0–36.0)
MCV: 92.1 fL (ref 80.0–100.0)
Platelets: 138 10*3/uL — ABNORMAL LOW (ref 150–400)
RBC: 3.17 MIL/uL — ABNORMAL LOW (ref 4.22–5.81)
RDW: 14.4 % (ref 11.5–15.5)
WBC: 16.4 10*3/uL — ABNORMAL HIGH (ref 4.0–10.5)
nRBC: 0 % (ref 0.0–0.2)

## 2021-03-19 LAB — PHOSPHORUS: Phosphorus: 4.1 mg/dL (ref 2.5–4.6)

## 2021-03-19 LAB — MAGNESIUM: Magnesium: 1.5 mg/dL — ABNORMAL LOW (ref 1.7–2.4)

## 2021-03-19 MED ORDER — METOPROLOL SUCCINATE ER 25 MG PO TB24
25.0000 mg | ORAL_TABLET | Freq: Every day | ORAL | Status: DC
  Administered 2021-03-19 – 2021-03-21 (×3): 25 mg via ORAL
  Filled 2021-03-19 (×4): qty 1

## 2021-03-19 MED ORDER — PHENAZOPYRIDINE HCL 100 MG PO TABS
100.0000 mg | ORAL_TABLET | Freq: Three times a day (TID) | ORAL | Status: AC
  Administered 2021-03-19 – 2021-03-20 (×3): 100 mg via ORAL
  Filled 2021-03-19 (×5): qty 1

## 2021-03-19 MED ORDER — INSULIN ASPART 100 UNIT/ML IJ SOLN
0.0000 [IU] | Freq: Every day | INTRAMUSCULAR | Status: DC
  Administered 2021-03-19 – 2021-03-20 (×2): 2 [IU] via SUBCUTANEOUS
  Administered 2021-03-21: 4 [IU] via SUBCUTANEOUS

## 2021-03-19 MED ORDER — ROSUVASTATIN CALCIUM 10 MG PO TABS
10.0000 mg | ORAL_TABLET | Freq: Every day | ORAL | Status: DC
  Administered 2021-03-19 – 2021-03-21 (×3): 10 mg via ORAL
  Filled 2021-03-19 (×3): qty 1

## 2021-03-19 MED ORDER — MAGNESIUM OXIDE -MG SUPPLEMENT 400 (240 MG) MG PO TABS
800.0000 mg | ORAL_TABLET | Freq: Two times a day (BID) | ORAL | Status: AC
  Administered 2021-03-19 (×2): 800 mg via ORAL
  Filled 2021-03-19 (×2): qty 2

## 2021-03-19 MED ORDER — INSULIN ASPART 100 UNIT/ML IJ SOLN
0.0000 [IU] | Freq: Three times a day (TID) | INTRAMUSCULAR | Status: DC
  Administered 2021-03-19: 3 [IU] via SUBCUTANEOUS
  Administered 2021-03-19: 5 [IU] via SUBCUTANEOUS
  Administered 2021-03-19: 3 [IU] via SUBCUTANEOUS
  Administered 2021-03-20: 8 [IU] via SUBCUTANEOUS
  Administered 2021-03-20: 2 [IU] via SUBCUTANEOUS
  Administered 2021-03-20 – 2021-03-21 (×3): 5 [IU] via SUBCUTANEOUS
  Administered 2021-03-21: 3 [IU] via SUBCUTANEOUS
  Administered 2021-03-22: 5 [IU] via SUBCUTANEOUS
  Administered 2021-03-22: 11 [IU] via SUBCUTANEOUS

## 2021-03-19 MED ORDER — FAMOTIDINE 20 MG PO TABS
20.0000 mg | ORAL_TABLET | Freq: Two times a day (BID) | ORAL | Status: DC
  Administered 2021-03-19 – 2021-03-22 (×7): 20 mg via ORAL
  Filled 2021-03-19 (×7): qty 1

## 2021-03-19 NOTE — Progress Notes (Signed)
Physical Therapy Treatment Patient Details Name: Dominic Mendoza MRN: [id number] DOB: 1951-04-25 Today's Date: 03/19/2021    History of Present Illness Pt s/p fall with L hip fx and now s/p L THR by anterior direct approach.  Pt with hx of DM, CAD, recent CABG, aoric valve replacement, PAD and CKD stage II.    PT Comments    Pt very cooperative and progressing with mobility but continues to require increased time and step-by- step cues for completion of all tasks.  No c/o dizziness this session.   Follow Up Recommendations  CIR     Equipment Recommendations  None recommended by PT    Recommendations for Other Services       Precautions / Restrictions Precautions Precautions: Fall Restrictions Weight Bearing Restrictions: No Other Position/Activity Restrictions: WBAT    Mobility  Bed Mobility Overal bed mobility: Needs Assistance Bed Mobility: Sit to Supine     Supine to sit: Min assist;Mod assist;+2 for safety/equipment;+2 for physical assistance     General bed mobility comments: Increased time with use of bed rail and cues for sequence and use of R LE to self assist.  Physical assist to manage L LE and to manage trunk.    Transfers Overall transfer level: Needs assistance Equipment used: Rolling walker (2 wheeled) Transfers: Sit to/from Stand Sit to Stand: Min assist;Mod assist;+2 physical assistance;+2 safety/equipment;From elevated surface         General transfer comment: cues for LE management and use of UEs to self assist.  Physical assist to bring wt up and fwd and to balance in standing  Ambulation/Gait Ambulation/Gait assistance: Min assist;Mod assist;+2 safety/equipment Gait Distance (Feet): 8 Feet Assistive device: Rolling walker (2 wheeled) Gait Pattern/deviations: Step-to pattern;Decreased step length - right;Decreased step length - left;Shuffle;Trunk flexed Gait velocity: decr   General Gait Details: cues for sequence, posture and position from  RW.  Physical assist for balance, support, and to advance L LE initially   Stairs             Wheelchair Mobility    Modified Rankin (Stroke Patients Only)       Balance Overall balance assessment: Needs assistance Sitting-balance support: No upper extremity supported;Feet supported Sitting balance-Leahy Scale: Fair     Standing balance support: Bilateral upper extremity supported Standing balance-Leahy Scale: Poor                              Cognition Arousal/Alertness: Awake/alert Behavior During Therapy: WFL for tasks assessed/performed Overall Cognitive Status: Within Functional Limits for tasks assessed                                        Exercises Total Joint Exercises Ankle Circles/Pumps: AROM;Both;15 reps;Supine Quad Sets: AROM;Both;10 reps;Supine Heel Slides: AAROM;Left;20 reps;Supine Hip ABduction/ADduction: AAROM;Left;15 reps;Supine    General Comments        Pertinent Vitals/Pain Pain Assessment: 0-10 Pain Score: 5  Pain Location: L hip/groin Pain Descriptors / Indicators: Aching;Sore;Burning Pain Intervention(s): Limited activity within patient's tolerance;Monitored during session;Ice applied;Patient requesting pain meds-RN notified    Home Living Family/patient expects to be discharged to:: Unsure Living Arrangements: Spouse/significant other Available Help at Discharge: Family Type of Home: House Home Access: Stairs to enter Entrance Stairs-Rails: None Home Layout: One level Home Equipment: Environmental consultant - 2 wheels      Prior Function Level  of Independence: Independent      Comments: Did not use assistive device but spouse states has been unsteady   PT Goals (current goals can now be found in the care plan section) Acute Rehab PT Goals Patient Stated Goal: Regain IND and walk safely again PT Goal Formulation: With patient Time For Goal Achievement: 03/26/21 Potential to Achieve Goals: Good Progress  towards PT goals: Progressing toward goals    Frequency    Min 5X/week      PT Plan Current plan remains appropriate    Co-evaluation              AM-PAC PT "6 Clicks" Mobility   Outcome Measure  Help needed turning from your back to your side while in a flat bed without using bedrails?: A Lot Help needed moving from lying on your back to sitting on the side of a flat bed without using bedrails?: A Lot Help needed moving to and from a bed to a chair (including a wheelchair)?: A Lot Help needed standing up from a chair using your arms (e.g., wheelchair or bedside chair)?: A Lot Help needed to walk in hospital room?: A Lot Help needed climbing 3-5 steps with a railing? : Total 6 Click Score: 11    End of Session Equipment Utilized During Treatment: Gait belt Activity Tolerance: Patient limited by fatigue Patient left: with call bell/phone within reach;with family/visitor present;in bed;with bed alarm set;with nursing/sitter in room Nurse Communication: Mobility status PT Visit Diagnosis: Unsteadiness on feet (R26.81);Muscle weakness (generalized) (M62.81);Difficulty in walking, not elsewhere classified (R26.2);Pain Pain - Right/Left: Left Pain - part of body: Hip     Time: 1345-1411 PT Time Calculation (min) (ACUTE ONLY): 26 min  Charges:  $Gait Training: 23-37 mins $Therapeutic Exercise: 8-22 mins                     Debe Coder PT Acute Rehabilitation Services Pager [phone] Office [phone]    Laiya Wisby 03/19/2021, 2:14 PM

## 2021-03-19 NOTE — Evaluation (Signed)
Physical Therapy Evaluation Patient Details Name: Dominic Mendoza MRN: [id number] DOB: [birth year] Today's Date: 03/19/2021   History of Present Illness  Pt s/p fall with L hip fx and now s/p L THR by anterior direct approach.  Pt with hx of DM, CAD, recent CABG, aoric valve replacement, PAD and CKD stage II.  Clinical Impression  Pt admitted as above and presenting with functional mobility limitations 2* generalized weakness, decreased L LE strength/ROM, post op pain, pre-existing balance deficits and c/o dizziness with mobility (BP 126/60).  Pt would benefit from follow up CIR level rehab to maximize IND and safety prior to dc home with assist of spouse.    Follow Up Recommendations CIR (family request)    Equipment Recommendations  None recommended by PT    Recommendations for Other Services       Precautions / Restrictions Precautions Precautions: Fall Restrictions Weight Bearing Restrictions: No      Mobility  Bed Mobility Overal bed mobility: Needs Assistance Bed Mobility: Supine to Sit     Supine to sit: Mod assist;+2 for physical assistance;+2 for safety/equipment     General bed mobility comments: Increased time with use of bed rail and cues for sequence and use of R LE to self assist.  Physical assist to manage L LE and to manage trunk.    Transfers Overall transfer level: Needs assistance Equipment used: Rolling walker (2 wheeled) Transfers: Sit to/from Stand Sit to Stand: Min assist;Mod assist;+2 physical assistance;+2 safety/equipment;From elevated surface         General transfer comment: cues for LE management and use of UEs to self assist.  Physical assist to bring wt up and fwd and to balance in standing  Ambulation/Gait Ambulation/Gait assistance: Min assist;Mod assist;+2 safety/equipment Gait Distance (Feet): 8 Feet Assistive device: Rolling walker (2 wheeled) Gait Pattern/deviations: Step-to pattern;Decreased step length - right;Decreased step  length - left;Shuffle;Trunk flexed Gait velocity: decr   General Gait Details: cues for sequence, posture and position from RW.  Physical assist for balance, support, and to advance L LE initially  Stairs            Wheelchair Mobility    Modified Rankin (Stroke Patients Only)       Balance Overall balance assessment: Needs assistance Sitting-balance support: No upper extremity supported;Feet supported Sitting balance-Leahy Scale: Fair     Standing balance support: Bilateral upper extremity supported Standing balance-Leahy Scale: Poor                               Pertinent Vitals/Pain Pain Assessment: 0-10 Pain Score: 4  Pain Location: L hip/groin Pain Descriptors / Indicators: Aching;Sore Pain Intervention(s): Limited activity within patient's tolerance;Monitored during session;Premedicated before session    [place] expects to be discharged to:: Unsure Living Arrangements: Spouse/significant other Available Help at Discharge: Family Type of Home: House Home Access: Stairs to enter Entrance Stairs-Rails: None Technical brewer of Steps: 3 Home Layout: One level Home Equipment: Environmental consultant - 2 wheels      Prior Function Level of Independence: Independent         Comments: Did not use assistive device but spouse states has been unsteady     Hand Dominance   Dominant Hand: Left    Extremity/Trunk Assessment   Upper Extremity Assessment Upper Extremity Assessment: Generalized weakness    Lower Extremity Assessment Lower Extremity Assessment: Generalized weakness;LLE deficits/detail LLE Deficits / Details: Strength at hip 2/5 with  AAROM at hip to 75 flex and 15 abd    Cervical / Trunk Assessment Cervical / Trunk Assessment: Normal  Communication   Communication: No difficulties  Cognition Arousal/Alertness: Awake/alert Behavior During Therapy: WFL for tasks assessed/performed Overall Cognitive Status: Within  Functional Limits for tasks assessed                                        General Comments      Exercises Total Joint Exercises Ankle Circles/Pumps: AROM;Both;15 reps;Supine Quad Sets: AROM;Both;10 reps;Supine Heel Slides: AAROM;Left;20 reps;Supine Hip ABduction/ADduction: AAROM;Left;15 reps;Supine   Assessment/Plan    PT Assessment Patient needs continued PT services  PT Problem List Decreased strength;Decreased range of motion;Decreased activity tolerance;Decreased balance;Decreased mobility;Decreased knowledge of use of DME;Pain       PT Treatment Interventions DME instruction;Gait training;Stair training;Functional mobility training;Therapeutic activities;Therapeutic exercise;Patient/family education    PT Goals (Current goals can be found in the Care Plan section)  Acute Rehab PT Goals Patient Stated Goal: Regain IND and walk safely again PT Goal Formulation: With patient Time For Goal Achievement: 03/26/21 Potential to Achieve Goals: Good    Frequency Min 5X/week   Barriers to discharge        Co-evaluation               AM-PAC PT "6 Clicks" Mobility  Outcome Measure Help needed turning from your back to your side while in a flat bed without using bedrails?: A Lot Help needed moving from lying on your back to sitting on the side of a flat bed without using bedrails?: A Lot Help needed moving to and from a bed to a chair (including a wheelchair)?: A Lot Help needed standing up from a chair using your arms (e.g., wheelchair or bedside chair)?: A Lot Help needed to walk in hospital room?: A Lot Help needed climbing 3-5 steps with a railing? : Total 6 Click Score: 11    End of Session Equipment Utilized During Treatment: Gait belt Activity Tolerance: Patient limited by fatigue Patient left: in chair;with call bell/phone within reach;with chair alarm set;with family/visitor present Nurse Communication: Mobility status PT Visit Diagnosis:  Unsteadiness on feet (R26.81);Muscle weakness (generalized) (M62.81);Difficulty in walking, not elsewhere classified (R26.2);Pain Pain - Right/Left: Left Pain - part of body: Hip    Time: 2876-8115 PT Time Calculation (min) (ACUTE ONLY): 41 min   Charges:   PT Evaluation $PT Eval Low Complexity: 1 Low PT Treatments $Gait Training: 8-22 mins $Therapeutic Exercise: 8-22 mins        Debe Coder PT Acute Rehabilitation Services Pager [phone] Office [phone]   Latrell Potempa 03/19/2021, 12:25 PM

## 2021-03-19 NOTE — Progress Notes (Signed)
    Subjective:  Patient reports pain as mild to moderate.  Denies N/V/CP/SOB. No c/o  Objective:   VITALS:   Vitals:   03/18/21 2130 03/18/21 2148 03/18/21 2354 03/19/21 0359  BP:  (!) 154/97 126/78 (!) 162/82  Pulse:  99 94 81  Resp:  16 18 17   Temp: 97.7 F (36.5 C) 97.9 F (36.6 C) 98 F (36.7 C) 97.9 F (36.6 C)  TempSrc:  Oral Oral Oral  SpO2:  97% 90% 93%  Weight:      Height:        NAD ABD soft Sensation intact distally Intact pulses distally Dorsiflexion/Plantar flexion intact Incision: dressing C/D/I Compartment soft   Lab Results  Component Value Date   WBC 16.4 (H) 03/19/2021   HGB 9.6 (L) 03/19/2021   HCT 29.2 (L) 03/19/2021   MCV 92.1 03/19/2021   PLT 138 (L) 03/19/2021   BMET    Component Value Date/Time   NA 135 03/19/2021 0309   K 4.4 03/19/2021 0309   CL 104 03/19/2021 0309   CO2 26 03/19/2021 0309   GLUCOSE 275 (H) 03/19/2021 0309   BUN 29 (H) 03/19/2021 0309   CREATININE 1.34 (H) 03/19/2021 0309   CALCIUM 8.3 (L) 03/19/2021 0309   GFRNONAA 58 (L) 03/19/2021 0309   GFRAA >90 02/18/2013 0930     Assessment/Plan: 1 Day Post-Op   Principal Problem:   Closed left hip fracture (HCC) Active Problems:   Hypertension   Diabetes mellitus without complication (HCC)   CKD (chronic kidney disease), stage II   CAD (coronary artery disease)   History of CVA (cerebrovascular accident)   WBAT with walker DVT ppx: ASA + Plavix, SCDs, TEDS PO pain control PT/OT Dispo: D/C planning   Dominic Mendoza 03/19/2021, 7:46 AM   Rod Can, MD [phone] [facility] is now [facility]  [place] [address], [place], [place] [address] Phone: [phone] [email or web address] Facebook  [employer]

## 2021-03-19 NOTE — Progress Notes (Addendum)
Rehab Admissions Coordinator Note:  Patient was screened by Cleatrice Burke for appropriateness for an Inpatient Acute Rehab Consult per therapy recommendation. I had Dr. Naaman Plummer reviewed case and patient is felt to have Medical neccesity for an inpt rehab admit. I will place order per protocol and an AC, Lauren , will follow up with patient and family this weekend. At this time, we are recommending Inpatient Rehab consult.   Cleatrice Burke RN MSN 03/19/2021, 1:27 PM  I can be reached at [phone].

## 2021-03-19 NOTE — Progress Notes (Signed)
PROGRESS NOTE  JOURNEY RATTERMAN ZOX:[id number] DOB: 01-Feb-1951 DOA: 03/17/2021 PCP: [name], MD  HPI/Recap of past 24 hours: WJX:BJYNW [name] a 70 y.o.malewith medical history significant forcoronary artery disease status post CABG and bioprosthetic aortic valve replacement in September 2021, PAD, history of CVA, chronic kidney disease stage II, and hypertension, now presenting to the emergency department with proximal left leg pain after a fall. The patient reports that he been in his usual state of health and was trying to load a case of water bottles into his truck when he stumbled and fell onto his left side. He suffered some abrasions to his elbow and was experiencing severe pain at the proximal left lower extremity. He denies hitting his head or losing consciousness. Reports that he has not had any chest pain since his CABG last year and has been able to ascend a flight of stairs without any angina. Denies any recent fevers, chills, cough, shortness of breath, or leg swelling.  ED Course:Upon arrival to the ED, patient is found to be afebrile, saturating well on room air, and with stable blood pressure. EKG features sinus rhythm with chronic RBBB. Chest x-ray notable for low lung volumes and bibasilar atelectasis. Plain films of the left femur were inconclusive and CT demonstrates nondisplaced subcapital fracture of the left proximal femur. Chemistry panel notable for glucose 183 and creatinine 1.26. CBC with mild leukocytosis and normocytic anemia. Orthopedic surgery was consulted by the ED physician, patient was treated with morphine, and hospitalists called to admit.  03/19/21:  Some confusions overnight.  Alert and O x3 this morning.  Pain is well controlled on current management.  Assessment/Plan: Principal Problem:   Closed left hip fracture (HCC) Active Problems:   Hypertension   Diabetes mellitus without complication (HCC)   CKD (chronic kidney disease), stage  II   CAD (coronary artery disease)   History of CVA (cerebrovascular accident)   Left hip fracture  post repair on 03/18/2021 by Dr. [name] Management per orthopedic surgery.  Acute blood loss anemia post surgery Drop in hemoglobin from 11-9.6 Continue to monitor H&H Transfuse as indicated  Leukocytosis likely reactive perioperatively Monitor Repeat CBC in the morning  CAD  status post CABG in September 2021. Status-post CABG in September 2021  Resume antiplatelets when okay with surgery. Denies any anginal symptoms.  Type II DM  with hyperglycemia - A1c was 7.0% in January 2022  Insulin sliding scale  Hypomagnesemia Magnesium 1.5 Repleted  Recheck  Hypertension  BP is not at goal Continue home oral antihypertensives, hold ACE inhibitor due to uptrending creatinine. Continue to monitor vital signs  Ureteral spasm Pyridium 100 mg 3 times daily  CKD II  - SCr is 1.26 on admission, was 1.12 in April 2022  - Hold ACE-i preoperatively, monitor    PAD - Status-post left SFA atherectomy and stent in 2016  - No acute ischemia, hold Plavix and continue statin   History of CVA  - Continue statin, hold Plavix preoperatively    DVT prophylaxis: SCDs  Code Status: Full  Level of Care: Level of care: Med-Surg Family Communication: None present  Disposition Plan:  Patient is from: home  Anticipated d/c is to: TBD Anticipated d/c date is: 03/21/21 Patient currently: Pending ortho consultation and likely operative repair of femur  Consults called: Orthopedic surgery  Admission status: Inpatient             Objective: Vitals:   03/19/21 0359 03/19/21 0825 03/19/21 1336 03/19/21 1740  BP: (!) 162/82 (!) 152/65 120/64 (!) 154/61  Pulse: 81 72 67 70  Resp: 17 14 16 19   Temp: 97.9 F (36.6 C) 97.6 F (36.4 C) 97.6 F (36.4 C) 98.4 F (36.9 C)  TempSrc: Oral Oral Oral Oral  SpO2: 93% 95% 96% 97%  Weight:      Height:        Intake/Output  Summary (Last 24 hours) at 03/19/2021 1811 Last data filed at 03/19/2021 1716 Gross per 24 hour  Intake 2238.75 ml  Output 900 ml  Net 1338.75 ml   Filed Weights   03/17/21 2104 03/18/21 0216  Weight: 93.8 kg 91.9 kg    Exam:  . General: 70 y.o. year-old male well developed well nourished in no acute distress.  Alert and oriented x3. . Cardiovascular: Regular rate and rhythm with no rubs or gallops.  No thyromegaly or JVD noted.   [name] Respiratory: Clear to auscultation with no wheezes or rales. Good inspiratory effort. . Abdomen: Soft nontender nondistended with normal bowel sounds x4 quadrants. . Musculoskeletal: No lower extremity edema. 2/4 pulses in all 4 extremities. . Skin: No ulcerative lesions noted or rashes, . Psychiatry: Mood is appropriate for condition and setting   Data Reviewed: CBC: Recent Labs  Lab 03/17/21 2256 03/18/21 0311 03/19/21 0309  WBC 11.1* 11.8* 16.4*  NEUTROABS 8.5*  --   --   HGB 10.7* 11.1* 9.6*  HCT 32.8* 35.0* 29.2*  MCV 91.4 93.1 92.1  PLT 165 160 295*   Basic Metabolic Panel: Recent Labs  Lab 03/17/21 2256 03/18/21 0311 03/19/21 0309  NA 139 136 135  K 4.4 4.0 4.4  CL 103 105 104  CO2 28 23 26   GLUCOSE 183* 169* 275*  BUN 24* 22 29*  CREATININE 1.26* 1.13 1.34*  CALCIUM 9.4 8.9 8.3*  MG  --   --  1.5*  PHOS  --   --  4.1   GFR: Estimated Creatinine Clearance: 59.1 mL/min (A) (by C-G formula based on SCr of 1.34 mg/dL (H)). Liver Function Tests: Recent Labs  Lab 03/17/21 2256  AST 13*  ALT 13  ALKPHOS 83  BILITOT 0.5  PROT 7.0  ALBUMIN 3.9   No results for input(s): LIPASE, AMYLASE in the last 168 hours. No results for input(s): AMMONIA in the last 168 hours. Coagulation Profile: Recent Labs  Lab 03/18/21 0100  INR 1.0   Cardiac Enzymes: No results for input(s): CKTOTAL, CKMB, CKMBINDEX, TROPONINI in the last 168 hours. BNP (last 3 results) No results for input(s): PROBNP in the last 8760  hours. HbA1C: Recent Labs    03/19/21 0309  HGBA1C 8.1*   CBG: Recent Labs  Lab 03/19/21 0016 03/19/21 0353 03/19/21 0738 03/19/21 1146 03/19/21 1653  GLUCAP 293* 246* 225* 200* 177*   Lipid Profile: No results for input(s): CHOL, HDL, LDLCALC, TRIG, CHOLHDL, LDLDIRECT in the last 72 hours. Thyroid Function Tests: No results for input(s): TSH, T4TOTAL, FREET4, T3FREE, THYROIDAB in the last 72 hours. Anemia Panel: No results for input(s): VITAMINB12, FOLATE, FERRITIN, TIBC, IRON, RETICCTPCT in the last 72 hours. Urine analysis:    Component Value Date/Time   COLORURINE YELLOW 03/18/2021 [place] 03/18/2021 0043   LABSPEC 1.015 03/18/2021 0043   PHURINE 5.0 03/18/2021 0043   GLUCOSEU NEGATIVE 03/18/2021 0043   HGBUR SMALL (A) 03/18/2021 [place] 03/18/2021 [place] 03/18/2021 [place] 03/18/2021 0043   NITRITE NEGATIVE 03/18/2021 0043  LEUKOCYTESUR NEGATIVE 03/18/2021 0043   Sepsis Labs: @LABRCNTIP (procalcitonin:4,lacticidven:4)  ) Recent Results (from the past 240 hour(s))  Resp Panel by RT-PCR (Flu A&B, Covid) Nasopharyngeal Swab     Status: None   Collection Time: 03/17/21 11:11 PM   Specimen: Nasopharyngeal Swab; Nasopharyngeal(NP) swabs in vial transport medium  Result Value Ref Range Status   SARS Coronavirus 2 by RT PCR NEGATIVE NEGATIVE Final    Comment: (NOTE) SARS-CoV-2 target nucleic acids are NOT DETECTED.  The SARS-CoV-2 RNA is generally detectable in upper respiratory specimens during the acute phase of infection. The lowest concentration of SARS-CoV-2 viral copies this assay can detect is 138 copies/mL. A negative result does not preclude SARS-Cov-2 infection and should not be used as the sole basis for treatment or other patient management decisions. A negative result may occur with  improper specimen collection/handling, submission of specimen other than nasopharyngeal swab,  presence of viral mutation(s) within the areas targeted by this assay, and inadequate number of viral copies(<138 copies/mL). A negative result must be combined with clinical observations, patient history, and epidemiological information. The expected result is Negative.  Fact Sheet for Patients:  EntrepreneurPulse.com.au  Fact Sheet for Healthcare Providers:  IncredibleEmployment.be  This test is no t yet approved or cleared by the [place] FDA and  has been authorized for detection and/or diagnosis of SARS-CoV-2 by FDA under an Emergency Use Authorization (EUA). This EUA will remain  in effect (meaning this test can be used) for the duration of the COVID-19 declaration under Section 564(b)(1) of the Act, 21 U.S.C.section 360bbb-3(b)(1), unless the authorization is terminated  or revoked sooner.       Influenza A by PCR NEGATIVE NEGATIVE Final   Influenza B by PCR NEGATIVE NEGATIVE Final    Comment: (NOTE) The Xpert Xpress SARS-CoV-2/FLU/RSV plus assay is intended as an aid in the diagnosis of influenza from Nasopharyngeal swab specimens and should not be used as a sole basis for treatment. Nasal washings and aspirates are unacceptable for Xpert Xpress SARS-CoV-2/FLU/RSV testing.  Fact Sheet for Patients: EntrepreneurPulse.com.au  Fact Sheet for Healthcare Providers: IncredibleEmployment.be  This test is not yet approved or cleared by the [place] FDA and has been authorized for detection and/or diagnosis of SARS-CoV-2 by FDA under an Emergency Use Authorization (EUA). This EUA will remain in effect (meaning this test can be used) for the duration of the COVID-19 declaration under Section 564(b)(1) of the Act, 21 U.S.C. section 360bbb-3(b)(1), unless the authorization is terminated or revoked.  Performed at [facility], [place] [address] [place], [place] [address]        Studies: Pelvis Portable  Result Date: 03/18/2021 CLINICAL DATA:  Status post left hip replacement. EXAM: PORTABLE PELVIS 1-2 VIEWS COMPARISON:  Fluoroscopic images of same day. FINDINGS: The left acetabular and femoral components are well situated. Expected postoperative changes are seen in the surrounding soft tissues. IMPRESSION: Status post left total hip arthroplasty. Electronically Signed   By: Marijo Conception M.D.   On: 03/18/2021 21:21   DG C-Arm 1-60 Min-No Report  Result Date: 03/18/2021 Fluoroscopy was utilized by the requesting physician.  No radiographic interpretation.   DG HIP OPERATIVE UNILAT W OR W/O PELVIS LEFT  Result Date: 03/18/2021 CLINICAL DATA:  Left hip replacement EXAM: OPERATIVE LEFT HIP WITH PELVIS COMPARISON:  CT from earlier in the same day. FLUOROSCOPY TIME:  Radiation Exposure Index (as provided by the fluoroscopic device): 1.13 mGy If the device does not provide the exposure index: Fluoroscopy  Time:  6 seconds Number of Acquired Images:  2 FINDINGS: Left hip prosthesis is noted in satisfactory position. No acute bony or soft tissue abnormality is noted. IMPRESSION: Status post left hip replacement. Electronically Signed   By: Inez Catalina M.D.   On: 03/18/2021 20:26    Scheduled Meds: . amLODipine  5 mg Oral Daily  . aspirin EC  325 mg Oral Q breakfast  . docusate sodium  100 mg Oral BID  . famotidine  20 mg Oral BID  . insulin aspart  0-15 Units Subcutaneous TID WC  . insulin aspart  0-5 Units Subcutaneous QHS  . magnesium oxide  800 mg Oral BID  . metoprolol succinate  25 mg Oral Daily  . phenazopyridine  100 mg Oral TID WC  . rosuvastatin  10 mg Oral QHS  . senna  1 tablet Oral QHS    Continuous Infusions:   LOS: 1 day     Kayleen Memos, MD [facility] Pager [phone]  If 7PM-7AM, please contact night-coverage [email or web address] Password [facility] 03/19/2021, 6:11 PM

## 2021-03-19 NOTE — Progress Notes (Signed)
Pt disoriented currently and most of the shift and pulled out IV. Was pulling at catheter and taking his clothes off.

## 2021-03-20 DIAGNOSIS — S72002A Fracture of unspecified part of neck of left femur, initial encounter for closed fracture: Secondary | ICD-10-CM | POA: Diagnosis not present

## 2021-03-20 LAB — CBC
HCT: 28.4 % — ABNORMAL LOW (ref 39.0–52.0)
Hemoglobin: 9.2 g/dL — ABNORMAL LOW (ref 13.0–17.0)
MCH: 29.8 pg (ref 26.0–34.0)
MCHC: 32.4 g/dL (ref 30.0–36.0)
MCV: 91.9 fL (ref 80.0–100.0)
Platelets: 137 10*3/uL — ABNORMAL LOW (ref 150–400)
RBC: 3.09 MIL/uL — ABNORMAL LOW (ref 4.22–5.81)
RDW: 14.6 % (ref 11.5–15.5)
WBC: 11.9 10*3/uL — ABNORMAL HIGH (ref 4.0–10.5)
nRBC: 0 % (ref 0.0–0.2)

## 2021-03-20 LAB — BASIC METABOLIC PANEL
Anion gap: 9 (ref 5–15)
BUN: 31 mg/dL — ABNORMAL HIGH (ref 8–23)
CO2: 27 mmol/L (ref 22–32)
Calcium: 9 mg/dL (ref 8.9–10.3)
Chloride: 101 mmol/L (ref 98–111)
Creatinine, Ser: 1.19 mg/dL (ref 0.61–1.24)
GFR, Estimated: >60 mL/min (ref >60)
Glucose, Bld: 171 mg/dL — ABNORMAL HIGH (ref 70–99)
Potassium: 4.2 mmol/L (ref 3.5–5.1)
Sodium: 137 mmol/L (ref 135–145)

## 2021-03-20 LAB — GLUCOSE, CAPILLARY
Glucose-Capillary: 179 mg/dL — ABNORMAL HIGH (ref 70–99)
Glucose-Capillary: 270 mg/dL — ABNORMAL HIGH (ref 70–99)
Glucose-Capillary: 212 mg/dL — ABNORMAL HIGH (ref 70–99)
Glucose-Capillary: 241 mg/dL — ABNORMAL HIGH (ref 70–99)

## 2021-03-20 MED ORDER — METHOCARBAMOL 500 MG PO TABS
500.0000 mg | ORAL_TABLET | Freq: Four times a day (QID) | ORAL | Status: DC | PRN
  Administered 2021-03-20 – 2021-03-22 (×6): 500 mg via ORAL
  Filled 2021-03-20 (×6): qty 1

## 2021-03-20 MED ORDER — METHOCARBAMOL 1000 MG/10ML IJ SOLN
500.0000 mg | Freq: Four times a day (QID) | INTRAVENOUS | Status: DC | PRN
  Filled 2021-03-20: qty 5

## 2021-03-20 NOTE — PMR Pre-admission (Signed)
PMR Admission Coordinator Pre-Admission Assessment  Patient: Dominic Mendoza is an 70 y.o., male MRN: [id number] DOB: 19-Jul-1951 Height: 5\' 9"  (175.3 cm) Weight: 91.9 kg  Insurance Information HMO:     PPO:      PCP:      IPA:      80/20: yes     OTHER:  PRIMARY: Medicare A & B      Policy#: [id number]      Subscriber: patient CM Name:       Phone#:      Fax#:  Pre-Cert#:       Employer:  Benefits:  Phone #: verified eligibility via [place] on 03/20/21     Name:  Eff. Date: Part A  B effective 04/30/17     Deduct: $1,556      Out of Pocket Max: NA      Life Max: NA CIR: 100% with Medicare approval      SNF: 100% days 1-20, 80% days 21-100 Outpatient: 80%     Co-Pay: 20% Home Health: 100%      Co-Pay:  DME: 80%     Co-Pay: 20% Providers: pt's choice SECONDARY: [facility] [employer]      Policy#: [id number]     Phone#: [phone]  Financial Counselor:       Phone#:   The "Data Collection Information Summary" for patients in Inpatient Rehabilitation Facilities with attached "Privacy Act [place] Records" was provided and verbally reviewed with: Patient and Family  Emergency Contact Information Contact Information    Name Relation Home Work Mobile   Pasquariello,Tracey W Spouse [phone]       Current Medical History  Patient Admitting Diagnosis: closed left hip fx, s/p left THA History of Present Illness: Pt is a 70 year old male with medical hx significant for: PAD, h/o CVA, HTN, CKD stage II, CAD s/p CABG and bioprosthetic aortic valve replacement (September 2021), and DM. Pt presented to ED on 03/17/21 after a fall resulting in abrasions to left knee and left elbow as well as pain in left thigh. CT scan revealed displaced left femoral neck fx. Pt is s/p left THA by anterior direct approach on 03/18/21. Therapy evaluated pt and recommended CIR d/t pt's deficits in mobility and balance, decreased strength, and  weakness.     Patient's medical record from [facility] has been reviewed by the rehabilitation admission coordinator and physician.  Past Medical History  Past Medical History:  Diagnosis Date  . Arthritis   . Cancer [facility])    prostate  . Diabetes mellitus without complication ([place])   . Dyspnea   . GERD (gastroesophageal reflux disease)   . Hypertension   . Smokers' cough ([place])     Family History   family history is not on file.  Prior Rehab/Hospitalizations Has the patient had prior rehab or hospitalizations prior to admission? Yes  Has the patient had major surgery during 100 days prior to admission? Yes   Current Medications  Current Facility-Administered Medications:  .  acetaminophen (TYLENOL) tablet 325-650 mg, 325-650 mg, Oral, Q6H PRN, Rod Can, MD, 650 mg at 03/22/21 0540 .  amLODipine (NORVASC) tablet 5 mg, 5 mg, Oral, Daily, Swinteck, Aaron Edelman, MD, 5 mg at 03/21/21 0818 .  aspirin EC tablet 325 mg, 325 mg, Oral, Q breakfast, Swinteck, Aaron Edelman, MD, 325 mg at 03/22/21 0743 .  docusate sodium (COLACE) capsule 100 mg, 100 mg, Oral, BID, Swinteck, Aaron Edelman, MD, 100 mg at 03/22/21 0837 .  famotidine (  PEPCID) tablet 20 mg, 20 mg, Oral, BID, Irene Pap N, DO, 20 mg at 03/22/21 6301 .  HYDROcodone-acetaminophen (NORCO) 7.5-325 MG per tablet 1-2 tablet, 1-2 tablet, Oral, Q4H PRN, Rod Can, MD, 1 tablet at 03/20/21 1830 .  HYDROcodone-acetaminophen (NORCO/VICODIN) 5-325 MG per tablet 1-2 tablet, 1-2 tablet, Oral, Q4H PRN, Rod Can, MD, 1 tablet at 03/22/21 0035 .  HYDROmorphone (DILAUDID) injection 0.5 mg, 0.5 mg, Intravenous, Q2H PRN, Rod Can, MD, 0.5 mg at 03/18/21 1456 .  insulin aspart (novoLOG) injection 0-15 Units, 0-15 Units, Subcutaneous, TID WC, Kayleen Memos, DO, 5 Units at 03/22/21 [phone] .  insulin aspart (novoLOG) injection 0-5 Units, 0-5 Units, Subcutaneous, QHS, Kayleen Memos, DO, 4 Units at 03/21/21 2150 .  insulin glargine (LANTUS) injection 8 Units, 8 Units, Subcutaneous, Daily, Kayleen Memos, DO, 8 Units at 03/22/21 0754 .  menthol-cetylpyridinium (CEPACOL) lozenge 3 mg, 1 lozenge, Oral, PRN **OR** phenol (CHLORASEPTIC) mouth spray 1 spray, 1 spray, Mouth/Throat, PRN, Swinteck, Aaron Edelman, MD .  methocarbamol (ROBAXIN) 500 mg in dextrose 5 % 50 mL IVPB, 500 mg, Intravenous, Q6H PRN, Hall, Carole N, DO .  methocarbamol (ROBAXIN) tablet 500 mg, 500 mg, Oral, Q6H PRN, Hall, Carole N, DO, 500 mg at 03/22/21 0540 .  metoCLOPramide (REGLAN) tablet 5-10 mg, 5-10 mg, Oral, Q8H PRN **OR** metoCLOPramide (REGLAN) injection 5-10 mg, 5-10 mg, Intravenous, Q8H PRN, Swinteck, Aaron Edelman, MD .  metoprolol succinate (TOPROL-XL) 24 hr tablet 25 mg, 25 mg, Oral, Daily, Hall, Carole N, DO, 25 mg at 03/21/21 0817 .  morphine 2 MG/ML injection 0.5-1 mg, 0.5-1 mg, Intravenous, Q2H PRN, Swinteck, Aaron Edelman, MD .  ondansetron (ZOFRAN) injection 4 mg, 4 mg, Intravenous, Q6H PRN, Swinteck, Aaron Edelman, MD .  ondansetron (ZOFRAN) tablet 4 mg, 4 mg, Oral, Q6H PRN **OR** ondansetron (ZOFRAN) injection 4 mg, 4 mg, Intravenous, Q6H PRN, Swinteck, Aaron Edelman, MD .  rosuvastatin (CRESTOR) tablet 10 mg, 10 mg, Oral, QHS, Hall, Carole N, DO, 10 mg at 03/21/21 2150 .  senna (SENOKOT) tablet 8.6 mg, 1 tablet, Oral, QHS, Swinteck, Aaron Edelman, MD, 8.6 mg at 03/21/21 2150  Patients Current Diet:  Diet Order            Diet heart healthy/carb modified Room service appropriate? Yes; Fluid consistency: Thin  Diet effective now                 Precautions / Restrictions Precautions Precautions: Fall Restrictions Weight Bearing Restrictions: No Other Position/Activity Restrictions: WBAT   Has the patient had 2 or more falls or a fall with injury in the past year? Yes  Prior Activity Level Community (5-7x/wk): drives, works  Prior Functional Level Self Care: Did the patient need help bathing, dressing, using the toilet or eating? Independent  Indoor Mobility: Did the patient need assistance with walking from room to room (with or  without device)? Independent  Stairs: Did the patient need assistance with internal or external stairs (with or without device)? Independent  Functional Cognition: Did the patient need help planning regular tasks such as shopping or remembering to take medications? Independent  Home Assistive Devices / Equipment Home Assistive Devices/Equipment: None Home Equipment: Walker - 2 wheels  Prior Device Use: Indicate devices/aids used by the patient prior to current illness, exacerbation or injury? Walker (used after heart surgery; no AD prior to heart surgery)  Current Functional Level Cognition  Overall Cognitive Status: Within Functional Limits for tasks assessed Orientation Level: Oriented X4    Extremity Assessment (includes Sensation/Coordination)  Upper Extremity Assessment: Generalized weakness  Lower Extremity Assessment: Generalized weakness,LLE deficits/detail LLE Deficits / Details: Strength at hip 2/5 with AAROM at hip to 75 flex and 15 abd    ADLs       Mobility  Overal bed mobility: Needs Assistance Bed Mobility: Supine to Sit Supine to sit: Min assist,Mod assist General bed mobility comments: Increased time with cues for sequence and assist to manage L LE as well as to bring trunk to upright    Transfers  Overall transfer level: Needs assistance Equipment used: Rolling walker (2 wheeled) Transfers: Sit to/from Stand Sit to Stand: Min assist General transfer comment: cues for LE management and use of UEs to self assist.  Physical assist to bring wt up and fwd and to balance in standing    Ambulation / Gait / Stairs / Wheelchair Mobility  Ambulation/Gait Ambulation/Gait assistance: Min assist,Min guard Gait Distance (Feet): 70 Feet Assistive device: Rolling walker (2 wheeled) Gait Pattern/deviations: Step-to pattern,Decreased step length - right,Decreased step length - left,Shuffle,Trunk flexed General Gait Details: Increased time with cues for sequence, posture  and position from RW. Gait velocity: decr    Posture / Balance Balance Overall balance assessment: Needs assistance Sitting-balance support: No upper extremity supported,Feet supported Sitting balance-Leahy Scale: Good Standing balance support: Bilateral upper extremity supported Standing balance-Leahy Scale: Poor    Special needs/care consideration Skin Abrasion: elbow, knee/left; Surgical incision: left hip, Diabetic management novoLOG: 0-5 units: daily at bedtime; novoLOG 0-15 units: 3x daily at meals, External Urinary Catheter and Designated visitor Elion Hocker, wife   Previous Home Environment (from acute therapy documentation) Living Arrangements: Spouse/significant other  Lives With: Spouse Available Help at Discharge: Family,Available 24 hours/day Type of Home: House Home Layout: One level Home Access: Stairs to enter Entrance Stairs-Rails: None (rails by stairs in front of house) Entrance Stairs-Number of Steps: 1 in garage, 5 in front of house Bathroom Shower/Tub: Chiropodist: Standard Bathroom Accessibility: Yes How Accessible: Accessible via walker [place]: No  Discharge Living Setting Plans for Discharge Living Setting: Patient's home Type of Home at Discharge: House Discharge Home Layout: One level Discharge Home Access: Stairs to enter Entrance Stairs-Rails: None (rails by steps in front of house) Entrance Stairs-Number of Steps: 1 in garage, 5 in front Discharge Bathroom Shower/Tub: Tub/shower unit Discharge Bathroom Toilet: Standard Discharge Bathroom Accessibility: Yes How Accessible: Accessible via walker Does the patient have any problems obtaining your medications?: No  Social/Family/Support Systems Anticipated Caregiver: Tamari Busic, wife Anticipated Caregiver's Contact Information: [phone] Caregiver Availability: 24/7 Discharge Plan Discussed with Primary Caregiver: Yes Is Caregiver In Agreement with Plan?:  Yes Does Caregiver/Family have Issues with Lodging/Transportation while Pt is in Rehab?: No  Goals Patient/Family Goal for Rehab: Mod I-Supervision: PT/OT Expected length of stay: 7-10 days Pt/Family Agrees to Admission and willing to participate: Yes Program Orientation Provided & Reviewed with Pt/Caregiver Including Roles  & Responsibilities: Yes  Decrease burden of Care through IP rehab admission: NA   Possible need for SNF placement upon discharge: Not anticipated  Patient Condition: I have reviewed medical records from [facility], spoken with CSW, and patient and spouse. I discussed via phone for inpatient rehabilitation assessment.  Patient will benefit from ongoing PT and OT, can actively participate in 3 hours of therapy a day 5 days of the week, and can make measurable gains during the admission.  Patient will also benefit from the coordinated team approach during an Inpatient Acute Rehabilitation admission.  The patient  will receive intensive therapy as well as Rehabilitation physician, nursing, social worker, and care management interventions.  Due to safety, skin/wound care, disease management, medication administration, pain management and patient education the patient requires 24 hour a day rehabilitation nursing.  The patient is currently Min G-Mod A with mobility and basic ADLs.  Discharge setting and therapy post discharge at home with home health is anticipated.  Patient has agreed to participate in the Acute Inpatient Rehabilitation Program and will admit today.  Preadmission Screen Completed By:  Bethel Born, 03/22/2021 10:52 AM ______________________________________________________________________   Discussed status with Dr. Naaman Plummer on 03/22/21  at 10:52 AM and received approval for admission today.  Admission Coordinator:  Bethel Born, CCC-SLP, time 10:52 AM/Date 03/22/21    Assessment/Plan: Diagnosis: left hip fx, left THA 1. Does the need  for close, 24 hr/day Medical supervision in concert with the patient's rehab needs make it unreasonable for this patient to be served in a less intensive setting? Yes 2. Co-Morbidities requiring supervision/potential complications: PAD, HTN, CKD, CAD 3. Due to bladder management, bowel management, safety, skin/wound care, disease management, medication administration, pain management and patient education, does the patient require 24 hr/day rehab nursing? Yes 4. Does the patient require coordinated care of a physician, rehab nurse, PT, OT, and SLP to address physical and functional deficits in the context of the above medical diagnosis(es)? Yes Addressing deficits in the following areas: balance, endurance, locomotion, strength, transferring, bowel/bladder control, bathing, dressing, feeding, grooming, toileting and psychosocial support 5. Can the patient actively participate in an intensive therapy program of at least 3 hrs of therapy 5 days a week? Yes 6. The potential for patient to make measurable gains while on inpatient rehab is excellent 7. Anticipated functional outcomes upon discharge from inpatient rehab: modified independent PT, modified independent OT, n/a SLP 8. Estimated rehab length of stay to reach the above functional goals is: 7-10 days 9. Anticipated discharge destination: Home 10. Overall Rehab/Functional Prognosis: excellent   MD Signature: Meredith Staggers, MD, [facility] 03/22/2021

## 2021-03-20 NOTE — Progress Notes (Signed)
PROGRESS NOTE  DETRAVION TESTER [id number] DOB: April 20, 1951 DOA: 03/17/2021 PCP: [name], MD  HPI/Recap of past 24 hours: [name]:[id number] W Dominic Mendoza a 70 y.o.malewith medical history significant forcoronary artery disease status post CABG and bioprosthetic aortic valve replacement in September 2021, PAD, history of CVA, chronic kidney disease stage II, and hypertension, now presenting to the emergency department with proximal left leg pain after a fall. The patient reports that he been in his usual state of health and was trying to load a case of water bottles into his truck when he stumbled and fell onto his left side. He suffered some abrasions to his elbow and was experiencing severe pain at the proximal left lower extremity. He denies hitting his head or losing consciousness. Reports that he has not had any chest pain since his CABG last year and has been able to ascend a flight of stairs without any angina. Denies any recent fevers, chills, cough, shortness of breath, or leg swelling.  ED Course:Upon arrival to the ED, patient is found to be afebrile, saturating well on room air, and with stable blood pressure. EKG features sinus rhythm with chronic RBBB. Chest x-ray notable for low lung volumes and bibasilar atelectasis. Plain films of the left femur were inconclusive and CT demonstrates nondisplaced subcapital fracture of the left proximal femur. Chemistry panel notable for glucose 183 and creatinine 1.26. CBC with mild leukocytosis and normocytic anemia. Orthopedic surgery was consulted by the ED physician, patient was treated with morphine, and hospitalists called to admit.  03/20/21:  Left hip pain this morning prior to taking his pain medications.  Was seen by PT with recommendation for CIR.  CIR consulted for possible admission.  Assessment/Plan: Principal Problem:   Closed left hip fracture (HCC) Active Problems:   Hypertension   Diabetes mellitus without complication  (HCC)   CKD (chronic kidney disease), stage II   CAD (coronary artery disease)   History of CVA (cerebrovascular accident)   Left hip fracture  post repair on 03/18/2021 by Dr. [name] Management per orthopedic surgery. Pain management in place PT OT with orthopedic surgery's guidance  Acute blood loss anemia post surgery Drop in hemoglobin from 11-9.6 Continue to monitor H&H Transfuse as indicated  Improving leukocytosis likely reactive perioperatively WBC is downtrending. Afebrile, nonseptic appearing with no evidence of active infective process.  CAD  status post CABG in September 2021. Status-post CABG in September 2021  Resume antiplatelets when okay with surgery. Denies any anginal symptoms.  Type II DM  with hyperglycemia - A1c was 7.0% in January 2022  Insulin sliding scale  Hypomagnesemia Magnesium 1.5 Repleted  Recheck magnesium level in the morning  Hypertension  BP is currently stable. Continue home oral antihypertensives, hold ACE inhibitor due to uptrending creatinine. Continue to monitor vital signs  Ureteral spasm Pyridium 100 mg 3 times daily  CKD II  - SCr is 1.26 on admission, was 1.12 in April 2022  - Hold ACE-i preoperatively, monitor    PAD - Status-post left SFA atherectomy and stent in 2016  - No acute ischemia, hold Plavix and continue statin   History of CVA  - Continue statin Resume antiplatelets when okay with orthopedic surgery.   DVT prophylaxis: SCDs , pharmacological DVT prophylaxis when okay with orthopedic surgery. Code Status: Full  Level of Care: Level of care: Med-Surg Family Communication: None present  Disposition Plan:  Patient is from: home  Anticipated d/c is to:  CIR Anticipated d/c date is: 03/21/21 Patient currently:  Awaiting placement of CIR. Consults called: Orthopedic surgery            Objective: Vitals:   03/19/21 1740 03/19/21 2056 03/20/21 0540 03/20/21 1347  BP: (!) 154/61 (!)  149/65 132/65 (!) 125/53  Pulse: 70 72 64 64  Resp: 19 18 16 18   Temp: 98.4 F (36.9 C) 97.8 F (36.6 C) 98.4 F (36.9 C) 97.7 F (36.5 C)  TempSrc: Oral Oral Oral Oral  SpO2: 97% 95% 94% (!) 85%  Weight:      Height:        Intake/Output Summary (Last 24 hours) at 03/20/2021 1831 Last data filed at 03/20/2021 1823 Gross per 24 hour  Intake 1200 ml  Output 1275 ml  Net -75 ml   Filed Weights   03/17/21 2104 03/18/21 0216  Weight: 93.8 kg 91.9 kg    Exam:  . General: 70 y.o. year-old male well-appearing no acute distress.  He is alert and interactive. . Cardiovascular: Regular rate and rhythm no rubs or gallops. [name] Respiratory: Clear to auscultation no wheezes or rales.   . Abdomen: Soft nontender no bowel sounds present.   . Musculoskeletal: No lower extremity edema bilaterally. . Skin: No ulcerative lesions noted.   [name] Psychiatry: Mood is appropriate for condition and setting.   Data Reviewed: CBC: Recent Labs  Lab 03/17/21 2256 03/18/21 0311 03/19/21 0309 03/20/21 0230  WBC 11.1* 11.8* 16.4* 11.9*  NEUTROABS 8.5*  --   --   --   HGB 10.7* 11.1* 9.6* 9.2*  HCT 32.8* 35.0* 29.2* 28.4*  MCV 91.4 93.1 92.1 91.9  PLT 165 160 138* 528*   Basic Metabolic Panel: Recent Labs  Lab 03/17/21 2256 03/18/21 0311 03/19/21 0309 03/20/21 0230  NA 139 136 135 137  K 4.4 4.0 4.4 4.2  CL 103 105 104 101  CO2 28 23 26 27   GLUCOSE 183* 169* 275* 171*  BUN 24* 22 29* 31*  CREATININE 1.26* 1.13 1.34* 1.19  CALCIUM 9.4 8.9 8.3* 9.0  MG  --   --  1.5*  --   PHOS  --   --  4.1  --    GFR: Estimated Creatinine Clearance: 66.6 mL/min (by C-G formula based on SCr of 1.19 mg/dL). Liver Function Tests: Recent Labs  Lab 03/17/21 2256  AST 13*  ALT 13  ALKPHOS 83  BILITOT 0.5  PROT 7.0  ALBUMIN 3.9   No results for input(s): LIPASE, AMYLASE in the last 168 hours. No results for input(s): AMMONIA in the last 168 hours. Coagulation Profile: Recent Labs  Lab  03/18/21 0100  INR 1.0   Cardiac Enzymes: No results for input(s): CKTOTAL, CKMB, CKMBINDEX, TROPONINI in the last 168 hours. BNP (last 3 results) No results for input(s): PROBNP in the last 8760 hours. HbA1C: Recent Labs    03/19/21 0309  HGBA1C 8.1*   CBG: Recent Labs  Lab 03/19/21 1653 03/19/21 2125 03/20/21 0737 03/20/21 1204 03/20/21 1648  GLUCAP 177* 219* 179* 270* 212*   Lipid Profile: No results for input(s): CHOL, HDL, LDLCALC, TRIG, CHOLHDL, LDLDIRECT in the last 72 hours. Thyroid Function Tests: No results for input(s): TSH, T4TOTAL, FREET4, T3FREE, THYROIDAB in the last 72 hours. Anemia Panel: No results for input(s): VITAMINB12, FOLATE, FERRITIN, TIBC, IRON, RETICCTPCT in the last 72 hours. Urine analysis:    Component Value Date/Time   COLORURINE YELLOW 03/18/2021 [address] 03/18/2021 0043   LABSPEC 1.015 03/18/2021 0043   PHURINE 5.0 03/18/2021 0043  GLUCOSEU NEGATIVE 03/18/2021 0043   HGBUR SMALL (A) 03/18/2021 0043   BILIRUBINUR NEGATIVE 03/18/2021 0043   KETONESUR NEGATIVE 03/18/2021 0043   PROTEINUR NEGATIVE 03/18/2021 0043   NITRITE NEGATIVE 03/18/2021 0043   LEUKOCYTESUR NEGATIVE 03/18/2021 0043   Sepsis Labs: @LABRCNTIP (procalcitonin:4,lacticidven:4)  ) Recent Results (from the past 240 hour(s))  Resp Panel by RT-PCR (Flu A&B, Covid) Nasopharyngeal Swab     Status: None   Collection Time: 03/17/21 11:11 PM   Specimen: Nasopharyngeal Swab; Nasopharyngeal(NP) swabs in vial transport medium  Result Value Ref Range Status   SARS Coronavirus 2 by RT PCR NEGATIVE NEGATIVE Final    Comment: (NOTE) SARS-CoV-2 target nucleic acids are NOT DETECTED.  The SARS-CoV-2 RNA is generally detectable in upper respiratory specimens during the acute phase of infection. The lowest concentration of SARS-CoV-2 viral copies this assay can detect is 138 copies/mL. A negative result does not preclude SARS-Cov-2 infection and should not be  used as the sole basis for treatment or other patient management decisions. A negative result may occur with  improper specimen collection/handling, submission of specimen other than nasopharyngeal swab, presence of viral mutation(s) within the areas targeted by this assay, and inadequate number of viral copies(<138 copies/mL). A negative result must be combined with clinical observations, patient history, and epidemiological information. The expected result is Negative.  Fact Sheet for Patients:  EntrepreneurPulse.com.au  Fact Sheet for Healthcare Providers:  IncredibleEmployment.be  This test is no t yet approved or cleared by the [place] FDA and  has been authorized for detection and/or diagnosis of SARS-CoV-2 by FDA under an Emergency Use Authorization (EUA). This EUA will remain  in effect (meaning this test can be used) for the duration of the COVID-19 declaration under Section 564(b)(1) of the Act, 21 U.S.C.section 360bbb-3(b)(1), unless the authorization is terminated  or revoked sooner.       Influenza A by PCR NEGATIVE NEGATIVE Final   Influenza B by PCR NEGATIVE NEGATIVE Final    Comment: (NOTE) The Xpert Xpress SARS-CoV-2/FLU/RSV plus assay is intended as an aid in the diagnosis of influenza from Nasopharyngeal swab specimens and should not be used as a sole basis for treatment. Nasal washings and aspirates are unacceptable for Xpert Xpress SARS-CoV-2/FLU/RSV testing.  Fact Sheet for Patients: EntrepreneurPulse.com.au  Fact Sheet for Healthcare Providers: IncredibleEmployment.be  This test is not yet approved or cleared by the [place] FDA and has been authorized for detection and/or diagnosis of SARS-CoV-2 by FDA under an Emergency Use Authorization (EUA). This EUA will remain in effect (meaning this test can be used) for the duration of the COVID-19 declaration under Section  564(b)(1) of the Act, 21 U.S.C. section 360bbb-3(b)(1), unless the authorization is terminated or revoked.  Performed at [facility], [place] [address], [place], [place] [address]       Studies: No results found.  Scheduled Meds: . amLODipine  5 mg Oral Daily  . aspirin EC  325 mg Oral Q breakfast  . docusate sodium  100 mg Oral BID  . famotidine  20 mg Oral BID  . insulin aspart  0-15 Units Subcutaneous TID WC  . insulin aspart  0-5 Units Subcutaneous QHS  . metoprolol succinate  25 mg Oral Daily  . rosuvastatin  10 mg Oral QHS  . senna  1 tablet Oral QHS    Continuous Infusions: . methocarbamol (ROBAXIN) IV       LOS: 2 days     Kayleen Memos, MD [facility] Pager  [phone]  If 7PM-7AM, please contact night-coverage [email or web address] Password [facility] 03/20/2021, 6:31 PM

## 2021-03-20 NOTE — Progress Notes (Signed)
Inpatient Rehab Admissions:  Inpatient Rehab Consult received.  I spoke with patient and wife, Linus Orn on the telephone for rehabilitation assessment and to discuss goals and expectations of an inpatient rehab admission.  Both acknowledged understanding of goals and expectations. Both interested in pt pursuing CIR.  Will continue to follow.  Signed: Gayland Curry, [place], [place] Admissions Coordinator [phone]

## 2021-03-20 NOTE — Progress Notes (Signed)
Physical Therapy Treatment Patient Details Name: Dominic Mendoza MRN: [id number] DOB: 12-Nov-1950 Today's Date: 03/20/2021    History of Present Illness Pt s/p fall with L hip fx and now s/p L THR by anterior direct approach.  Pt with hx of DM, CAD, recent CABG, aoric valve replacement, PAD and CKD stage II.    PT Comments    Pt very cooperative and progressing with mobility.  Pt fatigues easily but this date with noted improvement in distance ambulated and gait stability.   Follow Up Recommendations  CIR     Equipment Recommendations  None recommended by PT    Recommendations for Other Services       Precautions / Restrictions Precautions Precautions: Fall Restrictions Weight Bearing Restrictions: No Other Position/Activity Restrictions: WBAT    Mobility  Bed Mobility               General bed mobility comments: Pt up in chair and requests back to same    Transfers Overall transfer level: Needs assistance Equipment used: Rolling walker (2 wheeled) Transfers: Sit to/from Stand Sit to Stand: Min assist;Mod assist         General transfer comment: cues for LE management and use of UEs to self assist.  Physical assist to bring wt up and fwd and to balance in standing  Ambulation/Gait Ambulation/Gait assistance: Min assist Gait Distance (Feet): 42 Feet Assistive device: Rolling walker (2 wheeled) Gait Pattern/deviations: Step-to pattern;Decreased step length - right;Decreased step length - left;Shuffle;Trunk flexed Gait velocity: decr   General Gait Details: cues for sequence, posture and position from RW.  Physical assist for balance, support, and RW management   Stairs             Wheelchair Mobility    Modified Rankin (Stroke Patients Only)       Balance Overall balance assessment: Needs assistance Sitting-balance support: No upper extremity supported;Feet supported Sitting balance-Leahy Scale: Good     Standing balance support: Bilateral  upper extremity supported Standing balance-Leahy Scale: Poor                              Cognition Arousal/Alertness: Awake/alert Behavior During Therapy: WFL for tasks assessed/performed Overall Cognitive Status: Within Functional Limits for tasks assessed                                        Exercises      General Comments        Pertinent Vitals/Pain Pain Assessment: 0-10 Pain Score: 5  Pain Location: L hip/groin Pain Descriptors / Indicators: Aching;Sore;Burning Pain Intervention(s): Limited activity within patient's tolerance;Monitored during session;Premedicated before session;Ice applied    Home Living                      Prior Function            PT Goals (current goals can now be found in the care plan section) Acute Rehab PT Goals Patient Stated Goal: Regain IND and walk safely again PT Goal Formulation: With patient Time For Goal Achievement: 03/26/21 Potential to Achieve Goals: Good Progress towards PT goals: Progressing toward goals    Frequency    Min 5X/week      PT Plan Current plan remains appropriate    Co-evaluation  AM-PAC PT "6 Clicks" Mobility   Outcome Measure  Help needed turning from your back to your side while in a flat bed without using bedrails?: A Lot Help needed moving from lying on your back to sitting on the side of a flat bed without using bedrails?: A Lot Help needed moving to and from a bed to a chair (including a wheelchair)?: A Little Help needed standing up from a chair using your arms (e.g., wheelchair or bedside chair)?: A Lot Help needed to walk in hospital room?: A Lot Help needed climbing 3-5 steps with a railing? : A Lot 6 Click Score: 13    End of Session Equipment Utilized During Treatment: Gait belt Activity Tolerance: Patient limited by fatigue Patient left: with call bell/phone within reach;with family/visitor present;in bed;with bed alarm  set;with nursing/sitter in room Nurse Communication: Mobility status PT Visit Diagnosis: Unsteadiness on feet (R26.81);Muscle weakness (generalized) (M62.81);Difficulty in walking, not elsewhere classified (R26.2);Pain Pain - Right/Left: Left Pain - part of body: Hip     Time: 4492-0100 PT Time Calculation (min) (ACUTE ONLY): 18 min  Charges:  $Gait Training: 8-22 mins                     [address] Pager [phone] Office [phone]    Bryse Blanchette 03/20/2021, 1:03 PM

## 2021-03-20 NOTE — Plan of Care (Signed)
  Problem: Pain Managment: Goal: General experience of comfort will improve Outcome: Progressing   Problem: Safety: Goal: Ability to remain free from injury will improve Outcome: Progressing   

## 2021-03-20 NOTE — Plan of Care (Signed)
  Problem: Clinical Measurements: Goal: Cardiovascular complication will be avoided Outcome: Progressing   Problem: Activity: Goal: Risk for activity intolerance will decrease Outcome: Progressing   Problem: Pain Managment: Goal: General experience of comfort will improve Outcome: Progressing   Problem: Safety: Goal: Ability to remain free from injury will improve Outcome: Progressing   Problem: Activity: Goal: Ability to ambulate and perform ADLs will improve Outcome: Progressing

## 2021-03-20 NOTE — Progress Notes (Signed)
Physical Therapy Treatment Patient Details Name: Dominic Mendoza MRN: [id number] DOB: [birth year] Today's Date: 03/20/2021    History of Present Illness Pt s/p fall with L hip fx and now s/p L THR by anterior direct approach.  Pt with hx of DM, CAD, recent CABG, aoric valve replacement, PAD and CKD stage II.    PT Comments    Pt continues very cooperative and progressing with mobility but fatigues easily with activity.  This pm, pt up to ambulate increased but still limited distance in hall and performed therex program with assist.     Follow Up Recommendations  CIR     Equipment Recommendations  None recommended by PT    Recommendations for Other Services       Precautions / Restrictions Precautions Precautions: Fall Restrictions Weight Bearing Restrictions: No Other Position/Activity Restrictions: WBAT    Mobility  Bed Mobility               General bed mobility comments: Pt up in chair and requests back to same    Transfers Overall transfer level: Needs assistance Equipment used: Rolling walker (2 wheeled) Transfers: Sit to/from Stand Sit to Stand: Min assist         General transfer comment: cues for LE management and use of UEs to self assist.  Physical assist to bring wt up and fwd and to balance in standing  Ambulation/Gait Ambulation/Gait assistance: Min assist Gait Distance (Feet): 68 Feet Assistive device: Rolling walker (2 wheeled) Gait Pattern/deviations: Step-to pattern;Decreased step length - right;Decreased step length - left;Shuffle;Trunk flexed Gait velocity: decr   General Gait Details: cues for sequence, posture and position from RW.  Physical assist for balance, support, and RW management   Stairs             Wheelchair Mobility    Modified Rankin (Stroke Patients Only)       Balance Overall balance assessment: Needs assistance Sitting-balance support: No upper extremity supported;Feet supported Sitting balance-Leahy  Scale: Good     Standing balance support: Bilateral upper extremity supported Standing balance-Leahy Scale: Poor                              Cognition Arousal/Alertness: Awake/alert Behavior During Therapy: WFL for tasks assessed/performed Overall Cognitive Status: Within Functional Limits for tasks assessed                                        Exercises Total Joint Exercises Ankle Circles/Pumps: AROM;Both;15 reps;Supine Quad Sets: AROM;Both;10 reps;Supine Heel Slides: AAROM;Left;20 reps;Supine Hip ABduction/ADduction: AAROM;Left;15 reps;Supine    General Comments        Pertinent Vitals/Pain Pain Assessment: 0-10 Pain Score: 7  Pain Location: L hip/groin with ambulation; min pain at rest Pain Descriptors / Indicators: Aching;Sore;Burning Pain Intervention(s): Limited activity within patient's tolerance;Monitored during session;Premedicated before session;Ice applied    Home Living   Living Arrangements: Spouse/significant other Available Help at Discharge: Family;Available 24 hours/day Type of Home: House Home Access: Stairs to enter Entrance Stairs-Rails: None (rails by stairs in front of house) Home Layout: One level        Prior Function            PT Goals (current goals can now be found in the care plan section) Acute Rehab PT Goals Patient Stated Goal: Regain IND and walk safely again  PT Goal Formulation: With patient Time For Goal Achievement: 03/26/21 Potential to Achieve Goals: Good Progress towards PT goals: Progressing toward goals    Frequency    Min 5X/week      PT Plan Current plan remains appropriate    Co-evaluation              AM-PAC PT "6 Clicks" Mobility   Outcome Measure  Help needed turning from your back to your side while in a flat bed without using bedrails?: A Lot Help needed moving from lying on your back to sitting on the side of a flat bed without using bedrails?: A Lot Help  needed moving to and from a bed to a chair (including a wheelchair)?: A Little Help needed standing up from a chair using your arms (e.g., wheelchair or bedside chair)?: A Little Help needed to walk in hospital room?: A Little Help needed climbing 3-5 steps with a railing? : A Lot 6 Click Score: 15    End of Session Equipment Utilized During Treatment: Gait belt Activity Tolerance: Patient limited by fatigue Patient left: with call bell/phone within reach;with family/visitor present;in bed;with bed alarm set;with nursing/sitter in room Nurse Communication: Mobility status PT Visit Diagnosis: Unsteadiness on feet (R26.81);Muscle weakness (generalized) (M62.81);Difficulty in walking, not elsewhere classified (R26.2);Pain Pain - Right/Left: Left Pain - part of body: Hip     Time: 5456-2563 PT Time Calculation (min) (ACUTE ONLY): 32 min  Charges:  $Gait Training: 8-22 mins $Therapeutic Exercise: 8-22 mins                     [place] Pager [phone] Office [phone]    Senon Nixon 03/20/2021, 4:08 PM

## 2021-03-20 NOTE — Progress Notes (Signed)
   Subjective: 2 Days Post-Op Procedure(s) (LRB): TOTAL HIP ARTHROPLASTY ANTERIOR APPROACH (Left)  Pt doing well Therapy going well Mild soreness in left hip currently Denies any new symptoms or issues Patient reports pain as mild.  Objective:   VITALS:   Vitals:   03/19/21 2056 03/20/21 0540  BP: (!) 149/65 132/65  Pulse: 72 64  Resp: 18 16  Temp: 97.8 F (36.6 C) 98.4 F (36.9 C)  SpO2: 95% 94%    Left hip incision healing well Dressing intact No signs of erythema or drainage Good early rom with left lower leg and ambulation  LABS Recent Labs    03/18/21 0311 03/19/21 0309 03/20/21 0230  HGB 11.1* 9.6* 9.2*  HCT 35.0* 29.2* 28.4*  WBC 11.8* 16.4* 11.9*  PLT 160 138* 137*    Recent Labs    03/18/21 0311 03/19/21 0309 03/20/21 0230  NA 136 135 137  K 4.0 4.4 4.2  BUN 22 29* 31*  CREATININE 1.13 1.34* 1.19  GLUCOSE 169* 275* 171*     Assessment/Plan: 2 Days Post-Op Procedure(s) (LRB): TOTAL HIP ARTHROPLASTY ANTERIOR APPROACH (Left) Overall pt doing well Clear from orthopedic standpoint for d/c home  Continue PT/OT Pain management as needed     Kathrynn Speed, MPAS [place] [facility] now [employer] [facility] [address], [place], [place] [address] Phone: [phone] [email or web address] Facebook  [employer]

## 2021-03-21 DIAGNOSIS — S72002A Fracture of unspecified part of neck of left femur, initial encounter for closed fracture: Secondary | ICD-10-CM | POA: Diagnosis not present

## 2021-03-21 LAB — CBC
HCT: 27.4 % — ABNORMAL LOW (ref 39.0–52.0)
Hemoglobin: 8.8 g/dL — ABNORMAL LOW (ref 13.0–17.0)
MCH: 29.9 pg (ref 26.0–34.0)
MCHC: 32.1 g/dL (ref 30.0–36.0)
MCV: 93.2 fL (ref 80.0–100.0)
Platelets: 140 10*3/uL — ABNORMAL LOW (ref 150–400)
RBC: 2.94 MIL/uL — ABNORMAL LOW (ref 4.22–5.81)
RDW: 14.8 % (ref 11.5–15.5)
WBC: 10.5 10*3/uL (ref 4.0–10.5)
nRBC: 0 % (ref 0.0–0.2)

## 2021-03-21 LAB — BASIC METABOLIC PANEL
Anion gap: 7 (ref 5–15)
BUN: 26 mg/dL — ABNORMAL HIGH (ref 8–23)
CO2: 25 mmol/L (ref 22–32)
Calcium: 8.7 mg/dL — ABNORMAL LOW (ref 8.9–10.3)
Chloride: 101 mmol/L (ref 98–111)
Creatinine, Ser: 1 mg/dL (ref 0.61–1.24)
GFR, Estimated: >60 mL/min (ref >60)
Glucose, Bld: 223 mg/dL — ABNORMAL HIGH (ref 70–99)
Potassium: 4.4 mmol/L (ref 3.5–5.1)
Sodium: 133 mmol/L — ABNORMAL LOW (ref 135–145)

## 2021-03-21 LAB — MAGNESIUM: Magnesium: 1.8 mg/dL (ref 1.7–2.4)

## 2021-03-21 LAB — GLUCOSE, CAPILLARY
Glucose-Capillary: 189 mg/dL — ABNORMAL HIGH (ref 70–99)
Glucose-Capillary: 236 mg/dL — ABNORMAL HIGH (ref 70–99)
Glucose-Capillary: 221 mg/dL — ABNORMAL HIGH (ref 70–99)
Glucose-Capillary: 316 mg/dL — ABNORMAL HIGH (ref 70–99)

## 2021-03-21 LAB — HEMOGLOBIN AND HEMATOCRIT, BLOOD
HCT: 25.7 % — ABNORMAL LOW (ref 39.0–52.0)
Hemoglobin: 8.2 g/dL — ABNORMAL LOW (ref 13.0–17.0)

## 2021-03-21 NOTE — Plan of Care (Signed)
  Problem: Nutrition: Goal: Adequate nutrition will be maintained Outcome: Progressing   Problem: Pain Managment: Goal: General experience of comfort will improve Outcome: Progressing   Problem: Safety: Goal: Ability to remain free from injury will improve Outcome: Progressing   

## 2021-03-21 NOTE — Progress Notes (Signed)
Physical Therapy Treatment Patient Details Name: Dominic Mendoza MRN: [id number] DOB: Mar 10, 1951 Today's Date: 03/21/2021    History of Present Illness Pt s/p fall with L hip fx and now s/p L THR by anterior direct approach.  Pt with hx of DM, CAD, recent CABG, aoric valve replacement, PAD and CKD stage II.    PT Comments    Pt continues very motivated and progressing slowly but steadily with all mobility tasks as well as slowly improving endurance.  Pt hopeful for transfer to CIR tomorrow.   Follow Up Recommendations  CIR     Equipment Recommendations  None recommended by PT    Recommendations for Other Services       Precautions / Restrictions Precautions Precautions: Fall Restrictions Weight Bearing Restrictions: No Other Position/Activity Restrictions: WBAT    Mobility  Bed Mobility Overal bed mobility: Needs Assistance Bed Mobility: Supine to Sit     Supine to sit: Min assist;Mod assist     General bed mobility comments: Increased time with cues for sequence and assist to manage L LE as well as to bring trunk to upright    Transfers Overall transfer level: Needs assistance Equipment used: Rolling walker (2 wheeled) Transfers: Sit to/from Stand Sit to Stand: Min assist         General transfer comment: cues for LE management and use of UEs to self assist.  Physical assist to bring wt up and fwd and to balance in standing  Ambulation/Gait Ambulation/Gait assistance: Min assist;Min guard Gait Distance (Feet): 70 Feet Assistive device: Rolling walker (2 wheeled) Gait Pattern/deviations: Step-to pattern;Decreased step length - right;Decreased step length - left;Shuffle;Trunk flexed Gait velocity: decr   General Gait Details: Increased time with cues for sequence, posture and position from RW.   Stairs             Wheelchair Mobility    Modified Rankin (Stroke Patients Only)       Balance Overall balance assessment: Needs  assistance Sitting-balance support: No upper extremity supported;Feet supported Sitting balance-Leahy Scale: Good     Standing balance support: Bilateral upper extremity supported Standing balance-Leahy Scale: Poor                              Cognition Arousal/Alertness: Awake/alert Behavior During Therapy: WFL for tasks assessed/performed Overall Cognitive Status: Within Functional Limits for tasks assessed                                        Exercises      General Comments        Pertinent Vitals/Pain Pain Assessment: 0-10 Pain Score: 5  Pain Location: L hip/groin with ambulation; min pain at rest Pain Descriptors / Indicators: Aching;Sore;Burning Pain Intervention(s): Limited activity within patient's tolerance;Monitored during session;Premedicated before session    Home Living                      Prior Function            PT Goals (current goals can now be found in the care plan section) Acute Rehab PT Goals Patient Stated Goal: Regain IND and walk safely again PT Goal Formulation: With patient Time For Goal Achievement: 03/26/21 Potential to Achieve Goals: Good Progress towards PT goals: Progressing toward goals    Frequency    Min 5X/week  PT Plan Current plan remains appropriate    Co-evaluation              AM-PAC PT "6 Clicks" Mobility   Outcome Measure  Help needed turning from your back to your side while in a flat bed without using bedrails?: A Lot Help needed moving from lying on your back to sitting on the side of a flat bed without using bedrails?: A Lot Help needed moving to and from a bed to a chair (including a wheelchair)?: A Little Help needed standing up from a chair using your arms (e.g., wheelchair or bedside chair)?: A Little Help needed to walk in hospital room?: A Little Help needed climbing 3-5 steps with a railing? : A Lot 6 Click Score: 15    End of Session Equipment  Utilized During Treatment: Gait belt Activity Tolerance: Patient limited by fatigue Patient left: with call bell/phone within reach;with chair alarm set;in chair Nurse Communication: Mobility status PT Visit Diagnosis: Unsteadiness on feet (R26.81);Muscle weakness (generalized) (M62.81);Difficulty in walking, not elsewhere classified (R26.2);Pain Pain - Right/Left: Left Pain - part of body: Hip     Time: 7564-3329 PT Time Calculation (min) (ACUTE ONLY): 33 min  Charges:  $Gait Training: 23-37 mins                     [place] Pager [phone] Office [phone]    [name] 03/21/2021, 5:19 PM

## 2021-03-21 NOTE — Progress Notes (Signed)
PROGRESS NOTE  Dominic Mendoza UMP:[id number] DOB: 25-May-1951 DOA: 03/17/2021 PCP: [name], MD  HPI/Recap of past 24 hours: QMG:QQPYP Dominic Millsis a 70 y.o.malewith medical history significant forcoronary artery disease status post CABG and bioprosthetic aortic valve replacement in September 2021, PAD, history of CVA, chronic kidney disease stage II, and hypertension, now presenting to the emergency department with proximal left leg pain after a fall. The patient reports that he been in his usual state of health and was trying to load a case of water bottles into his truck when he stumbled and fell onto his left side. He suffered some abrasions to his elbow and was experiencing severe pain at the proximal left lower extremity. He denies hitting his head or losing consciousness. Reports that he has not had any chest pain since his CABG last year and has been able to ascend a flight of stairs without any angina. Denies any recent fevers, chills, cough, shortness of breath, or leg swelling.  ED Course:Upon arrival to the ED, patient is found to be afebrile, saturating well on room air, and with stable blood pressure. EKG features sinus rhythm with chronic RBBB. Chest x-ray notable for low lung volumes and bibasilar atelectasis. Plain films of the left femur were inconclusive and CT demonstrates nondisplaced subcapital fracture of the left proximal femur. Chemistry panel notable for glucose 183 and creatinine 1.26. CBC with mild leukocytosis and normocytic anemia. Orthopedic surgery was consulted by the ED physician, patient was treated with morphine, and hospitalists called to admit.  03/18/2021 had displaced left femoral neck fracture repair, left total hip arthroplasty anterior approach by Dr. [name].  03/21/21:  POD #3.  He is alert and oriented x3 at the time of this visit.  He has had some confusions at night.  Reports mild pain on his left hip.  Was seen by the inpatient rehab team  yesterday, awaiting insurance authorization.  Assessment/Plan: Principal Problem:   Closed left hip fracture (HCC) Active Problems:   Hypertension   Diabetes mellitus without complication (HCC)   CKD (chronic kidney disease), stage II   CAD (coronary artery disease)   History of CVA (cerebrovascular accident)   Left hip fracture  post repair on 03/18/2021 by Dr. [name] POD #3. Management per orthopedic surgery. Pain management in place PT OT with orthopedic surgery's guidance recommending CIR. Awaiting insurance authorization for admission STIR.  Acute blood loss anemia post op. Drop in hemoglobin this morning 8.8 from 9.2. Hemoglobin 11.1 on 03/18/2021. Repeat H&H this afternoon. Transfuse when indicated.  Resolved leukocytosis likely reactive perioperatively Afebrile, nonseptic appearing with no evidence of active infective process. Currently afebrile with no leukocytosis.  CAD status post CABG in September 2021. Status-post CABG in September 2021  Resume antiplatelets when okay with surgery. He is currently on the full dose aspirin 325 mg daily. We will ask surgery if it is okay to start home Plavix. Denies any anginal symptoms.  Type II DM  with hyperglycemia - A1c was 7.0% in January 2022  Continue insulin sliding scale  Resolved post repletion: Hypomagnesemia Magnesium 1.5>> 1.8.  Hypertension  Transient uncontrolled hypertension likely contributed by pain Control pain Continue home oral antihypertensives, hold ACE inhibitor due to uptrending creatinine. Continue to monitor vital signs  Ureteral spasm Has received Pyridium 100 mg 3 times daily x3 doses.  CKD II  - SCr is 1.26 on admission, was 1.12 in April 2022  Back to his baseline creatinine 1.0 with GFR greater than 60. - Hold ACE-i  preoperatively, monitor   Continue to avoid nephrotoxic agents.  PAD - Status-post left SFA atherectomy and stent in 2016  - No acute ischemia On full dose  aspirin We will ask surgery if is okay to restart home Plavix.  History of CVA  - Continue statin Resume DAPT when okay with orthopedic surgery.   DVT prophylaxis: SCDs , pharmacological DVT prophylaxis when okay with orthopedic surgery. Code Status: Full  Level of Care: Level of care: Med-Surg Family Communication: None present  Disposition Plan:  Patient is from: home  Anticipated d/c is to:  CIR Anticipated d/c date is: 03/22/21 Patient currently:  Awaiting placement of CIR. Consults called: Orthopedic surgery            Objective: Vitals:   03/20/21 0540 03/20/21 1347 03/20/21 2117 03/21/21 0507  BP: 132/65 (!) 125/53 (!) 150/62 (!) 175/75  Pulse: 64 64 62 68  Resp: 16 18 16 17   Temp: 98.4 F (36.9 C) 97.7 F (36.5 C) 98 F (36.7 C) 98.4 F (36.9 C)  TempSrc: Oral Oral Oral Oral  SpO2: 94% (!) 85% 94% 90%  Weight:      Height:        Intake/Output Summary (Last 24 hours) at 03/21/2021 0911 Last data filed at 03/21/2021 0740 Gross per 24 hour  Intake 720 ml  Output 775 ml  Net -55 ml   Filed Weights   03/17/21 2104 03/18/21 0216  Weight: 93.8 kg 91.9 kg    Exam:  . General: 70 y.o. year-old male pleasant in no acute distress.  He is alert and oriented x3.   . Cardiovascular: Regular rate and rhythm no rubs or gallops. [name] Respiratory: Clear to auscultation no wheezes or rales. . Abdomen: Soft with a normal bowel sounds present.   . Musculoskeletal: No lower extremity edema bilaterally. . Skin: No ulcerative lesions noted. [name] Psychiatry: Mood is appropriate for condition setting.   Data Reviewed: CBC: Recent Labs  Lab 03/17/21 2256 03/18/21 0311 03/19/21 0309 03/20/21 0230 03/21/21 0308  WBC 11.1* 11.8* 16.4* 11.9* 10.5  NEUTROABS 8.5*  --   --   --   --   HGB 10.7* 11.1* 9.6* 9.2* 8.8*  HCT 32.8* 35.0* 29.2* 28.4* 27.4*  MCV 91.4 93.1 92.1 91.9 93.2  PLT 165 160 138* 137* 175*   Basic Metabolic Panel: Recent Labs  Lab  03/17/21 2256 03/18/21 0311 03/19/21 0309 03/20/21 0230 03/21/21 0745  NA 139 136 135 137 133*  K 4.4 4.0 4.4 4.2 4.4  CL 103 105 104 101 101  CO2 28 23 26 27 25   GLUCOSE 183* 169* 275* 171* 223*  BUN 24* 22 29* 31* 26*  CREATININE 1.26* 1.13 1.34* 1.19 1.00  CALCIUM 9.4 8.9 8.3* 9.0 8.7*  MG  --   --  1.5*  --  1.8  PHOS  --   --  4.1  --   --    GFR: Estimated Creatinine Clearance: 79.2 mL/min (by C-G formula based on SCr of 1 mg/dL). Liver Function Tests: Recent Labs  Lab 03/17/21 2256  AST 13*  ALT 13  ALKPHOS 83  BILITOT 0.5  PROT 7.0  ALBUMIN 3.9   No results for input(s): LIPASE, AMYLASE in the last 168 hours. No results for input(s): AMMONIA in the last 168 hours. Coagulation Profile: Recent Labs  Lab 03/18/21 0100  INR 1.0   Cardiac Enzymes: No results for input(s): CKTOTAL, CKMB, CKMBINDEX, TROPONINI in the last 168 hours. BNP (last 3 results)  No results for input(s): PROBNP in the last 8760 hours. HbA1C: Recent Labs    03/19/21 0309  HGBA1C 8.1*   CBG: Recent Labs  Lab 03/20/21 0737 03/20/21 1204 03/20/21 1648 03/20/21 2011 03/21/21 0726  GLUCAP 179* 270* 212* 241* 189*   Lipid Profile: No results for input(s): CHOL, HDL, LDLCALC, TRIG, CHOLHDL, LDLDIRECT in the last 72 hours. Thyroid Function Tests: No results for input(s): TSH, T4TOTAL, FREET4, T3FREE, THYROIDAB in the last 72 hours. Anemia Panel: No results for input(s): VITAMINB12, FOLATE, FERRITIN, TIBC, IRON, RETICCTPCT in the last 72 hours. Urine analysis:    Component Value Date/Time   COLORURINE YELLOW 03/18/2021 0043   APPEARANCEUR CLEAR 03/18/2021 0043   LABSPEC 1.015 03/18/2021 0043   PHURINE 5.0 03/18/2021 0043   GLUCOSEU NEGATIVE 03/18/2021 0043   HGBUR SMALL (A) 03/18/2021 0043   BILIRUBINUR NEGATIVE 03/18/2021 0043   KETONESUR NEGATIVE 03/18/2021 0043   PROTEINUR NEGATIVE 03/18/2021 0043   NITRITE NEGATIVE 03/18/2021 0043   LEUKOCYTESUR NEGATIVE 03/18/2021 0043    Sepsis Labs: @LABRCNTIP (procalcitonin:4,lacticidven:4)  ) Recent Results (from the past 240 hour(s))  Resp Panel by RT-PCR (Flu A&B, Covid) Nasopharyngeal Swab     Status: None   Collection Time: 03/17/21 11:11 PM   Specimen: Nasopharyngeal Swab; Nasopharyngeal(NP) swabs in vial transport medium  Result Value Ref Range Status   SARS Coronavirus 2 by RT PCR NEGATIVE NEGATIVE Final    Comment: (NOTE) SARS-CoV-2 target nucleic acids are NOT DETECTED.  The SARS-CoV-2 RNA is generally detectable in upper respiratory specimens during the acute phase of infection. The lowest concentration of SARS-CoV-2 viral copies this assay can detect is 138 copies/mL. A negative result does not preclude SARS-Cov-2 infection and should not be used as the sole basis for treatment or other patient management decisions. A negative result may occur with  improper specimen collection/handling, submission of specimen other than nasopharyngeal swab, presence of viral mutation(s) within the areas targeted by this assay, and inadequate number of viral copies(<138 copies/mL). A negative result must be combined with clinical observations, patient history, and epidemiological information. The expected result is Negative.  Fact Sheet for Patients:  EntrepreneurPulse.com.au  Fact Sheet for Healthcare Providers:  IncredibleEmployment.be  This test is no t yet approved or cleared by the [place] FDA and  has been authorized for detection and/or diagnosis of SARS-CoV-2 by FDA under an Emergency Use Authorization (EUA). This EUA will remain  in effect (meaning this test can be used) for the duration of the COVID-19 declaration under Section 564(b)(1) of the Act, 21 U.S.C.section 360bbb-3(b)(1), unless the authorization is terminated  or revoked sooner.       Influenza A by PCR NEGATIVE NEGATIVE Final   Influenza B by PCR NEGATIVE NEGATIVE Final    Comment: (NOTE) The  Xpert Xpress SARS-CoV-2/FLU/RSV plus assay is intended as an aid in the diagnosis of influenza from Nasopharyngeal swab specimens and should not be used as a sole basis for treatment. Nasal washings and aspirates are unacceptable for Xpert Xpress SARS-CoV-2/FLU/RSV testing.  Fact Sheet for Patients: EntrepreneurPulse.com.au  Fact Sheet for Healthcare Providers: IncredibleEmployment.be  This test is not yet approved or cleared by the [place] FDA and has been authorized for detection and/or diagnosis of SARS-CoV-2 by FDA under an Emergency Use Authorization (EUA). This EUA will remain in effect (meaning this test can be used) for the duration of the COVID-19 declaration under Section 564(b)(1) of the Act, 21 U.S.C. section 360bbb-3(b)(1), unless the authorization is terminated or revoked.  Performed  at [facility], [place] [address], [place], [place] [address]       Studies: No results found.  Scheduled Meds: . amLODipine  5 mg Oral Daily  . aspirin EC  325 mg Oral Q breakfast  . docusate sodium  100 mg Oral BID  . famotidine  20 mg Oral BID  . insulin aspart  0-15 Units Subcutaneous TID WC  . insulin aspart  0-5 Units Subcutaneous QHS  . metoprolol succinate  25 mg Oral Daily  . rosuvastatin  10 mg Oral QHS  . senna  1 tablet Oral QHS    Continuous Infusions: . methocarbamol (ROBAXIN) IV       LOS: 3 days     Kayleen Memos, MD [facility] Pager [phone]  If 7PM-7AM, please contact night-coverage [email or web address] Password [facility] 03/21/2021, 9:11 AM

## 2021-03-21 NOTE — Progress Notes (Signed)
   Subjective: 3 Days Post-Op Procedure(s) (LRB): TOTAL HIP ARTHROPLASTY ANTERIOR APPROACH (Left) Patient reports pain as mild.   Patient seen in rounds for Dr. [name]. Patient resting in bed on exam this morning. On the phone with his wife. His only concern this morning is that the room is too cold.   Objective: Vital signs in last 24 hours: Temp:  [97.7 F (36.5 C)-98.4 F (36.9 C)] 98.4 F (36.9 C) (05/22 0507) Pulse Rate:  [62-68] 68 (05/22 0507) Resp:  [16-18] 17 (05/22 0507) BP: (125-175)/(53-75) 175/75 (05/22 0507) SpO2:  [85 %-94 %] 90 % (05/22 0507)  Intake/Output from previous day:  Intake/Output Summary (Last 24 hours) at 03/21/2021 0950 Last data filed at 03/21/2021 0740 Gross per 24 hour  Intake 360 ml  Output 775 ml  Net -415 ml     Intake/Output this shift: No intake/output data recorded.  Labs: Recent Labs    03/19/21 0309 03/20/21 0230 03/21/21 0308  HGB 9.6* 9.2* 8.8*   Recent Labs    03/20/21 0230 03/21/21 0308  WBC 11.9* 10.5  RBC 3.09* 2.94*  HCT 28.4* 27.4*  PLT 137* 140*   Recent Labs    03/20/21 0230 03/21/21 0745  NA 137 133*  K 4.2 4.4  CL 101 101  CO2 27 25  BUN 31* 26*  CREATININE 1.19 1.00  GLUCOSE 171* 223*  CALCIUM 9.0 8.7*   No results for input(s): LABPT, INR in the last 72 hours.  Exam: General - Patient is Alert and Oriented Extremity - Neurologically intact Sensation intact distally Intact pulses distally Dorsiflexion/Plantar flexion intact Dressing - dressing C/D/I Motor Function - intact, moving foot and toes well on exam.   Past Medical History:  Diagnosis Date  . Arthritis   . Cancer [facility])    prostate  . Diabetes mellitus without complication ([place])   . Dyspnea   . GERD (gastroesophageal reflux disease)   . Hypertension   . Smokers' cough ([place])     Assessment/Plan: 3 Days Post-Op Procedure(s) (LRB): TOTAL HIP ARTHROPLASTY ANTERIOR APPROACH (Left) Principal Problem:   Closed left hip  fracture (HCC) Active Problems:   Hypertension   Diabetes mellitus without complication (HCC)   CKD (chronic kidney disease), stage II   CAD (coronary artery disease)   History of CVA (cerebrovascular accident)  Estimated body mass index is 29.92 kg/m as calculated from the following:   Height as of this encounter: 5\' 9"  (1.753 m).   Weight as of this encounter: 91.9 kg. Advance diet Up with therapy  DVT Prophylaxis - Aspirin Weight bearing as tolerated.  Hemoglobin stable at 8.8 this AM, down from 9.6 preoperatively. ABLA  Patient is progressing well in terms of his hip. Continue working with PT. Discharge per medicine when medically stable and disposition determined. Ready for discharge from an orthopaedic standpoint.   Griffith Citron, PA-C Orthopedic Surgery [phone] 03/21/2021, 9:50 AM

## 2021-03-22 ENCOUNTER — Other Ambulatory Visit: Payer: Self-pay

## 2021-03-22 ENCOUNTER — Inpatient Hospital Stay (HOSPITAL_COMMUNITY): Payer: Medicare Other

## 2021-03-22 ENCOUNTER — Encounter (HOSPITAL_COMMUNITY): Payer: Self-pay | Admitting: Physical Medicine & Rehabilitation

## 2021-03-22 ENCOUNTER — Inpatient Hospital Stay (HOSPITAL_COMMUNITY)
Admission: RE | Admit: 2021-03-22 | Discharge: 2021-03-31 | DRG: 560 | Disposition: A | Discharge status: Home Health | Payer: Medicare Other | Source: Other Acute Inpatient Hospital | Attending: Physical Medicine & Rehabilitation | Admitting: Physical Medicine & Rehabilitation

## 2021-03-22 DIAGNOSIS — Z8546 Personal history of malignant neoplasm of prostate: Secondary | ICD-10-CM | POA: Diagnosis not present

## 2021-03-22 DIAGNOSIS — Z7902 Long term (current) use of antithrombotics/antiplatelets: Secondary | ICD-10-CM

## 2021-03-22 DIAGNOSIS — Z6829 Body mass index (BMI) 29.0-29.9, adult: Secondary | ICD-10-CM

## 2021-03-22 DIAGNOSIS — E119 Type 2 diabetes mellitus without complications: Secondary | ICD-10-CM

## 2021-03-22 DIAGNOSIS — G8918 Other acute postprocedural pain: Secondary | ICD-10-CM

## 2021-03-22 DIAGNOSIS — N1831 Chronic kidney disease, stage 3a: Secondary | ICD-10-CM | POA: Diagnosis present

## 2021-03-22 DIAGNOSIS — Z952 Presence of prosthetic heart valve: Secondary | ICD-10-CM | POA: Diagnosis not present

## 2021-03-22 DIAGNOSIS — E1165 Type 2 diabetes mellitus with hyperglycemia: Secondary | ICD-10-CM | POA: Diagnosis present

## 2021-03-22 DIAGNOSIS — I951 Orthostatic hypotension: Secondary | ICD-10-CM

## 2021-03-22 DIAGNOSIS — Z96642 Presence of left artificial hip joint: Secondary | ICD-10-CM | POA: Diagnosis present

## 2021-03-22 DIAGNOSIS — S72002A Fracture of unspecified part of neck of left femur, initial encounter for closed fracture: Secondary | ICD-10-CM | POA: Diagnosis not present

## 2021-03-22 DIAGNOSIS — Z9079 Acquired absence of other genital organ(s): Secondary | ICD-10-CM

## 2021-03-22 DIAGNOSIS — S80219D Abrasion, unspecified knee, subsequent encounter: Secondary | ICD-10-CM

## 2021-03-22 DIAGNOSIS — Z951 Presence of aortocoronary bypass graft: Secondary | ICD-10-CM

## 2021-03-22 DIAGNOSIS — I129 Hypertensive chronic kidney disease with stage 1 through stage 4 chronic kidney disease, or unspecified chronic kidney disease: Secondary | ICD-10-CM | POA: Diagnosis present

## 2021-03-22 DIAGNOSIS — M1990 Unspecified osteoarthritis, unspecified site: Secondary | ICD-10-CM | POA: Diagnosis present

## 2021-03-22 DIAGNOSIS — D62 Acute posthemorrhagic anemia: Secondary | ICD-10-CM | POA: Diagnosis present

## 2021-03-22 DIAGNOSIS — S72002D Fracture of unspecified part of neck of left femur, subsequent encounter for closed fracture with routine healing: Secondary | ICD-10-CM | POA: Diagnosis present

## 2021-03-22 DIAGNOSIS — Z87891 Personal history of nicotine dependence: Secondary | ICD-10-CM | POA: Diagnosis not present

## 2021-03-22 DIAGNOSIS — E669 Obesity, unspecified: Secondary | ICD-10-CM | POA: Diagnosis present

## 2021-03-22 DIAGNOSIS — E1151 Type 2 diabetes mellitus with diabetic peripheral angiopathy without gangrene: Secondary | ICD-10-CM | POA: Diagnosis present

## 2021-03-22 DIAGNOSIS — E871 Hypo-osmolality and hyponatremia: Secondary | ICD-10-CM

## 2021-03-22 DIAGNOSIS — R059 Cough, unspecified: Secondary | ICD-10-CM

## 2021-03-22 DIAGNOSIS — W19XXXD Unspecified fall, subsequent encounter: Secondary | ICD-10-CM | POA: Diagnosis present

## 2021-03-22 DIAGNOSIS — F05 Delirium due to known physiological condition: Secondary | ICD-10-CM | POA: Diagnosis present

## 2021-03-22 DIAGNOSIS — E1122 Type 2 diabetes mellitus with diabetic chronic kidney disease: Secondary | ICD-10-CM | POA: Diagnosis present

## 2021-03-22 DIAGNOSIS — I2510 Atherosclerotic heart disease of native coronary artery without angina pectoris: Secondary | ICD-10-CM | POA: Diagnosis present

## 2021-03-22 DIAGNOSIS — I10 Essential (primary) hypertension: Secondary | ICD-10-CM

## 2021-03-22 DIAGNOSIS — K219 Gastro-esophageal reflux disease without esophagitis: Secondary | ICD-10-CM | POA: Diagnosis present

## 2021-03-22 DIAGNOSIS — Z91013 Allergy to seafood: Secondary | ICD-10-CM

## 2021-03-22 DIAGNOSIS — Z7984 Long term (current) use of oral hypoglycemic drugs: Secondary | ICD-10-CM

## 2021-03-22 DIAGNOSIS — Z79899 Other long term (current) drug therapy: Secondary | ICD-10-CM

## 2021-03-22 DIAGNOSIS — Z7982 Long term (current) use of aspirin: Secondary | ICD-10-CM

## 2021-03-22 DIAGNOSIS — M7989 Other specified soft tissue disorders: Secondary | ICD-10-CM

## 2021-03-22 LAB — GLUCOSE, CAPILLARY
Glucose-Capillary: 225 mg/dL — ABNORMAL HIGH (ref 70–99)
Glucose-Capillary: 342 mg/dL — ABNORMAL HIGH (ref 70–99)
Glucose-Capillary: 212 mg/dL — ABNORMAL HIGH (ref 70–99)
Glucose-Capillary: 247 mg/dL — ABNORMAL HIGH (ref 70–99)

## 2021-03-22 LAB — MAGNESIUM: Magnesium: 1.8 mg/dL (ref 1.7–2.4)

## 2021-03-22 LAB — CBC
HCT: 24.6 % — ABNORMAL LOW (ref 39.0–52.0)
Hemoglobin: 8 g/dL — ABNORMAL LOW (ref 13.0–17.0)
MCH: 30.3 pg (ref 26.0–34.0)
MCHC: 32.5 g/dL (ref 30.0–36.0)
MCV: 93.2 fL (ref 80.0–100.0)
Platelets: 143 10*3/uL — ABNORMAL LOW (ref 150–400)
RBC: 2.64 MIL/uL — ABNORMAL LOW (ref 4.22–5.81)
RDW: 14.6 % (ref 11.5–15.5)
WBC: 9.1 10*3/uL (ref 4.0–10.5)
nRBC: 0 % (ref 0.0–0.2)

## 2021-03-22 LAB — BASIC METABOLIC PANEL
Anion gap: 6 (ref 5–15)
BUN: 24 mg/dL — ABNORMAL HIGH (ref 8–23)
CO2: 26 mmol/L (ref 22–32)
Calcium: 8.6 mg/dL — ABNORMAL LOW (ref 8.9–10.3)
Chloride: 101 mmol/L (ref 98–111)
Creatinine, Ser: 1.03 mg/dL (ref 0.61–1.24)
GFR, Estimated: >60 mL/min (ref >60)
Glucose, Bld: 233 mg/dL — ABNORMAL HIGH (ref 70–99)
Potassium: 4.2 mmol/L (ref 3.5–5.1)
Sodium: 133 mmol/L — ABNORMAL LOW (ref 135–145)

## 2021-03-22 LAB — PHOSPHORUS: Phosphorus: 3.9 mg/dL (ref 2.5–4.6)

## 2021-03-22 MED ORDER — ASPIRIN 81 MG PO CHEW
81.0000 mg | CHEWABLE_TABLET | Freq: Two times a day (BID) | ORAL | Status: DC
  Administered 2021-03-22 – 2021-03-31 (×18): 81 mg via ORAL
  Filled 2021-03-22 (×18): qty 1

## 2021-03-22 MED ORDER — ROSUVASTATIN CALCIUM 5 MG PO TABS
10.0000 mg | ORAL_TABLET | Freq: Every day | ORAL | Status: DC
  Administered 2021-03-22 – 2021-03-30 (×9): 10 mg via ORAL
  Filled 2021-03-22 (×10): qty 2

## 2021-03-22 MED ORDER — INSULIN GLARGINE 100 UNIT/ML ~~LOC~~ SOLN
8.0000 [IU] | Freq: Every day | SUBCUTANEOUS | Status: DC
  Administered 2021-03-23 – 2021-03-31 (×9): 8 [IU] via SUBCUTANEOUS
  Filled 2021-03-22 (×9): qty 0.08

## 2021-03-22 MED ORDER — ASPIRIN 81 MG PO CHEW: 81.0000 mg | CHEWABLE_TABLET | Freq: Two times a day (BID) | ORAL | 0 refills | Status: AC

## 2021-03-22 MED ORDER — ONDANSETRON HCL 4 MG/2ML IJ SOLN: 4.0000 mg | Freq: Four times a day (QID) | INTRAMUSCULAR | Status: DC | PRN

## 2021-03-22 MED ORDER — HYDROCODONE-ACETAMINOPHEN 5-325 MG PO TABS
1.0000 | ORAL_TABLET | ORAL | Status: DC | PRN
  Administered 2021-03-22: 1 via ORAL
  Administered 2021-03-23 (×2): 2 via ORAL
  Administered 2021-03-24: 1 via ORAL
  Administered 2021-03-24 – 2021-03-25 (×3): 2 via ORAL
  Administered 2021-03-25: 1 via ORAL
  Administered 2021-03-25 – 2021-03-26 (×2): 2 via ORAL
  Administered 2021-03-26 (×2): 1 via ORAL
  Administered 2021-03-26: 2 via ORAL
  Administered 2021-03-27 – 2021-03-30 (×9): 1 via ORAL
  Administered 2021-03-30 (×2): 2 via ORAL
  Administered 2021-03-31: 1 via ORAL
  Administered 2021-03-31: 2 via ORAL
  Filled 2021-03-22 (×2): qty 1
  Filled 2021-03-22 (×3): qty 2
  Filled 2021-03-22: qty 1
  Filled 2021-03-22: qty 2
  Filled 2021-03-22 (×2): qty 1
  Filled 2021-03-22 (×2): qty 2
  Filled 2021-03-22 (×2): qty 1
  Filled 2021-03-22: qty 2
  Filled 2021-03-22 (×3): qty 1
  Filled 2021-03-22 (×2): qty 2
  Filled 2021-03-22: qty 1
  Filled 2021-03-22: qty 2
  Filled 2021-03-22: qty 1
  Filled 2021-03-22 (×4): qty 2

## 2021-03-22 MED ORDER — LIDOCAINE 5 % EX PTCH
2.0000 | MEDICATED_PATCH | CUTANEOUS | Status: DC
  Administered 2021-03-22 – 2021-03-30 (×9): 2 via TRANSDERMAL
  Filled 2021-03-22 (×9): qty 2

## 2021-03-22 MED ORDER — SENNA 8.6 MG PO TABS
1.0000 | ORAL_TABLET | Freq: Every day | ORAL | Status: DC
  Administered 2021-03-22 – 2021-03-30 (×9): 8.6 mg via ORAL
  Filled 2021-03-22 (×9): qty 1

## 2021-03-22 MED ORDER — AMLODIPINE BESYLATE 5 MG PO TABS
5.0000 mg | ORAL_TABLET | Freq: Every day | ORAL | Status: DC
  Administered 2021-03-23 – 2021-03-31 (×9): 5 mg via ORAL
  Filled 2021-03-22 (×9): qty 1

## 2021-03-22 MED ORDER — ONDANSETRON HCL 4 MG PO TABS
4.0000 mg | ORAL_TABLET | Freq: Four times a day (QID) | ORAL | Status: DC | PRN
Start: 2021-03-22 — End: 2021-03-31
  Administered 2021-03-28: 4 mg via ORAL
  Filled 2021-03-22 (×3): qty 1

## 2021-03-22 MED ORDER — METOPROLOL SUCCINATE ER 25 MG PO TB24
25.0000 mg | ORAL_TABLET | Freq: Every day | ORAL | Status: DC
  Administered 2021-03-23 – 2021-03-31 (×9): 25 mg via ORAL
  Filled 2021-03-22 (×9): qty 1

## 2021-03-22 MED ORDER — DOCUSATE SODIUM 100 MG PO CAPS
100.0000 mg | ORAL_CAPSULE | Freq: Two times a day (BID) | ORAL | Status: DC
  Administered 2021-03-22 – 2021-03-31 (×18): 100 mg via ORAL
  Filled 2021-03-22 (×18): qty 1

## 2021-03-22 MED ORDER — INSULIN ASPART 100 UNIT/ML IJ SOLN
0.0000 [IU] | Freq: Three times a day (TID) | INTRAMUSCULAR | Status: DC
  Administered 2021-03-22 – 2021-03-23 (×2): 5 [IU] via SUBCUTANEOUS
  Administered 2021-03-23: 8 [IU] via SUBCUTANEOUS
  Administered 2021-03-23: 3 [IU] via SUBCUTANEOUS
  Administered 2021-03-24 (×2): 5 [IU] via SUBCUTANEOUS
  Administered 2021-03-24: 3 [IU] via SUBCUTANEOUS
  Administered 2021-03-25: 2 [IU] via SUBCUTANEOUS
  Administered 2021-03-25 – 2021-03-26 (×3): 3 [IU] via SUBCUTANEOUS
  Administered 2021-03-26: 2 [IU] via SUBCUTANEOUS
  Administered 2021-03-26: 3 [IU] via SUBCUTANEOUS
  Administered 2021-03-27: 5 [IU] via SUBCUTANEOUS
  Administered 2021-03-27 (×2): 2 [IU] via SUBCUTANEOUS
  Administered 2021-03-28: 3 [IU] via SUBCUTANEOUS
  Administered 2021-03-28: 2 [IU] via SUBCUTANEOUS
  Administered 2021-03-29: 3 [IU] via SUBCUTANEOUS
  Administered 2021-03-29 – 2021-03-30 (×2): 2 [IU] via SUBCUTANEOUS
  Administered 2021-03-30: 3 [IU] via SUBCUTANEOUS

## 2021-03-22 MED ORDER — METFORMIN HCL 500 MG PO TABS
500.0000 mg | ORAL_TABLET | Freq: Two times a day (BID) | ORAL | Status: DC
  Administered 2021-03-22 – 2021-03-25 (×7): 500 mg via ORAL
  Filled 2021-03-22 (×8): qty 1

## 2021-03-22 MED ORDER — FAMOTIDINE 20 MG PO TABS
20.0000 mg | ORAL_TABLET | Freq: Two times a day (BID) | ORAL | Status: DC
  Administered 2021-03-22 – 2021-03-31 (×18): 20 mg via ORAL
  Filled 2021-03-22 (×18): qty 1

## 2021-03-22 MED ORDER — ACETAMINOPHEN 325 MG PO TABS
325.0000 mg | ORAL_TABLET | Freq: Four times a day (QID) | ORAL | Status: DC | PRN
  Administered 2021-03-22 – 2021-03-27 (×4): 650 mg via ORAL
  Filled 2021-03-22 (×5): qty 2

## 2021-03-22 MED ORDER — METHOCARBAMOL 500 MG PO TABS
500.0000 mg | ORAL_TABLET | Freq: Four times a day (QID) | ORAL | Status: DC | PRN
  Administered 2021-03-22 – 2021-03-29 (×11): 500 mg via ORAL
  Filled 2021-03-22 (×12): qty 1

## 2021-03-22 MED ORDER — CLOPIDOGREL BISULFATE 75 MG PO TABS
75.0000 mg | ORAL_TABLET | Freq: Every day | ORAL | Status: DC
  Administered 2021-03-22 – 2021-03-31 (×10): 75 mg via ORAL
  Filled 2021-03-22 (×10): qty 1

## 2021-03-22 MED ORDER — INSULIN GLARGINE 100 UNIT/ML ~~LOC~~ SOLN
8.0000 [IU] | Freq: Every day | SUBCUTANEOUS | Status: DC
  Administered 2021-03-22: 8 [IU] via SUBCUTANEOUS
  Filled 2021-03-22: qty 0.08

## 2021-03-22 MED ORDER — HYDROCODONE-ACETAMINOPHEN 5-325 MG PO TABS: 1.0000 | ORAL_TABLET | ORAL | 0 refills | Status: DC | PRN

## 2021-03-22 NOTE — Progress Notes (Signed)
Inpatient Rehabilitation  Patient information reviewed and entered into eRehab system by Arno Cullers M. Jammal Sarr, M.A., CCC/SLP, PPS Coordinator.  Information including medical coding, functional ability and quality indicators will be reviewed and updated through discharge.    

## 2021-03-22 NOTE — Plan of Care (Signed)
  Problem: Activity: Goal: Risk for activity intolerance will decrease Outcome: Progressing   Problem: Pain Managment: Goal: General experience of comfort will improve Outcome: Progressing   Problem: Safety: Goal: Ability to remain free from injury will improve Outcome: Progressing   

## 2021-03-22 NOTE — Progress Notes (Signed)
Notified MD via secure chat, CIR bed is available and transportation is setup by 1300. DC order is needed.  Also notified SW await for dc order from MD.

## 2021-03-22 NOTE — Progress Notes (Signed)
Pt arrived via EMS at 1350, pt alert, able to make needs known, oriented to rehab. No c/o pain

## 2021-03-22 NOTE — Progress Notes (Signed)
Patient ID: Dominic Mendoza, male   DOB: 1951-07-19, 70 y.o.   MRN: [id number] Met with the patient and wife to review role of the nurse CM and initiate education. Discussed WBAT left LE and pre-existing medical issues including HTN, HLD, DM (A1c 8.1) and on ASA; Plavix restart approved per MD. Continue to follow along to discharge to address educational needs. Handouts given on carb counting, CKD dietary modifications, DASH diet . Collaborate with the SW to facilitate preparation for discharge. Margarito Liner

## 2021-03-22 NOTE — Progress Notes (Signed)
Inpatient Rehab Admissions Coordinator:  There is a bed available in CIR for pt to admit today. Dr. Nevada Crane aware and in agreement. NSG, TOC, pt/family also aware.  Transport scheduled for 1pm. Pt to be admitted to 2NO03.   Gayland Curry, [place], [place] Admissions Coordinator [phone]

## 2021-03-22 NOTE — Progress Notes (Signed)
Inpatient Rehabilitation Medication Review by a Pharmacist  A complete drug regimen review was completed for this patient to identify any potential clinically significant medication issues.  Clinically significant medication issues were identified:  no  Check AMION for pharmacist assigned to patient if future medication questions/issues arise during this admission.  Pharmacist comments:   Time spent performing this drug regimen review (minutes):  20   Rory Xiang 03/22/2021 5:53 PM

## 2021-03-22 NOTE — IPOC Note (Signed)
Individualized overall Plan of Care (IPOC) Patient Details Name: Dominic Mendoza MRN: [id number] DOB: Mar 06, 1951  Admitting Diagnosis: Left displaced femoral neck fracture [facility])  Hospital Problems: Principal Problem:   Left displaced femoral neck fracture ([place]) Active Problems:   Hyponatremia   Uncontrolled type 2 diabetes mellitus with hyperglycemia (HCC)   Essential hypertension   Acute blood loss anemia   Postoperative pain   Orthostatic hypotension     Functional Problem List: Nursing Pain,Bladder,Bowel,Safety,Edema,Sensory,Endurance,Skin Integrity,Medication Management  PT Balance,Pain,Safety,Endurance,Motor  OT Balance,Endurance,Motor,Pain  SLP    TR         Basic ADL's: OT Grooming,Bathing,Dressing,Toileting     Advanced  ADL's: OT Simple Meal Preparation,Light Housekeeping     Transfers: PT Bed Mobility,Bed to [employer]  OT Toilet,Tub/Shower     Locomotion: PT Ambulation,Stairs     Additional Impairments: OT None  SLP        TR      Anticipated Outcomes Item Anticipated Outcome  Self Feeding    Swallowing      Basic self-care  close spvsn  Toileting  close spvsn   Bathroom Transfers close spvsn  Bowel/Bladder  To be continent x 2, LBM 05/23  Transfers  S  Locomotion  S w/ RW  Communication     Cognition     Pain  pain at or below level 4  Safety/Judgment  maintain safety with cues/reminders   Therapy Plan: PT Intensity: Minimum of 1-2 x/day ,45 to 90 minutes PT Frequency: 5 out of 7 days PT Duration Estimated Length of Stay: 5-7 days OT Intensity: Minimum of 1-2 x/day, 45 to 90 minutes OT Frequency: 5 out of 7 days OT Duration/Estimated Length of Stay: 5-7 days      Team Interventions: Nursing Interventions Patient/Family Education,Bowel Management,Pain Management,Skin Care/Wound Management,Medication Management,Disease Management/Prevention,Bladder [employer]  PT interventions Ambulation/gait  training,Discharge planning,DME/adaptive equipment instruction,Functional mobility training,Pain management,Therapeutic Activities,UE/LE Strength taining/ROM,Wheelchair propulsion/positioning,UE/LE Coordination activities,Therapeutic Exercise,Stair training,Patient/family education,Neuromuscular re-education,Disease Education officer, community  OT Interventions Balance/vestibular training,Community reintegration,Discharge planning,Pain management,UE/LE Strength taining/ROM,Functional mobility training,Self Care/advanced ADL retraining,Therapeutic Exercise,Therapeutic Activities,Patient/family education,DME/adaptive equipment instruction,Psychosocial support  SLP Interventions    TR Interventions    SW/CM Interventions Discharge Planning,Psychosocial Support,Patient/Family Education   Barriers to Discharge MD  Medical stability, Wound care and Weight  Nursing      PT      OT Home environment access/layout Steps within 1 level  SLP      SW       Team Discharge Planning: Destination: PT-Home ,OT- Home , SLP-  Projected Follow-up: PT-Home health PT, OT-  Home health OT, SLP-  Projected Equipment Needs: PT- , OT- 3 in 1 bedside comode,Tub/shower bench,To be determined, SLP-  Equipment Details: PT-has RW may need shower bench, OT-Pt owns hospital bed Patient/family involved in discharge planning: PT- Patient,  OT-Patient, SLP-   MD ELOS: 5-7 days. Medical Rehab Prognosis:  Excellent Assessment: 70 year old right-handed male with history of prostate cancerwith laparoscopic radical prostatectomy 2014,,PAD status post left SFA and stent 2016,obesity with BMI 29.92,diabetes mellitus, hypertension, CAD with [id number] and aortic valve replacement September 2021 maintained on aspirin and Plavix, tobacco use, CKD stage III. Independent prior to admission. 1 level home 3 steps to entry. Presented 03/18/2021 after mechanical fall. No loss of conscious.  Denied any chest pain or shortness of breath associated with the fall. X-rays and imaging revealed displaced left femoral neck fracture. Underwent left total hip arthroplasty anterior approach 03/18/2021 per Dr. [name].. Weightbearing as tolerated left lower extremity. Hospital course complicated  by acute blood loss anemia. Patient with resulting functional deficits with mobility, transfers, endurance, self-care.  Will set goals for Supervision with PT/OT.    Due to the current state of emergency, patients may not be receiving their 3-hours of Medicare-mandated therapy.  See Team Conference Notes for weekly updates to the plan of care

## 2021-03-22 NOTE — Progress Notes (Signed)
Patient discharge to [facility] impatient rehab via [place].

## 2021-03-22 NOTE — TOC Transition Note (Signed)
Transition of Care [facility]) - CM/SW Discharge Note  Patient Details  Name: Dominic Mendoza MRN: [id number] Date of Birth: September 30, 1951  Transition of Care [facility]) CM/SW Contact:  Sherie Don, LCSW Phone Number: 03/22/2021, 10:21 AM  Clinical Narrative: Patient to be admitted to CIR today. CSW updated RN. TOC signing off.    Final next level of care: IP Rehab Facility Barriers to Discharge: Barriers Resolved  Patient Goals and CMS Choice Patient states their goals for this hospitalization and ongoing recovery are:: Be able to return home CMS Medicare.gov Compare Post Acute Care list provided to:: Patient Choice offered to / list presented to : Patient  Discharge Plan and Services In-house Referral: Clinical Social Work       DME Arranged: N/A DME Agency: NA  Readmission Risk Interventions No flowsheet data found.

## 2021-03-22 NOTE — H&P (Signed)
Physical Medicine and Rehabilitation Admission H&P        Chief Complaint  Patient presents with  . Fall  : HPI: Dominic Mendoza is a 70 year old right-handed male with history of prostate cancer with laparoscopic radical prostatectomy 2014,, PAD status post left SFA and stent 2016, obesity with BMI 29.92, diabetes mellitus, hypertension, CAD with CABG 2014 and aortic valve replacement September 2021 maintained on aspirin and Plavix, tobacco use, CKD stage III.  Per chart review lives with spouse.  Independent prior to admission.  1 level home 3 steps to entry.  Presented 03/18/2021 after mechanical fall.  No loss of conscious.  Denied any chest pain or shortness of breath associated with the fall.  X-rays and imaging revealed displaced left femoral neck fracture.  Underwent left total hip arthroplasty anterior approach 03/18/2021 per Dr. Linna Caprice..  Weightbearing as tolerated left lower extremity.  Hospital course acute blood loss anemia 8.2.  Currently maintained on aspirin for CVA prophylaxis.  Therapy evaluations completed due to patient decreased functional mobility was admitted for a comprehensive rehab program.   Review of Systems  Constitutional: Negative for chills and fever.  HENT: Negative for hearing loss.   Eyes: Negative for blurred vision and double vision.  Respiratory: Positive for cough. Negative for shortness of breath.   Cardiovascular: Positive for leg swelling. Negative for chest pain and palpitations.  Gastrointestinal: Positive for constipation. Negative for heartburn, nausea and vomiting.       GERD  Genitourinary: Positive for urgency. Negative for dysuria and flank pain.  Musculoskeletal: Positive for joint pain and myalgias.  Skin: Negative for rash.  Neurological:       Wife reports unsteady gait  All other systems reviewed and are negative.   Past Medical History:  Diagnosis Date  . Arthritis    . Cancer [facility])      prostate  . Diabetes mellitus without  complication (HCC)    . Dyspnea    . GERD (gastroesophageal reflux disease)    . Hypertension    . Smokers' cough [facility])           Past Surgical History:  Procedure Laterality Date  . COLONOSCOPY      . CORONARY ARTERY BYPASS GRAFT        triple  . LYMPHADENECTOMY Bilateral 02/25/2013    Procedure: LYMPHADENECTOMY;  Surgeon: Crecencio Mc, MD;  Location: [facility];  Service: Urology;  Laterality: Bilateral;  Bilateral Pelvic   . ROBOT ASSISTED LAPAROSCOPIC RADICAL PROSTATECTOMY N/A 02/25/2013    Procedure: ROBOTIC ASSISTED LAPAROSCOPIC RADICAL PROSTATECTOMY LEVEL 2;  Surgeon: Crecencio Mc, MD;  Location: [facility];  Service: Urology;  Laterality: N/A;  . stent left leg      . TOTAL HIP ARTHROPLASTY Left 03/18/2021    Procedure: TOTAL HIP ARTHROPLASTY ANTERIOR APPROACH;  Surgeon: Samson Frederic, MD;  Location: [facility];  Service: Orthopedics;  Laterality: Left;    History reviewed. No pertinent family history. Social History:  reports that he has quit smoking. His smoking use included cigarettes. He has a 62.00 pack-year smoking history. He has never used smokeless tobacco. He reports that he does not drink alcohol and does not use drugs. Allergies:  Allergies  Allergen Reactions  . Shrimp [Shellfish Allergy] Rash          Medications Prior to Admission  Medication Sig Dispense Refill  . aspirin 81 MG chewable tablet Chew 81 mg by mouth daily.      . benazepril (LOTENSIN) 10  MG tablet Take 10 mg by mouth daily.      . clopidogrel (PLAVIX) 75 MG tablet Take 75 mg by mouth daily.      . famotidine (PEPCID) 20 MG tablet Take 20 mg by mouth 2 (two) times daily.      [name] glipiZIDE (GLUCOTROL) 5 MG tablet Take 5 mg by mouth 2 (two) times daily.      . hydrochlorothiazide (HYDRODIURIL) 25 MG tablet Take 25 mg by mouth every morning.      . metFORMIN (GLUCOPHAGE-XR) 500 MG 24 hr tablet Take 1,000 mg by mouth 2 (two) times daily.      . metoprolol succinate (TOPROL-XL) 25 MG 24 hr tablet Take 25 mg by mouth  daily.      . pioglitazone (ACTOS) 15 MG tablet Take 15 mg by mouth daily.      . rosuvastatin (CRESTOR) 10 MG tablet Take 10 mg by mouth at bedtime.          Drug Regimen Review Drug regimen was reviewed and remains appropriate with no significant issues identified   Home: Home Living Family/patient expects to be discharged to:: Unsure Living Arrangements: Spouse/significant other Available Help at Discharge: Family,Available 24 hours/day Type of Home: House Home Access: Stairs to enter [employer] of Steps: 1 in garage, 5 in front of house Entrance Stairs-Rails: None (rails by stairs in front of house) Home Layout: One level Bathroom Shower/Tub: Chiropodist: Standard Bathroom Accessibility: Yes Home Equipment: Environmental consultant - 2 wheels  Lives With: Spouse   Functional History: Prior Function Level of Independence: Independent Comments: Did not use assistive device but spouse states has been unsteady   Functional Status:  Mobility: Bed Mobility Overal bed mobility: Needs Assistance Bed Mobility: Supine to Sit Supine to sit: Min assist,Mod assist General bed mobility comments: Increased time with cues for sequence and assist to manage L LE as well as to bring trunk to upright Transfers Overall transfer level: Needs assistance Equipment used: Rolling walker (2 wheeled) Transfers: Sit to/from Stand Sit to Stand: Min assist General transfer comment: cues for LE management and use of UEs to self assist.  Physical assist to bring wt up and fwd and to balance in standing Ambulation/Gait Ambulation/Gait assistance: Min assist,Min guard Gait Distance (Feet): 70 Feet Assistive device: Rolling walker (2 wheeled) Gait Pattern/deviations: Step-to pattern,Decreased step length - right,Decreased step length - left,Shuffle,Trunk flexed General Gait Details: Increased time with cues for sequence, posture and position from RW. Gait velocity: decr   ADL:    Cognition: Cognition Overall Cognitive Status: Within Functional Limits for tasks assessed Orientation Level: Oriented to person,Oriented to place,Oriented to time,Disoriented to situation Cognition Arousal/Alertness: Awake/alert Behavior During Therapy: WFL for tasks assessed/performed Overall Cognitive Status: Within Functional Limits for tasks assessed   Physical Exam: Blood pressure (!) 144/57, pulse (!) 58, temperature 97.7 F (36.5 C), temperature source Oral, resp. rate 16, height 5\' 9"  (1.753 m), weight 91.9 kg, SpO2 95 %. Physical Exam Constitutional:      Appearance: Normal appearance.  HENT:     Head: Normocephalic and atraumatic.     Nose: Nose normal.     Mouth/Throat:     Mouth: Mucous membranes are moist.     Pharynx: Oropharynx is clear.     Comments: Missing teeth Eyes:     Extraocular Movements: Extraocular movements intact.     Pupils: Pupils are equal, round, and reactive to light.  Cardiovascular:     Rate and Rhythm: Normal rate  and regular rhythm.     Heart sounds: No murmur heard. No gallop.   Pulmonary:     Effort: Pulmonary effort is normal. No respiratory distress.     Breath sounds: Normal breath sounds. No wheezing.  Abdominal:     General: There is no distension.     Palpations: Abdomen is soft.     Tenderness: There is no abdominal tenderness.  Musculoskeletal:        General: Tenderness present.     Cervical back: Normal range of motion.     Left lower leg: Edema present.     Comments: Left hip, left chest wall  Skin:    General: Skin is warm.     Comments: Left hip incision is dressed with immediate post-op dressing. Foam dressing over quarter sized abrasions left elbow and knee.    Neurological:     Mental Status: He is alert.     Comments: Alert and oriented x 3. Normal insight and awareness. Intact Memory. Normal language and speech. Cranial nerve exam unremarkable. UE motor 5/5. LLE limited by pain and 2-3/5 prox to 4/5 distally in  supine. RLE 4-5/5. No sensory findings  Psychiatric:        Mood and Affect: Mood normal.        Behavior: Behavior normal.        Thought Content: Thought content normal.        Lab Results Last 48 Hours        Results for orders placed or performed during the hospital encounter of 03/17/21 (from the past 48 hour(s))  Glucose, capillary     Status: Abnormal    Collection Time: 03/20/21  7:37 AM  Result Value Ref Range    Glucose-Capillary 179 (H) 70 - 99 mg/dL      Comment: Glucose reference range applies only to samples taken after fasting for at least 8 hours.  Glucose, capillary     Status: Abnormal    Collection Time: 03/20/21 12:04 PM  Result Value Ref Range    Glucose-Capillary 270 (H) 70 - 99 mg/dL      Comment: Glucose reference range applies only to samples taken after fasting for at least 8 hours.  Glucose, capillary     Status: Abnormal    Collection Time: 03/20/21  4:48 PM  Result Value Ref Range    Glucose-Capillary 212 (H) 70 - 99 mg/dL      Comment: Glucose reference range applies only to samples taken after fasting for at least 8 hours.  Glucose, capillary     Status: Abnormal    Collection Time: 03/20/21  8:11 PM  Result Value Ref Range    Glucose-Capillary 241 (H) 70 - 99 mg/dL      Comment: Glucose reference range applies only to samples taken after fasting for at least 8 hours.  CBC     Status: Abnormal    Collection Time: 03/21/21  3:08 AM  Result Value Ref Range    WBC 10.5 4.0 - 10.5 K/uL    RBC 2.94 (L) 4.22 - 5.81 MIL/uL    Hemoglobin 8.8 (L) 13.0 - 17.0 g/dL    HCT 27.4 (L) 39.0 - 52.0 %    MCV 93.2 80.0 - 100.0 fL    MCH 29.9 26.0 - 34.0 pg    MCHC 32.1 30.0 - 36.0 g/dL    RDW 14.8 11.5 - 15.5 %    Platelets 140 (L) 150 - 400 K/uL    nRBC 0.0  0.0 - 0.2 %      Comment: Performed at [facility], [place] [address], [place], [place] [address]  Glucose, capillary     Status: Abnormal    Collection Time: 03/21/21  7:26 AM  Result  Value Ref Range    Glucose-Capillary 189 (H) 70 - 99 mg/dL      Comment: Glucose reference range applies only to samples taken after fasting for at least 8 hours.  Basic metabolic panel     Status: Abnormal    Collection Time: 03/21/21  7:45 AM  Result Value Ref Range    Sodium 133 (L) 135 - 145 mmol/L    Potassium 4.4 3.5 - 5.1 mmol/L    Chloride 101 98 - 111 mmol/L    CO2 25 22 - 32 mmol/L    Glucose, Bld 223 (H) 70 - 99 mg/dL      Comment: Glucose reference range applies only to samples taken after fasting for at least 8 hours.    BUN 26 (H) 8 - 23 mg/dL    Creatinine, Ser 1.00 0.61 - 1.24 mg/dL    Calcium 8.7 (L) 8.9 - 10.3 mg/dL    GFR, Estimated >60 >60 mL/min      Comment: (NOTE) Calculated using the CKD-EPI Creatinine Equation (2021)      Anion gap 7 5 - 15      Comment: Performed at [facility], [place] [address] [place], [place] [address]  Magnesium     Status: None    Collection Time: 03/21/21  7:45 AM  Result Value Ref Range    Magnesium 1.8 1.7 - 2.4 mg/dL      Comment: Performed at [facility], [place] [address], [place], [place] [address]  Glucose, capillary     Status: Abnormal    Collection Time: 03/21/21 11:40 AM  Result Value Ref Range    Glucose-Capillary 236 (H) 70 - 99 mg/dL      Comment: Glucose reference range applies only to samples taken after fasting for at least 8 hours.  Hemoglobin and hematocrit, blood     Status: Abnormal    Collection Time: 03/21/21  2:12 PM  Result Value Ref Range    Hemoglobin 8.2 (L) 13.0 - 17.0 g/dL    HCT 25.7 (L) 39.0 - 52.0 %      Comment: Performed at [facility], [place] [address], [place], [place] [address]  Glucose, capillary     Status: Abnormal    Collection Time: 03/21/21  4:08 PM  Result Value Ref Range    Glucose-Capillary 221 (H) 70 - 99 mg/dL      Comment: Glucose reference range applies only to samples taken after fasting for at least 8 hours.  Glucose,  capillary     Status: Abnormal    Collection Time: 03/21/21  8:57 PM  Result Value Ref Range    Glucose-Capillary 316 (H) 70 - 99 mg/dL      Comment: Glucose reference range applies only to samples taken after fasting for at least 8 hours.      Imaging Results (Last 48 hours)  No results found.           Medical Problem List and Plan: 1.  Debility secondary to displaced left femoral neck fracture.  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 [name].  Weightbearing as tolerated             -patient may shower             -  ELOS/Goals: 7-10 days, mod I goals with PT and OT 2.  Antithrombotics: -DVT/anticoagulation: SCDs             -antiplatelet therapy: Aspirin 81 mg twice daily for DVT prophylaxis x3 weeks then aspirin 81 mg daily and Plavix 75 mg daily.               -Check vascular study 3. Pain Management: Hydrocodone/Robaxin as needed             -add lidocaine patch for left rib pain 4. Mood: Provide emotional support             -antipsychotic agents: N/A 5. Neuropsych: This patient is capable of making decisions on his own behalf. 6. Skin/Wound Care: continue post-op dressing for now, no visible drainage from wound             -foam dressings to protect left elbow and knee abrasions. 7. Fluids/Electrolytes/Nutrition: Routine in and outs with follow-up chemistries 8.  Acute blood loss anemia.  Follow-up CBC 9.  Hypertension.  Norvasc 5 mg daily, Toprol-XL 25 mg daily.  ACE inhibitors held due to uptrending creatinine.              - Monitor with increased mobility 10.  Diabetes mellitus.  Hemoglobin A1c 8.1.  Lantus insulin 8 units daily.  Patient on Glucotrol 5 mg twice daily and Glucophage 1000 mg twice daily as well as Actos 15 mg daily prior to admission.              -cbgs 225-342 today             -begin glucophage 500mg  bid ---titrate up as needed.  -add back glucotrol and actos as needed  -continue lantus 8u daily for now 11.   History of CAD/CABG/AVR.  Patient on aspirin 81 mg daily and Plavix 75 mg daily prior to admission.   12.  CKD stage III.  Admission creatinine 1.26 13.  PAD status post left SFA and stent 2016.  Patient has been cleared to resume Plavix     Cathlyn Parsons, PA-C 03/22/2021

## 2021-03-22 NOTE — Progress Notes (Signed)
Bilateral lower extremity venous duplex has been completed. Preliminary results can be found in CV Proc through chart review.   03/22/21 4:09 PM Dominic Mendoza RVT

## 2021-03-22 NOTE — H&P (Signed)
Physical Medicine and Rehabilitation Admission H&P    Chief Complaint  Patient presents with  . Fall  : HPI: Dominic Mendoza is a 70 year old right-handed male with history of prostate cancer with laparoscopic radical prostatectomy 2014,, PAD status post left SFA and stent 2016, obesity with BMI 29.92, diabetes mellitus, hypertension, CAD with CABG 2014 and aortic valve replacement September 2021 maintained on aspirin and Plavix, tobacco use, CKD stage III.  Per chart review lives with spouse.  Independent prior to admission.  1 level home 3 steps to entry.  Presented 03/18/2021 after mechanical fall.  No loss of conscious.  Denied any chest pain or shortness of breath associated with the fall.  X-rays and imaging revealed displaced left femoral neck fracture.  Underwent left total hip arthroplasty anterior approach 03/18/2021 per Dr. [name]..  Weightbearing as tolerated left lower extremity.  Hospital course acute blood loss anemia 8.2.  Currently maintained on aspirin for CVA prophylaxis.  Therapy evaluations completed due to patient decreased functional mobility was admitted for a comprehensive rehab program.  Review of Systems  Constitutional: Negative for chills and fever.  HENT: Negative for hearing loss.   Eyes: Negative for blurred vision and double vision.  Respiratory: Positive for cough. Negative for shortness of breath.   Cardiovascular: Positive for leg swelling. Negative for chest pain and palpitations.  Gastrointestinal: Positive for constipation. Negative for heartburn, nausea and vomiting.       GERD  Genitourinary: Positive for urgency. Negative for dysuria and flank pain.  Musculoskeletal: Positive for joint pain and myalgias.  Skin: Negative for rash.  Neurological:       Wife reports unsteady gait  All other systems reviewed and are negative.  Past Medical History:  Diagnosis Date  . Arthritis   . Cancer [facility])    prostate  . Diabetes mellitus without  complication ([place])   . Dyspnea   . GERD (gastroesophageal reflux disease)   . Hypertension   . Smokers' cough [facility])    Past Surgical History:  Procedure Laterality Date  . COLONOSCOPY    . CORONARY ARTERY BYPASS GRAFT     triple  . LYMPHADENECTOMY Bilateral 02/25/2013   Procedure: LYMPHADENECTOMY;  Surgeon: Dutch Gray, MD;  Location: [facility];  Service: Urology;  Laterality: Bilateral;  Bilateral Pelvic   . ROBOT ASSISTED LAPAROSCOPIC RADICAL PROSTATECTOMY N/A 02/25/2013   Procedure: ROBOTIC ASSISTED LAPAROSCOPIC RADICAL PROSTATECTOMY LEVEL 2;  Surgeon: Dutch Gray, MD;  Location: [facility];  Service: Urology;  Laterality: N/A;  . stent left leg    . TOTAL HIP ARTHROPLASTY Left 03/18/2021   Procedure: TOTAL HIP ARTHROPLASTY ANTERIOR APPROACH;  Surgeon: Rod Can, MD;  Location: [facility];  Service: Orthopedics;  Laterality: Left;   History reviewed. No pertinent family history. Social History:  reports that he has quit smoking. His smoking use included cigarettes. He has a 62.00 pack-year smoking history. He has never used smokeless tobacco. He reports that he does not drink alcohol and does not use drugs. Allergies:  Allergies  Allergen Reactions  . Shrimp [Shellfish Allergy] Rash   Medications Prior to Admission  Medication Sig Dispense Refill  . aspirin 81 MG chewable tablet Chew 81 mg by mouth daily.    . benazepril (LOTENSIN) 10 MG tablet Take 10 mg by mouth daily.    . clopidogrel (PLAVIX) 75 MG tablet Take 75 mg by mouth daily.    . famotidine (PEPCID) 20 MG tablet Take 20 mg by mouth 2 (two) times daily.    [name]  glipiZIDE (GLUCOTROL) 5 MG tablet Take 5 mg by mouth 2 (two) times daily.    . hydrochlorothiazide (HYDRODIURIL) 25 MG tablet Take 25 mg by mouth every morning.    . metFORMIN (GLUCOPHAGE-XR) 500 MG 24 hr tablet Take 1,000 mg by mouth 2 (two) times daily.    . metoprolol succinate (TOPROL-XL) 25 MG 24 hr tablet Take 25 mg by mouth daily.    . pioglitazone (ACTOS) 15 MG  tablet Take 15 mg by mouth daily.    . rosuvastatin (CRESTOR) 10 MG tablet Take 10 mg by mouth at bedtime.      Drug Regimen Review Drug regimen was reviewed and remains appropriate with no significant issues identified  Home: Home Living Family/patient expects to be discharged to:: Unsure Living Arrangements: Spouse/significant other Available Help at Discharge: Family,Available 24 hours/day Type of Home: House Home Access: Stairs to enter [employer] of Steps: 1 in garage, 5 in front of house Entrance Stairs-Rails: None (rails by stairs in front of house) Home Layout: One level Bathroom Shower/Tub: Chiropodist: Standard Bathroom Accessibility: Yes Home Equipment: Environmental consultant - 2 wheels  Lives With: Spouse   Functional History: Prior Function Level of Independence: Independent Comments: Did not use assistive device but spouse states has been unsteady  Functional Status:  Mobility: Bed Mobility Overal bed mobility: Needs Assistance Bed Mobility: Supine to Sit Supine to sit: Min assist,Mod assist General bed mobility comments: Increased time with cues for sequence and assist to manage L LE as well as to bring trunk to upright Transfers Overall transfer level: Needs assistance Equipment used: Rolling walker (2 wheeled) Transfers: Sit to/from Stand Sit to Stand: Min assist General transfer comment: cues for LE management and use of UEs to self assist.  Physical assist to bring wt up and fwd and to balance in standing Ambulation/Gait Ambulation/Gait assistance: Min assist,Min guard Gait Distance (Feet): 70 Feet Assistive device: Rolling walker (2 wheeled) Gait Pattern/deviations: Step-to pattern,Decreased step length - right,Decreased step length - left,Shuffle,Trunk flexed General Gait Details: Increased time with cues for sequence, posture and position from RW. Gait velocity: decr    ADL:    Cognition: Cognition Overall Cognitive Status:  Within Functional Limits for tasks assessed Orientation Level: Oriented to person,Oriented to place,Oriented to time,Disoriented to situation Cognition Arousal/Alertness: Awake/alert Behavior During Therapy: WFL for tasks assessed/performed Overall Cognitive Status: Within Functional Limits for tasks assessed  Physical Exam: Blood pressure (!) 144/57, pulse (!) 58, temperature 97.7 F (36.5 C), temperature source Oral, resp. rate 16, height 5\' 9"  (1.753 m), weight 91.9 kg, SpO2 95 %. Physical Exam Constitutional:      Appearance: Normal appearance.  HENT:     Head: Normocephalic and atraumatic.     Nose: Nose normal.     Mouth/Throat:     Mouth: Mucous membranes are moist.     Pharynx: Oropharynx is clear.     Comments: Missing teeth Eyes:     Extraocular Movements: Extraocular movements intact.     Pupils: Pupils are equal, round, and reactive to light.  Cardiovascular:     Rate and Rhythm: Normal rate and regular rhythm.     Heart sounds: No murmur heard. No gallop.   Pulmonary:     Effort: Pulmonary effort is normal. No respiratory distress.     Breath sounds: Normal breath sounds. No wheezing.  Abdominal:     General: There is no distension.     Palpations: Abdomen is soft.     Tenderness: There is  no abdominal tenderness.  Musculoskeletal:        General: Tenderness present.     Cervical back: Normal range of motion.     Left lower leg: Edema present.     Comments: Left hip, left chest wall  Skin:    General: Skin is warm.     Comments: Left hip incision is dressed with immediate post-op dressing. Foam dressing over quarter sized abrasions left elbow and knee.    Neurological:     Mental Status: He is alert.     Comments: Alert and oriented x 3. Normal insight and awareness. Intact Memory. Normal language and speech. Cranial nerve exam unremarkable. UE motor 5/5. LLE limited by pain and 2-3/5 prox to 4/5 distally in supine. RLE 4-5/5. No sensory findings   Psychiatric:        Mood and Affect: Mood normal.        Behavior: Behavior normal.        Thought Content: Thought content normal.     Results for orders placed or performed during the hospital encounter of 03/17/21 (from the past 48 hour(s))  Glucose, capillary     Status: Abnormal   Collection Time: 03/20/21  7:37 AM  Result Value Ref Range   Glucose-Capillary 179 (H) 70 - 99 mg/dL    Comment: Glucose reference range applies only to samples taken after fasting for at least 8 hours.  Glucose, capillary     Status: Abnormal   Collection Time: 03/20/21 12:04 PM  Result Value Ref Range   Glucose-Capillary 270 (H) 70 - 99 mg/dL    Comment: Glucose reference range applies only to samples taken after fasting for at least 8 hours.  Glucose, capillary     Status: Abnormal   Collection Time: 03/20/21  4:48 PM  Result Value Ref Range   Glucose-Capillary 212 (H) 70 - 99 mg/dL    Comment: Glucose reference range applies only to samples taken after fasting for at least 8 hours.  Glucose, capillary     Status: Abnormal   Collection Time: 03/20/21  8:11 PM  Result Value Ref Range   Glucose-Capillary 241 (H) 70 - 99 mg/dL    Comment: Glucose reference range applies only to samples taken after fasting for at least 8 hours.  CBC     Status: Abnormal   Collection Time: 03/21/21  3:08 AM  Result Value Ref Range   WBC 10.5 4.0 - 10.5 K/uL   RBC 2.94 (L) 4.22 - 5.81 MIL/uL   Hemoglobin 8.8 (L) 13.0 - 17.0 g/dL   HCT 27.4 (L) 39.0 - 52.0 %   MCV 93.2 80.0 - 100.0 fL   MCH 29.9 26.0 - 34.0 pg   MCHC 32.1 30.0 - 36.0 g/dL   RDW 14.8 11.5 - 15.5 %   Platelets 140 (L) 150 - 400 K/uL   nRBC 0.0 0.0 - 0.2 %    Comment: Performed at [facility], [place] [address] [place], [place] [address]  Glucose, capillary     Status: Abnormal   Collection Time: 03/21/21  7:26 AM  Result Value Ref Range   Glucose-Capillary 189 (H) 70 - 99 mg/dL    Comment: Glucose reference range applies only  to samples taken after fasting for at least 8 hours.  Basic metabolic panel     Status: Abnormal   Collection Time: 03/21/21  7:45 AM  Result Value Ref Range   Sodium 133 (L) 135 - 145 mmol/L   Potassium 4.4  3.5 - 5.1 mmol/L   Chloride 101 98 - 111 mmol/L   CO2 25 22 - 32 mmol/L   Glucose, Bld 223 (H) 70 - 99 mg/dL    Comment: Glucose reference range applies only to samples taken after fasting for at least 8 hours.   BUN 26 (H) 8 - 23 mg/dL   Creatinine, Ser 1.00 0.61 - 1.24 mg/dL   Calcium 8.7 (L) 8.9 - 10.3 mg/dL   GFR, Estimated >60 >60 mL/min    Comment: (NOTE) Calculated using the CKD-EPI Creatinine Equation (2021)    Anion gap 7 5 - 15    Comment: Performed at [facility], [place] [address], [place], [place] [address]  Magnesium     Status: None   Collection Time: 03/21/21  7:45 AM  Result Value Ref Range   Magnesium 1.8 1.7 - 2.4 mg/dL    Comment: Performed at [facility], [place] [address] [place], [place] [address]  Glucose, capillary     Status: Abnormal   Collection Time: 03/21/21 11:40 AM  Result Value Ref Range   Glucose-Capillary 236 (H) 70 - 99 mg/dL    Comment: Glucose reference range applies only to samples taken after fasting for at least 8 hours.  Hemoglobin and hematocrit, blood     Status: Abnormal   Collection Time: 03/21/21  2:12 PM  Result Value Ref Range   Hemoglobin 8.2 (L) 13.0 - 17.0 g/dL   HCT 25.7 (L) 39.0 - 52.0 %    Comment: Performed at [facility], [place] [address], [place], [place] [address]  Glucose, capillary     Status: Abnormal   Collection Time: 03/21/21  4:08 PM  Result Value Ref Range   Glucose-Capillary 221 (H) 70 - 99 mg/dL    Comment: Glucose reference range applies only to samples taken after fasting for at least 8 hours.  Glucose, capillary     Status: Abnormal   Collection Time: 03/21/21  8:57 PM  Result Value Ref Range   Glucose-Capillary 316 (H) 70 - 99 mg/dL    Comment:  Glucose reference range applies only to samples taken after fasting for at least 8 hours.   No results found.     Medical Problem List and Plan: 1.  Debility secondary to displaced left femoral neck fracture.  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 [name].  Weightbearing as tolerated  -patient may shower  -ELOS/Goals: 7-10 days, mod I goals with PT and OT 2.  Antithrombotics: -DVT/anticoagulation: SCDs  -antiplatelet therapy: Aspirin 81 mg twice daily for DVT prophylaxis x3 weeks then aspirin 81 mg daily and Plavix 75 mg daily.    -Check vascular study 3. Pain Management: Hydrocodone/Robaxin as needed  -add lidocaine patch for left rib pain 4. Mood: Provide emotional support  -antipsychotic agents: N/A 5. Neuropsych: This patient is capable of making decisions on his own behalf. 6. Skin/Wound Care: continue post-op dressing for now, no visible drainage from wound  -foam dressings to protect left elbow and knee abrasions. 7. Fluids/Electrolytes/Nutrition: Routine in and outs with follow-up chemistries 8.  Acute blood loss anemia.  Follow-up CBC 9.  Hypertension.  Norvasc 5 mg daily, Toprol-XL 25 mg daily.  ACE inhibitors held due to uptrending creatinine.   - Monitor with increased mobility 10.  Diabetes mellitus.  Hemoglobin A1c 8.1.  Lantus insulin 8 units daily.  Patient on Glucotrol 5 mg twice daily and Glucophage 1000 mg twice daily as well  as Actos 15 mg daily prior to admission.   -cbgs 225-342 today  -begin glucophage 500mg  bid  11.  History of CAD/CABG/AVR.  Patient on aspirin 81 mg daily and Plavix 75 mg daily prior to admission.   12.  CKD stage III.  Admission creatinine 1.26 13.  PAD status post left SFA and stent 2016.  Patient has been cleared to resume Plavix    Cathlyn Parsons, PA-C 03/22/2021

## 2021-03-22 NOTE — Progress Notes (Signed)
Report given to [facility] rehab nurse. DC package printed and will send with transportation.

## 2021-03-22 NOTE — Progress Notes (Signed)
Informed pt on bed availability and dc to CIR today.

## 2021-03-22 NOTE — Discharge Summary (Signed)
Discharge Summary  Dominic Mendoza ZDG:[id number] DOB: [birth year]  PCP: [name], MD  Admit date: 03/17/2021 Discharge date: 03/22/2021  Time spent: 35 minutes.  Recommendations for Outpatient Follow-up:  1. Transferred to inpatient rehab. 2. Follow-up with orthopedic surgery. 3. Follow-up with your cardiologist and vascular surgery. 4. Follow-up with your PCP 5. Take your medications as prescribed. 6. Continue PT OT with assistance and fall precaution  Discharge Diagnoses:  Active Hospital Problems   Diagnosis Date Noted  . Closed left hip fracture ([place]) 03/18/2021  . Hypertension   . Diabetes mellitus without complication ([place])   . CKD (chronic kidney disease), stage II   . CAD (coronary artery disease)   . History of CVA (cerebrovascular accident)     Resolved Hospital Problems  No resolved problems to display.    Discharge Condition: Stable.  Diet recommendation: Resume previous diet.  Vitals:   03/21/21 2102 03/22/21 0525  BP: (!) 130/56 (!) 144/57  Pulse: 70 (!) 58  Resp: 14 16  Temp: 98 F (36.7 C) 97.7 F (36.5 C)  SpO2: 99% 95%    History of present illness:  Dominic Mendoza a 69 y.o.malewith medical history significant forcoronary artery disease status post CABG and bioprosthetic aortic valve replacement in September 2021, PAD, history of CVA, chronic kidney disease stage II, and hypertension, who presented to the [facility] ED with proximal left leg pain after a fall. Work-up revealed displaced left femoral neck fracture.  Was seen by orthopedic surgery, had left total hip arthroplasty anterior approach by Dr. [name] on 03/18/2021.  03/22/21: Patient was seen and his wife was at his bedside.  He did not have any new complaints.  He has been working with PT OT who recommended CIR.  Patient has been accepted to inpatient rehab.  Patient is currently on full dose aspirin for DVT prophylaxis, postsurgery.    Patient was previously on dual antiplatelet  therapy aspirin and Plavix for history of CAD status post CABG, PVD, prior CVA.  Could not reach orthopedic surgery to find out when patient could restart his Plavix.  Defer to orthopedic surgery on when to restart Plavix.   Hospital Course:  Principal Problem:   Closed left hip fracture (HCC) Active Problems:   Hypertension   Diabetes mellitus without complication (H[facility])   CKD (chronic kidney disease), stage II   CAD (coronary artery disease)   History of CVA (cerebrovascular accident)  Left hip fracture post repair on 03/18/2021 by Dr. [name] POD #5. Management per orthopedic surgery. Pain management in place PT OT with orthopedic surgery's guidance recommending CIR. On full dose aspirin for DVT prophylaxis per orthopedic surgery. Defer to orthopedic surgery on when to restart Plavix.  Acute blood loss anemia post op. Drop in hemoglobin this morning 8.0K from 8.8K Asymptomatic Hemoglobin 11.1K on 03/18/2021.  Resolved leukocytosis likely reactive perioperatively Afebrile, nonseptic appearing with no evidence of active infective process. Currently afebrile with no leukocytosis.  CADstatus post CABG in September 2021. Peripheral vascular disease-Status-post left SFA atherectomy and stent in 2016. Status-post CABG in September 2021 Resume antiplatelets when okay with surgery. He is currently on the full dose aspirin 325 mg daily. Follow-up with your cardiologist and vascular surgeon.  Type II DM with hyperglycemia -A1c was 7.0% in January 2022 Resume home regimen.  Resolved post repletion: Hypomagnesemia Magnesium 1.5>> 1.8.  Hypertension BP stable Resume home regimen except for HCTZ to avoid worsening hyponatremia.  Mild euvolemic hyponatremia Serum sodium 133 Hold off HCTZ for now Follow-up  with PCP  Resolved ureteral spasm Received Pyridium 100 mg 3 times daily x3 doses.  CKD II -SCr is 1.26 on admission, was 1.12 in April 2022 Back to  his baseline creatinine 1.0 with GFR greater than 60. Continue to avoid nephrotoxins.  History of CVA  - Continue statin Resume DAPT when okay with orthopedic surgery.   Code Status:Full   Consults called:Orthopedic surgery    Discharge Exam: BP (!) 144/57 (BP Location: Right Arm)   Pulse (!) 58   Temp 97.7 F (36.5 C) (Oral)   Resp 16   Ht 5\' 9"  (1.753 m)   Wt 91.9 kg   SpO2 95%   BMI 29.92 kg/m  . General: 70 y.o. year-old male well developed well nourished in no acute distress.  Alert and oriented x3. . Cardiovascular: Regular rate and rhythm with no rubs or gallops.  No thyromegaly or JVD noted.   [name] Respiratory: Clear to auscultation with no wheezes or rales. Good inspiratory effort. . Abdomen: Soft nontender nondistended with normal bowel sounds x4 quadrants. . Musculoskeletal: No lower extremity edema.  [name] Psychiatry: Mood is appropriate for condition and setting  Discharge Instructions You were cared for by a hospitalist during your hospital stay. If you have any questions about your discharge medications or the care you received while you were in the hospital after you are discharged, you can call the unit and asked to speak with the hospitalist on call if the hospitalist that took care of you is not available. Once you are discharged, your primary care physician will handle any further medical issues. Please note that NO REFILLS for any discharge medications will be authorized once you are discharged, as it is imperative that you return to your primary care physician (or establish a relationship with a primary care physician if you do not have one) for your aftercare needs so that they can reassess your need for medications and monitor your lab values.   Allergies as of 03/22/2021      Reactions   Shrimp [shellfish Allergy] Rash      Medication List    STOP taking these medications   clopidogrel 75 MG tablet Commonly known as: PLAVIX    hydrochlorothiazide 25 MG tablet Commonly known as: HYDRODIURIL     TAKE these medications   aspirin 81 MG chewable tablet Commonly known as: Aspirin Childrens Chew 1 tablet (81 mg total) by mouth 2 (two) times daily with a meal. What changed: when to take this   benazepril 10 MG tablet Commonly known as: LOTENSIN Take 10 mg by mouth daily.   famotidine 20 MG tablet Commonly known as: PEPCID Take 20 mg by mouth 2 (two) times daily.   glipiZIDE 5 MG tablet Commonly known as: GLUCOTROL Take 5 mg by mouth 2 (two) times daily.   HYDROcodone-acetaminophen 5-325 MG tablet Commonly known as: NORCO/VICODIN Take 1-2 tablets by mouth every 4 (four) hours as needed for up to 7 days for moderate pain (pain score 4-6).   metFORMIN 500 MG 24 hr tablet Commonly known as: GLUCOPHAGE-XR Take 1,000 mg by mouth 2 (two) times daily.   metoprolol succinate 25 MG 24 hr tablet Commonly known as: TOPROL-XL Take 25 mg by mouth daily.   pioglitazone 15 MG tablet Commonly known as: ACTOS Take 15 mg by mouth daily.   rosuvastatin 10 MG tablet Commonly known as: CRESTOR Take 10 mg by mouth at bedtime.      Allergies  Allergen Reactions  . Shrimp [employer]  Allergy] Rash    Follow-up Information    Swinteck, Aaron Edelman, MD. Schedule an appointment as soon as possible for a visit in 2 weeks.   Specialty: Orthopedic Surgery Why: For wound re-check Contact information: [address] [place] [address] [phone]        [name], MD. Call in 1 day(s).   Specialty: Family Medicine Why: Please call for a posthospital follow-up appointment. Contact information: [place] [address] [phone]                The results of significant diagnostics from this hospitalization (including imaging, microbiology, ancillary and laboratory) are listed below for reference.    Significant Diagnostic Studies: DG Ribs Unilateral W/Chest Left  Result  Date: 03/17/2021 CLINICAL DATA:  Recent fall with left-sided chest pain, initial encounter EXAM: LEFT RIBS AND CHEST - 3+ VIEW COMPARISON:  12/16/2015 FINDINGS: Heart is at the upper limits of normal in size. Postsurgical changes are seen. Aortic calcifications are noted. The lungs are well aerated bilaterally with mild left lung atelectatic changes. No definitive rib fractures are seen. No pneumothorax is noted. IMPRESSION: No acute rib fracture is noted.  Mild left lung atelectasis is seen. Electronically Signed   By: Inez Catalina M.D.   On: 03/17/2021 22:37   Pelvis Portable  Result Date: 03/18/2021 CLINICAL DATA:  Status post left hip replacement. EXAM: PORTABLE PELVIS 1-2 VIEWS COMPARISON:  Fluoroscopic images of same day. FINDINGS: The left acetabular and femoral components are well situated. Expected postoperative changes are seen in the surrounding soft tissues. IMPRESSION: Status post left total hip arthroplasty. Electronically Signed   By: Marijo Conception M.D.   On: 03/18/2021 21:21   CT Hip Left Wo Contrast  Result Date: 03/18/2021 CLINICAL DATA:  Fracture, hip EXAM: CT OF THE LEFT HIP WITHOUT CONTRAST TECHNIQUE: Multidetector CT imaging of the left hip was performed according to the standard protocol. Multiplanar CT image reconstructions were also generated. COMPARISON:  Same day hip and femur radiographs. FINDINGS: Bones/Joint/Cartilage Nondisplaced subcapital fracture of the proximal femur. Femoral head is well seated within the acetabulum with relative preservation of the joint space. Ligaments Suboptimally assessed by CT. Muscles and Tendons Within normal limits. Soft tissues Small lipohemarthrosis. IMPRESSION: 1. Nondisplaced subcapital fracture of the proximal femur. 2. Small lipohemarthrosis. Electronically Signed   By: Dahlia Bailiff MD   On: 03/18/2021 00:37   DG Chest Port 1 View  Result Date: 03/18/2021 CLINICAL DATA:  Pre-op respiratory exam EXAM: PORTABLE CHEST 1 VIEW COMPARISON:   Mar 17, 2021. FINDINGS: Median sternotomy wires. The heart size and mediastinal contours are unchanged. Low lung volumes with bibasilar atelectasis. No visible pleural effusion or pneumothorax. The visualized skeletal structures are stable. IMPRESSION: Low lung volumes with bibasilar atelectasis. Electronically Signed   By: Dahlia Bailiff MD   On: 03/18/2021 01:09   DG C-Arm 1-60 Min-No Report  Result Date: 03/18/2021 Fluoroscopy was utilized by the requesting physician.  No radiographic interpretation.   DG HIP OPERATIVE UNILAT W OR W/O PELVIS LEFT  Result Date: 03/18/2021 CLINICAL DATA:  Left hip replacement EXAM: OPERATIVE LEFT HIP WITH PELVIS COMPARISON:  CT from earlier in the same day. FLUOROSCOPY TIME:  Radiation Exposure Index (as provided by the fluoroscopic device): 1.13 mGy If the device does not provide the exposure index: Fluoroscopy Time:  6 seconds Number of Acquired Images:  2 FINDINGS: Left hip prosthesis is noted in satisfactory position. No acute bony or soft tissue abnormality  is noted. IMPRESSION: Status post left hip replacement. Electronically Signed   By: Inez Catalina M.D.   On: 03/18/2021 20:26   DG Hip Unilat W or Wo Pelvis 2-3 Views Left  Result Date: 03/17/2021 CLINICAL DATA:  Recent fall with left hip pain, initial encounter EXAM: DG HIP (WITH OR WITHOUT PELVIS) 3V LEFT COMPARISON:  None. FINDINGS: Pelvic ring is intact. Mild irregularity is noted at the junction of the femoral neck and femoral head which may be related to an undisplaced fracture. CT would be helpful for further evaluation. IMPRESSION: Findings suspicious for subcapital femoral neck fracture. CT would be helpful for further evaluation. Electronically Signed   By: Inez Catalina M.D.   On: 03/17/2021 22:36   DG Femur Min 2 Views Left  Result Date: 03/17/2021 CLINICAL DATA:  Recent fall with left leg pain, initial encounter EXAM: LEFT FEMUR 2 VIEWS COMPARISON:  None. FINDINGS: SFA stenting is seen.  Irregularity in the subcapital femoral neck is again seen suspicious for fracture. More distal femur appears within normal limits. IMPRESSION: Irregularity in the subcapital femoral neck suspicious for undisplaced fracture. Electronically Signed   By: Inez Catalina M.D.   On: 03/17/2021 22:36    Microbiology: Recent Results (from the past 240 hour(s))  Resp Panel by RT-PCR (Flu A&B, Covid) Nasopharyngeal Swab     Status: None   Collection Time: 03/17/21 11:11 PM   Specimen: Nasopharyngeal Swab; Nasopharyngeal(NP) swabs in vial transport medium  Result Value Ref Range Status   SARS Coronavirus 2 by RT PCR NEGATIVE NEGATIVE Final    Comment: (NOTE) SARS-CoV-2 target nucleic acids are NOT DETECTED.  The SARS-CoV-2 RNA is generally detectable in upper respiratory specimens during the acute phase of infection. The lowest concentration of SARS-CoV-2 viral copies this assay can detect is 138 copies/mL. A negative result does not preclude SARS-Cov-2 infection and should not be used as the sole basis for treatment or other patient management decisions. A negative result may occur with  improper specimen collection/handling, submission of specimen other than nasopharyngeal swab, presence of viral mutation(s) within the areas targeted by this assay, and inadequate number of viral copies(<138 copies/mL). A negative result must be combined with clinical observations, patient history, and epidemiological information. The expected result is Negative.  Fact Sheet for Patients:  EntrepreneurPulse.com.au  Fact Sheet for Healthcare Providers:  IncredibleEmployment.be  This test is no t yet approved or cleared by the [place] FDA and  has been authorized for detection and/or diagnosis of SARS-CoV-2 by FDA under an Emergency Use Authorization (EUA). This EUA will remain  in effect (meaning this test can be used) for the duration of the COVID-19 declaration under  Section 564(b)(1) of the Act, 21 U.S.C.section 360bbb-3(b)(1), unless the authorization is terminated  or revoked sooner.       Influenza A by PCR NEGATIVE NEGATIVE Final   Influenza B by PCR NEGATIVE NEGATIVE Final    Comment: (NOTE) The Xpert Xpress SARS-CoV-2/FLU/RSV plus assay is intended as an aid in the diagnosis of influenza from Nasopharyngeal swab specimens and should not be used as a sole basis for treatment. Nasal washings and aspirates are unacceptable for Xpert Xpress SARS-CoV-2/FLU/RSV testing.  Fact Sheet for Patients: EntrepreneurPulse.com.au  Fact Sheet for Healthcare Providers: IncredibleEmployment.be  This test is not yet approved or cleared by the [place] FDA and has been authorized for detection and/or diagnosis of SARS-CoV-2 by FDA under an Emergency Use Authorization (EUA). This EUA will remain in effect (meaning this test can  be used) for the duration of the COVID-19 declaration under Section 564(b)(1) of the Act, 21 U.S.C. section 360bbb-3(b)(1), unless the authorization is terminated or revoked.  Performed at [facility], [place] [address] [place], [place] [address]      Labs: Basic Metabolic Panel: Recent Labs  Lab 03/18/21 0311 03/19/21 0309 03/20/21 0230 03/21/21 0745 03/22/21 0751  NA 136 135 137 133* 133*  K 4.0 4.4 4.2 4.4 4.2  CL 105 104 101 101 101  CO2 23 26 27 25 26   GLUCOSE 169* 275* 171* 223* 233*  BUN 22 29* 31* 26* 24*  CREATININE 1.13 1.34* 1.19 1.00 1.03  CALCIUM 8.9 8.3* 9.0 8.7* 8.6*  MG  --  1.5*  --  1.8 1.8  PHOS  --  4.1  --   --  3.9   Liver Function Tests: Recent Labs  Lab 03/17/21 2256  AST 13*  ALT 13  ALKPHOS 83  BILITOT 0.5  PROT 7.0  ALBUMIN 3.9   No results for input(s): LIPASE, AMYLASE in the last 168 hours. No results for input(s): AMMONIA in the last 168 hours. CBC: Recent Labs  Lab 03/17/21 2256 03/18/21 0311 03/19/21 0309  03/20/21 0230 03/21/21 0308 03/21/21 1412 03/22/21 0751  WBC 11.1* 11.8* 16.4* 11.9* 10.5  --  9.1  NEUTROABS 8.5*  --   --   --   --   --   --   HGB 10.7* 11.1* 9.6* 9.2* 8.8* 8.2* 8.0*  HCT 32.8* 35.0* 29.2* 28.4* 27.4* 25.7* 24.6*  MCV 91.4 93.1 92.1 91.9 93.2  --  93.2  PLT 165 160 138* 137* 140*  --  143*   Cardiac Enzymes: No results for input(s): CKTOTAL, CKMB, CKMBINDEX, TROPONINI in the last 168 hours. BNP: BNP (last 3 results) No results for input(s): BNP in the last 8760 hours.  ProBNP (last 3 results) No results for input(s): PROBNP in the last 8760 hours.  CBG: Recent Labs  Lab 03/21/21 1140 03/21/21 1608 03/21/21 2057 03/22/21 0726 03/22/21 1202  GLUCAP 236* 221* 316* 225* 342*       Signed:  Kayleen Memos, MD [facility] 03/22/2021, 12:52 PM

## 2021-03-22 NOTE — Progress Notes (Signed)
Signed        PMR Admission Coordinator Pre-Admission Assessment   Patient: Dominic Mendoza is an 70 y.o., male MRN: [id number] DOB: October 23, 1951 Height: 5\' 9"  (175.3 cm) Weight: 91.9 kg   Insurance Information HMO:     PPO:      PCP:      IPA:      80/20: yes     OTHER:  PRIMARY: Medicare A & B      Policy#: [id number]      Subscriber: patient CM Name:       Phone#:      Fax#:  Pre-Cert#:       Employer:  Benefits:  Phone #: verified eligibility via [place] on 03/20/21     Name:  Eff. Date: Part A  B effective 04/30/17     Deduct: $1,556      Out of Pocket Max: NA      Life Max: NA CIR: 100% with Medicare approval      SNF: 100% days 1-20, 80% days 21-100 Outpatient: 80%     Co-Pay: 20% Home Health: 100%      Co-Pay:  DME: 80%     Co-Pay: 20% Providers: pt's choice SECONDARY: [facility]      Policy#: [id number]     Phone#: [phone]   Financial Counselor:       Phone#:    The "Data Collection Information Summary" for patients in Inpatient Rehabilitation Facilities with attached "Privacy Act [place] Records" was provided and verbally reviewed with: Patient and Family   Emergency Contact Information         Contact Information     Name Relation Home Work Mobile    Guse,Tracey W Spouse [phone]           Current Medical History  Patient Admitting Diagnosis: closed left hip fx, s/p left THA History of Present Illness: Pt is a 70 year old male with medical hx significant for: PAD, h/o CVA, HTN, CKD stage II, CAD s/p CABG and bioprosthetic aortic valve replacement (September 2021), and DM. Pt presented to ED on 03/17/21 after a fall resulting in abrasions to left knee and left elbow as well as pain in left thigh. CT scan revealed displaced left femoral neck fx. Pt is s/p left THA by anterior direct approach on 03/18/21. Therapy evaluated pt and recommended CIR d/t pt's deficits in mobility and balance, decreased strength, and  weakness.     Patient's medical record from [facility] has been reviewed by the rehabilitation admission coordinator and physician.   Past Medical History      Past Medical History:  Diagnosis Date  . Arthritis    . Cancer [facility])      prostate  . Diabetes mellitus without complication ([place])    . Dyspnea    . GERD (gastroesophageal reflux disease)    . Hypertension    . Smokers' cough ([place])        Family History   family history is not on file.   Prior Rehab/Hospitalizations Has the patient had prior rehab or hospitalizations prior to admission? Yes   Has the patient had major surgery during 100 days prior to admission? Yes              Current Medications   Current Facility-Administered Medications:  .  acetaminophen (TYLENOL) tablet 325-650 mg, 325-650 mg, Oral, Q6H PRN, Rod Can, MD, 650 mg at 03/22/21 0540 .  amLODipine (NORVASC) tablet 5 mg,  5 mg, Oral, Daily, Swinteck, Aaron Edelman, MD, 5 mg at 03/21/21 0818 .  aspirin EC tablet 325 mg, 325 mg, Oral, Q breakfast, Swinteck, Aaron Edelman, MD, 325 mg at 03/22/21 0743 .  docusate sodium (COLACE) capsule 100 mg, 100 mg, Oral, BID, Swinteck, Aaron Edelman, MD, 100 mg at 03/22/21 0837 .  famotidine (PEPCID) tablet 20 mg, 20 mg, Oral, BID, Hall, Carole N, DO, 20 mg at 03/22/21 7564 .  HYDROcodone-acetaminophen (NORCO) 7.5-325 MG per tablet 1-2 tablet, 1-2 tablet, Oral, Q4H PRN, Rod Can, MD, 1 tablet at 03/20/21 1830 .  HYDROcodone-acetaminophen (NORCO/VICODIN) 5-325 MG per tablet 1-2 tablet, 1-2 tablet, Oral, Q4H PRN, Rod Can, MD, 1 tablet at 03/22/21 0035 .  HYDROmorphone (DILAUDID) injection 0.5 mg, 0.5 mg, Intravenous, Q2H PRN, Rod Can, MD, 0.5 mg at 03/18/21 1456 .  insulin aspart (novoLOG) injection 0-15 Units, 0-15 Units, Subcutaneous, TID WC, Kayleen Memos, DO, 5 Units at 03/22/21 [phone] .  insulin aspart (novoLOG) injection 0-5 Units, 0-5 Units, Subcutaneous, QHS, Kayleen Memos, DO, 4 Units at 03/21/21 2150 .  insulin  glargine (LANTUS) injection 8 Units, 8 Units, Subcutaneous, Daily, Kayleen Memos, DO, 8 Units at 03/22/21 0754 .  menthol-cetylpyridinium (CEPACOL) lozenge 3 mg, 1 lozenge, Oral, PRN **OR** phenol (CHLORASEPTIC) mouth spray 1 spray, 1 spray, Mouth/Throat, PRN, Swinteck, Aaron Edelman, MD .  methocarbamol (ROBAXIN) 500 mg in dextrose 5 % 50 mL IVPB, 500 mg, Intravenous, Q6H PRN, Hall, Carole N, DO .  methocarbamol (ROBAXIN) tablet 500 mg, 500 mg, Oral, Q6H PRN, Hall, Carole N, DO, 500 mg at 03/22/21 0540 .  metoCLOPramide (REGLAN) tablet 5-10 mg, 5-10 mg, Oral, Q8H PRN **OR** metoCLOPramide (REGLAN) injection 5-10 mg, 5-10 mg, Intravenous, Q8H PRN, Swinteck, Aaron Edelman, MD .  metoprolol succinate (TOPROL-XL) 24 hr tablet 25 mg, 25 mg, Oral, Daily, Hall, Carole N, DO, 25 mg at 03/21/21 0817 .  morphine 2 MG/ML injection 0.5-1 mg, 0.5-1 mg, Intravenous, Q2H PRN, Swinteck, Aaron Edelman, MD .  ondansetron (ZOFRAN) injection 4 mg, 4 mg, Intravenous, Q6H PRN, Swinteck, Aaron Edelman, MD .  ondansetron (ZOFRAN) tablet 4 mg, 4 mg, Oral, Q6H PRN **OR** ondansetron (ZOFRAN) injection 4 mg, 4 mg, Intravenous, Q6H PRN, Swinteck, Aaron Edelman, MD .  rosuvastatin (CRESTOR) tablet 10 mg, 10 mg, Oral, QHS, Hall, Carole N, DO, 10 mg at 03/21/21 2150 .  senna (SENOKOT) tablet 8.6 mg, 1 tablet, Oral, QHS, Swinteck, Aaron Edelman, MD, 8.6 mg at 03/21/21 2150   Patients Current Diet:     Diet Order                      Diet heart healthy/carb modified Room service appropriate? Yes; Fluid consistency: Thin  Diet effective now                      Precautions / Restrictions Precautions Precautions: Fall Restrictions Weight Bearing Restrictions: No Other Position/Activity Restrictions: WBAT    Has the patient had 2 or more falls or a fall with injury in the past year? Yes   Prior Activity Level Community (5-7x/wk): drives, works   Prior Functional Level Self Care: Did the patient need help bathing, dressing, using the toilet or eating?  Independent   Indoor Mobility: Did the patient need assistance with walking from room to room (with or without device)? Independent   Stairs: Did the patient need assistance with internal or external stairs (with or without device)? Independent   Functional Cognition: Did the patient need help planning regular tasks  such as shopping or remembering to take medications? Independent   Home Assistive Devices / Equipment Home Assistive Devices/Equipment: None Home Equipment: Walker - 2 wheels   Prior Device Use: Indicate devices/aids used by the patient prior to current illness, exacerbation or injury? Walker (used after heart surgery; no AD prior to heart surgery)   Current Functional Level Cognition   Overall Cognitive Status: Within Functional Limits for tasks assessed Orientation Level: Oriented X4    Extremity Assessment (includes Sensation/Coordination)   Upper Extremity Assessment: Generalized weakness  Lower Extremity Assessment: Generalized weakness,LLE deficits/detail LLE Deficits / Details: Strength at hip 2/5 with AAROM at hip to 75 flex and 15 abd     ADLs         Mobility   Overal bed mobility: Needs Assistance Bed Mobility: Supine to Sit Supine to sit: Min assist,Mod assist General bed mobility comments: Increased time with cues for sequence and assist to manage L LE as well as to bring trunk to upright     Transfers   Overall transfer level: Needs assistance Equipment used: Rolling walker (2 wheeled) Transfers: Sit to/from Stand Sit to Stand: Min assist General transfer comment: cues for LE management and use of UEs to self assist.  Physical assist to bring wt up and fwd and to balance in standing     Ambulation / Gait / Stairs / Wheelchair Mobility   Ambulation/Gait Ambulation/Gait assistance: Min assist,Min guard Gait Distance (Feet): 70 Feet Assistive device: Rolling walker (2 wheeled) Gait Pattern/deviations: Step-to pattern,Decreased step length -  right,Decreased step length - left,Shuffle,Trunk flexed General Gait Details: Increased time with cues for sequence, posture and position from RW. Gait velocity: decr     Posture / Balance Balance Overall balance assessment: Needs assistance Sitting-balance support: No upper extremity supported,Feet supported Sitting balance-Leahy Scale: Good Standing balance support: Bilateral upper extremity supported Standing balance-Leahy Scale: Poor     Special needs/care consideration Skin Abrasion: elbow, knee/left; Surgical incision: left hip, Diabetic management novoLOG: 0-5 units: daily at bedtime; novoLOG 0-15 units: 3x daily at meals, External Urinary Catheter and Designated visitor Hektor Huston, wife    Previous Home Environment (from acute therapy documentation) Living Arrangements: Spouse/significant other  Lives With: Spouse Available Help at Discharge: Family,Available 24 hours/day Type of Home: House Home Layout: One level Home Access: Stairs to enter Entrance Stairs-Rails: None (rails by stairs in front of house) Entrance Stairs-Number of Steps: 1 in garage, 5 in front of house Bathroom Shower/Tub: Chiropodist: Standard Bathroom Accessibility: Yes How Accessible: Accessible via walker [place]: No   Discharge Living Setting Plans for Discharge Living Setting: Patient's home Type of Home at Discharge: House Discharge Home Layout: One level Discharge Home Access: Stairs to enter Entrance Stairs-Rails: None (rails by steps in front of house) Entrance Stairs-Number of Steps: 1 in garage, 5 in front Discharge Bathroom Shower/Tub: Tub/shower unit Discharge Bathroom Toilet: Standard Discharge Bathroom Accessibility: Yes How Accessible: Accessible via walker Does the patient have any problems obtaining your medications?: No   Social/Family/Support Systems Anticipated Caregiver: Palmer Fahrner, wife Anticipated Caregiver's Contact Information:  [phone] Caregiver Availability: 24/7 Discharge Plan Discussed with Primary Caregiver: Yes Is Caregiver In Agreement with Plan?: Yes Does Caregiver/Family have Issues with Lodging/Transportation while Pt is in Rehab?: No   Goals Patient/Family Goal for Rehab: Mod I-Supervision: PT/OT Expected length of stay: 7-10 days Pt/Family Agrees to Admission and willing to participate: Yes Program Orientation Provided & Reviewed with Pt/Caregiver Including Roles  & Responsibilities: Yes  Decrease burden of Care through IP rehab admission: NA           Possible need for SNF placement upon discharge: Not anticipated   Patient Condition: I have reviewed medical records from [facility], spoken with CSW, and patient and spouse. I discussed via phone for inpatient rehabilitation assessment.  Patient will benefit from ongoing PT and OT, can actively participate in 3 hours of therapy a day 5 days of the week, and can make measurable gains during the admission.  Patient will also benefit from the coordinated team approach during an Inpatient Acute Rehabilitation admission.  The patient will receive intensive therapy as well as Rehabilitation physician, nursing, social worker, and care management interventions.  Due to safety, skin/wound care, disease management, medication administration, pain management and patient education the patient requires 24 hour a day rehabilitation nursing.  The patient is currently Min G-Mod A with mobility and basic ADLs.  Discharge setting and therapy post discharge at home with home health is anticipated.  Patient has agreed to participate in the Acute Inpatient Rehabilitation Program and will admit today.   Preadmission Screen Completed By:  Bethel Born, 03/22/2021 10:52 AM ______________________________________________________________________   Discussed status with Dr. Naaman Plummer on 03/22/21  at 10:52 AM and received approval for admission today.   Admission  Coordinator:  Bethel Born, CCC-SLP, time 10:52 AM/Date 03/22/21     Assessment/Plan: Diagnosis: left hip fx, left THA 1. Does the need for close, 24 hr/day Medical supervision in concert with the patient's rehab needs make it unreasonable for this patient to be served in a less intensive setting? Yes 2. Co-Morbidities requiring supervision/potential complications: PAD, HTN, CKD, CAD 3. Due to bladder management, bowel management, safety, skin/wound care, disease management, medication administration, pain management and patient education, does the patient require 24 hr/day rehab nursing? Yes 4. Does the patient require coordinated care of a physician, rehab nurse, PT, OT, and SLP to address physical and functional deficits in the context of the above medical diagnosis(es)? Yes Addressing deficits in the following areas: balance, endurance, locomotion, strength, transferring, bowel/bladder control, bathing, dressing, feeding, grooming, toileting and psychosocial support 5. Can the patient actively participate in an intensive therapy program of at least 3 hrs of therapy 5 days a week? Yes 6. The potential for patient to make measurable gains while on inpatient rehab is excellent 7. Anticipated functional outcomes upon discharge from inpatient rehab: modified independent PT, modified independent OT, n/a SLP 8. Estimated rehab length of stay to reach the above functional goals is: 7-10 days 9. Anticipated discharge destination: Home 10. Overall Rehab/Functional Prognosis: excellent     MD Signature: Meredith Staggers, MD, [facility] 03/22/2021

## 2021-03-23 DIAGNOSIS — G8918 Other acute postprocedural pain: Secondary | ICD-10-CM

## 2021-03-23 DIAGNOSIS — I10 Essential (primary) hypertension: Secondary | ICD-10-CM

## 2021-03-23 DIAGNOSIS — E1165 Type 2 diabetes mellitus with hyperglycemia: Secondary | ICD-10-CM | POA: Diagnosis not present

## 2021-03-23 DIAGNOSIS — S72002A Fracture of unspecified part of neck of left femur, initial encounter for closed fracture: Secondary | ICD-10-CM | POA: Diagnosis not present

## 2021-03-23 DIAGNOSIS — E871 Hypo-osmolality and hyponatremia: Secondary | ICD-10-CM | POA: Diagnosis not present

## 2021-03-23 DIAGNOSIS — I951 Orthostatic hypotension: Secondary | ICD-10-CM

## 2021-03-23 DIAGNOSIS — D62 Acute posthemorrhagic anemia: Secondary | ICD-10-CM

## 2021-03-23 LAB — CBC WITH DIFFERENTIAL/PLATELET
Abs Immature Granulocytes: 0.19 10*3/uL — ABNORMAL HIGH (ref 0.00–0.07)
Basophils Absolute: 0 10*3/uL (ref 0.0–0.1)
Basophils Relative: 0 %
Eosinophils Absolute: 0.5 10*3/uL (ref 0.0–0.5)
Eosinophils Relative: 5 %
HCT: 24.8 % — ABNORMAL LOW (ref 39.0–52.0)
Hemoglobin: 8.1 g/dL — ABNORMAL LOW (ref 13.0–17.0)
Immature Granulocytes: 2 %
Lymphocytes Relative: 18 %
Lymphs Abs: 1.7 10*3/uL (ref 0.7–4.0)
MCH: 30.2 pg (ref 26.0–34.0)
MCHC: 32.7 g/dL (ref 30.0–36.0)
MCV: 92.5 fL (ref 80.0–100.0)
Monocytes Absolute: 0.8 10*3/uL (ref 0.1–1.0)
Monocytes Relative: 9 %
Neutro Abs: 5.8 10*3/uL (ref 1.7–7.7)
Neutrophils Relative %: 66 %
Platelets: 153 10*3/uL (ref 150–400)
RBC: 2.68 MIL/uL — ABNORMAL LOW (ref 4.22–5.81)
RDW: 14.5 % (ref 11.5–15.5)
WBC: 9 10*3/uL (ref 4.0–10.5)
nRBC: 0 % (ref 0.0–0.2)

## 2021-03-23 LAB — COMPREHENSIVE METABOLIC PANEL
ALT: 13 U/L (ref 0–44)
AST: 21 U/L (ref 15–41)
Albumin: 2.5 g/dL — ABNORMAL LOW (ref 3.5–5.0)
Alkaline Phosphatase: 69 U/L (ref 38–126)
Anion gap: 7 (ref 5–15)
BUN: 17 mg/dL (ref 8–23)
CO2: 25 mmol/L (ref 22–32)
Calcium: 8.7 mg/dL — ABNORMAL LOW (ref 8.9–10.3)
Chloride: 101 mmol/L (ref 98–111)
Creatinine, Ser: 0.94 mg/dL (ref 0.61–1.24)
GFR, Estimated: >60 mL/min (ref >60)
Glucose, Bld: 259 mg/dL — ABNORMAL HIGH (ref 70–99)
Potassium: 4.4 mmol/L (ref 3.5–5.1)
Sodium: 133 mmol/L — ABNORMAL LOW (ref 135–145)
Total Bilirubin: 0.8 mg/dL (ref 0.3–1.2)
Total Protein: 5.6 g/dL — ABNORMAL LOW (ref 6.5–8.1)

## 2021-03-23 LAB — GLUCOSE, CAPILLARY
Glucose-Capillary: 259 mg/dL — ABNORMAL HIGH (ref 70–99)
Glucose-Capillary: 186 mg/dL — ABNORMAL HIGH (ref 70–99)
Glucose-Capillary: 207 mg/dL — ABNORMAL HIGH (ref 70–99)
Glucose-Capillary: 265 mg/dL — ABNORMAL HIGH (ref 70–99)

## 2021-03-23 NOTE — Evaluation (Signed)
Occupational Therapy Assessment and Plan  Patient Details  Name: Dominic Mendoza MRN: [id number] Date of Birth: 04/15/1951  OT Diagnosis: acute pain, muscle weakness (generalized) and pain in joint Rehab Potential: Rehab Potential (ACUTE ONLY): Good ELOS: 5-7 days   Today's Date: 03/23/2021 OT Individual Time: 0800-0900, 1300-1345 OT Individual Time Calculation (min): 60 min   And 45 min  Hospital Problem: Principal Problem:   Left displaced femoral neck fracture (HCC) Active Problems:   Hyponatremia   Uncontrolled type 2 diabetes mellitus with hyperglycemia (HCC)   Essential hypertension   Acute blood loss anemia   Postoperative pain   Orthostatic hypotension   Past Medical History:  Past Medical History:  Diagnosis Date  . Arthritis   . Cancer [facility])    prostate  . Diabetes mellitus without complication ([place])   . Dyspnea   . GERD (gastroesophageal reflux disease)   . Hypertension   . Smokers' cough [facility])    Past Surgical History:  Past Surgical History:  Procedure Laterality Date  . COLONOSCOPY    . CORONARY ARTERY BYPASS GRAFT     triple  . LYMPHADENECTOMY Bilateral 02/25/2013   Procedure: LYMPHADENECTOMY;  Surgeon: Dutch Gray, MD;  Location: [facility];  Service: Urology;  Laterality: Bilateral;  Bilateral Pelvic   . ROBOT ASSISTED LAPAROSCOPIC RADICAL PROSTATECTOMY N/A 02/25/2013   Procedure: ROBOTIC ASSISTED LAPAROSCOPIC RADICAL PROSTATECTOMY LEVEL 2;  Surgeon: Dutch Gray, MD;  Location: [facility];  Service: Urology;  Laterality: N/A;  . stent left leg    . TOTAL HIP ARTHROPLASTY Left 03/18/2021   Procedure: TOTAL HIP ARTHROPLASTY ANTERIOR APPROACH;  Surgeon: Rod Can, MD;  Location: [facility];  Service: Orthopedics;  Laterality: Left;    Assessment & Plan Clinical Impression: Patient is a 70 y.o. year old male with recent admission to hospital following debility secondary to displaced left femoral neck fracture. Status post left total hip arthroplasty anterior  approach 03/18/2021 per Dr. [name]. History of prostate cancer with laparoscopic radical prostatectomy 2014, PAD status post left SFA and stent 2016, obesity with BMI 29.92, diabetes mellitus, hypertension, CAD with CABG 2014 and aortic valve replacement September 2021 maintained on aspirin and Plavix, tobacco use, CKD stage III. Patient transferred to CIR on 03/22/2021 .    Patient currently requires mod with basic self-care skills secondary to muscle weakness and muscle joint tightness, decreased cardiorespiratoy endurance and decreased standing balance, decreased postural control and decreased balance strategies.  Prior to hospitalization, patient could complete ADLs/iADLs at independent level.  Patient will benefit from skilled intervention to decrease level of assist with basic self-care skills, increase independence with basic self-care skills and increase level of independence with iADL prior to discharge home with care partner.  Anticipate patient will require intermittent supervision and follow up home health.  OT - End of Session Activity Tolerance: Decreased this session Endurance Deficit: Yes Endurance Deficit Description: Limits from orthostatic vitals, generalized weakness, pain in L hip OT Assessment Rehab Potential (ACUTE ONLY): Good OT Barriers to Discharge: Home environment access/layout OT Barriers to Discharge Comments: Steps within 1 level OT Patient demonstrates impairments in the following area(s): Balance;Endurance;Motor;Pain OT Basic ADL's Functional Problem(s): Grooming;Bathing;Dressing;Toileting OT Advanced ADL's Functional Problem(s): Simple Meal Preparation;Light Housekeeping OT Transfers Functional Problem(s): Toilet;Tub/Shower OT Additional Impairment(s): None OT Plan OT Intensity: Minimum of 1-2 x/day, 45 to 90 minutes OT Frequency: 5 out of 7 days OT Duration/Estimated Length of Stay: 5-7 days OT Treatment/Interventions: Balance/vestibular training;Community  reintegration;Discharge planning;Pain management;UE/LE Strength taining/ROM;Functional mobility training;Self Care/advanced ADL retraining;Therapeutic  Exercise;Therapeutic Activities;Patient/family education;DME/adaptive equipment instruction;Psychosocial support OT Basic Self-Care Anticipated Outcome(s): close spvsn OT Toileting Anticipated Outcome(s): close spvsn OT Bathroom Transfers Anticipated Outcome(s): close spvsn OT Recommendation Recommendations for Other Services: Therapeutic Recreation consult Therapeutic Recreation Interventions: Outing/community reintergration Patient destination: Home Follow Up Recommendations: Home health OT Equipment Recommended: 3 in 1 bedside comode;Tub/shower bench;To be determined Equipment Details: Pt owns hospital bed   OT Evaluation Precautions/Restrictions  Precautions Precautions: Fall Restrictions Weight Bearing Restrictions: No Other Position/Activity Restrictions: WBAT General Chart Reviewed: Yes Family/Caregiver Present: No Vital Signs   At 0800 eval session: pt symptomatic with orthostatic vital signs Sitting EOB: BP 107/77, O2% 97, 71 HR Standing with RW: BP 65/51 Trendelenburg: BP 166/68   Pain Pain Assessment Pain Scale: 0-10 Pain Score:  (unrated) Pain Type: Surgical pain Pain Location: Hip Pain Orientation: Left Pain Descriptors / Indicators: Aching Pain Onset: With Activity Pain Intervention(s): Rest;Distraction Home Living/Prior Functioning Home Living Family/patient expects to be discharged to:: Unsure Living Arrangements: Spouse/significant other Available Help at Discharge: Family,Available 24 hours/day Type of Home: House Home Access: Stairs to enter [employer] of Steps: 1 in garage, 3 in front of house Entrance Stairs-Rails: Right Home Layout: One level (some steps within 1 level) Bathroom Shower/Tub: Optometrist: Yes  Lives With:  Spouse,Son IADL History Homemaking Responsibilities: Yes Current License: Yes Mode of Transportation: Car Occupation: Full time employment Type of Occupation: Runs an Conservator, museum/gallery Prior Function Level of Independence: Independent with homemaking with ambulation,Independent with basic ADLs,Independent with transfers Driving: Yes Vocation: Full time employment Vocation Requirements: owns Freight [employer]er Comments: Did not use assistive device but spouse states has been unsteady Vision Baseline Vision/History: Wears glasses Wears Glasses: Reading only Patient Visual Report: No change from baseline Vision Assessment?: No apparent visual deficits Perception  Perception: Within Functional Limits Praxis Praxis: Intact Cognition Overall Cognitive Status: Within Functional Limits for tasks assessed Arousal/Alertness: Awake/alert Orientation Level: Person;Place;Situation Person: Oriented Place: Oriented Situation: Oriented Year: 2022 Month: May Day of Week: Correct Memory: Impaired Memory Impairment: Retrieval deficit;Decreased short term memory Decreased Short Term Memory: Verbal basic Immediate Memory Recall: Sock;Blue;Bed Memory Recall Sock: Without Cue Memory Recall Blue: Not able to recall Memory Recall Bed: Not able to recall Attention: Sustained;Alternating Sustained Attention: Appears intact Awareness: Appears intact Problem Solving: Appears intact Executive Function: Reasoning;Sequencing;Decision Making;Self Monitoring Reasoning: Appears intact Sequencing: Appears intact Decision Making: Appears intact Self Monitoring: Appears intact Safety/Judgment: Appears intact Sensation Sensation Light Touch: Appears Intact Hot/Cold: Appears Intact Proprioception: Appears Intact Stereognosis: Appears Intact Coordination Gross Motor Movements are Fluid and Coordinated: No Fine Motor Movements are Fluid and Coordinated: Yes Coordination and Movement Description: Limited by  L hip pain Motor  Motor Motor: Within Functional Limits Motor - Skilled Clinical Observations: generalized weakness, pain  Trunk/Postural Assessment  Cervical Assessment Cervical Assessment: Within Functional Limits Thoracic Assessment Thoracic Assessment: Within Functional Limits Lumbar Assessment Lumbar Assessment: Within Functional Limits Postural Control Postural Control: Within Functional Limits  Balance Balance Balance Assessed: Yes (Simultaneous filing. User may not have seen previous data.) Static Sitting Balance Static Sitting - Balance Support: No upper extremity supported;Feet supported Static Sitting - Level of Assistance: 5: Stand by assistance Dynamic Sitting Balance Dynamic Sitting - Balance Support: Bilateral upper extremity supported;Feet supported Dynamic Sitting - Level of Assistance: 5: Stand by assistance Dynamic Sitting - Balance Activities: Lateral lean/weight shifting;Forward lean/weight shifting;Reaching for objects;Reaching across midline Static Standing Balance Static Standing - Balance Support: Bilateral upper extremity supported Static Standing - Level of  Assistance: 5: Stand by assistance Static Standing - Comment/# of Minutes: UE support on RW Dynamic Standing Balance Dynamic Standing - Balance Activities:  (pulling down/up pants) Extremity/Trunk Assessment RUE Assessment RUE Assessment: Within Functional Limits General Strength Comments: grossly assessed in functional tasks LUE Assessment LUE Assessment: Within Functional Limits General Strength Comments: grossly assessed in functional tasks  Care Tool Care Tool Self Care Eating   Eating Assist Level: Set up assist    Oral Care    Oral Care Assist Level: Set up assist    Bathing   Body parts bathed by patient: Right arm;Left arm;Chest;Abdomen;Right upper leg;Left upper leg;Right lower leg;Face Body parts bathed by helper: Left lower leg   Assist Level: Minimal Assistance - Patient >  75% (bed level; did not complete peri hygiene as he had been recently cleaned/changed)    Upper Body Dressing(including orthotics)   What is the patient wearing?: Pull over shirt   Assist Level: Minimal Assistance - Patient > 75%    Lower Body Dressing (excluding footwear)   What is the patient wearing?: Pants Assist for lower body dressing: Moderate Assistance - Patient 50 - 74%    Putting on/Taking off footwear   What is the patient wearing?: Socks;Ted hose Assist for footwear: Maximal Assistance - Patient 25 - 49%       Care Tool Toileting Toileting activity         Care Tool Bed Mobility Roll left and right activity   Roll left and right assist level: Minimal Assistance - Patient > 75% (bed rails)    Sit to lying activity   Sit to lying assist level: Moderate Assistance - Patient 50 - 74%    Lying to sitting edge of bed activity   Lying to sitting edge of bed assist level: Moderate Assistance - Patient 50 - 74%     Care Tool Transfers Sit to stand transfer   Sit to stand assist level: Minimal Assistance - Patient > 75%    Chair/bed transfer Chair/bed transfer activity did not occur: Safety/medical concerns (orthostatic vitals/symptomatic)       Toilet transfer Toilet transfer activity did not occur: Safety/medical concerns (orthostatic vitals/symptomatic)       Care Tool Cognition Expression of Ideas and Wants Expression of Ideas and Wants: Some difficulty - exhibits some difficulty with expressing needs and ideas (e.g, some words or finishing thoughts) or speech is not clear   Understanding Verbal and Non-Verbal Content Understanding Verbal and Non-Verbal Content: Usually understands - understands most conversations, but misses some part/intent of message. Requires cues at times to understand   Memory/Recall Ability *first 3 days only Memory/Recall Ability *first 3 days only: Current season;Location of own room;Staff names and faces;That he or she is in a  hospital/hospital unit    Refer to Care Plan for [place] [facility] OT Short Term Goal 1 (Week 1): Patient will complete LB bathing/dressing min A using AE PRN. OT Short Term Goal 2 (Week 1): Patient will complete 3/3 toileting tasks with spvsn and LRAD. OT Short Term Goal 3 (Week 1): Patient will complete stand pivot transfers using LRAD with CGA. OT Short Term Goal 4 (Week 1): Patient will complete dynamic standing grooming task with LRAD with CGA.  Recommendations for other services: Therapeutic Recreation  Outing/community reintegration   Skilled Therapeutic Intervention Session 1: Pt received supine in bed, agreeable to OT eval session. Role of OT, PLOF, and rehab process discussed with good understanding demo'd. Sup<>sit  mod A using bed rails. Pt reporting feeling dizzy and light headed seated EOB; completed vitals check seated, standing, and supine (see above vitals section) and pt demo'd orthostatic vital signs. As pt was symptomatic, further mobility postponed until later session. Following improved vitals, pt positioned in supported sitting in bed for UB/LB bathing/dressing. See ADL section below for assist levels. Pt limited in ROM, overall strength, and pain in LLE for LB bathing/dressing tasks, but demo's good potential for improved independence in BADLs. Pt left supine, alarm set, call bell in reach, and all needs met.   Session 2: Pt received supine in bed, agreeable to OT session, wife present throughout session. Pt and family education throughout session on DME/AE recommendations, functional mobility, and BADLs. Pt no longer symptomatic and demo'd improved functional mobility. Sup>sit min A with bed rails, sit<>stand min A-CGA, ambulation with RW ~15' CGA, and stand pivot transfers bed>w/c>TTB CGA. Pt required mod vc's and tactile cues for body mechanics, hand placement, and safe use of RW during transfers. Pt/family education on TTB and tub shower transfer; pt  completed with min A and vc's for body mechanics, including using gait belt as a leg lifter as pt could not flex hip to bring over tub threshold. Pt demo'd good understanding through teach back method of using reacher and sock aid to don L non-skid sock. Pt left seated in recliner on w/c cushion, alarm set, call bell in reach, family present, and all needs met.  ADL ADL Eating: Set up Where Assessed-Eating: Bed level Grooming: Setup Where Assessed-Grooming: Bed level Upper Body Bathing: Setup Where Assessed-Upper Body Bathing: Bed level Lower Body Bathing: Moderate assistance Where Assessed-Lower Body Bathing: Bed level Upper Body Dressing: Minimal assistance Where Assessed-Upper Body Dressing: Bed level Lower Body Dressing: Moderate assistance Where Assessed-Lower Body Dressing: Bed level Toileting: Not assessed Toilet Transfer: Not assessed Tub/Shower Transfer: Not assessed ADL Comments: Toileting/showering not assessed due to orthostatic vitals/symptomatic Mobility  Bed Mobility Bed Mobility: Supine to Sit;Rolling Right;Rolling Left;Scooting to [facility] Rolling Right: Minimal Assistance - Patient > 75% Rolling Left: Minimal Assistance - Patient > 75% Supine to Sit: Minimal Assistance - Patient > 75% Scooting to HOB: Moderate Assistance - Patient 50-74% Transfers Sit to Stand: Minimal Assistance - Patient > 75% Stand to Sit: Minimal Assistance - Patient > 75%   Discharge Criteria: Patient will be discharged from OT if patient refuses treatment 3 consecutive times without medical reason, if treatment goals not met, if there is a change in medical status, if patient makes no progress towards goals or if patient is discharged from hospital.  The above assessment, treatment plan, treatment alternatives and goals were discussed and mutually agreed upon: by patient  Mellissa Kohut 03/23/2021, 12:00 PM

## 2021-03-23 NOTE — Plan of Care (Signed)
  Problem: RH Balance Goal: LTG Patient will maintain dynamic standing balance (PT) Description: LTG:  Patient will maintain dynamic standing balance with assistance during mobility activities (PT) Flowsheets (Taken 03/23/2021 2339) LTG: Pt will maintain dynamic standing balance during mobility activities with:: Supervision/Verbal cueing   Problem: Sit to Stand Goal: LTG:  Patient will perform sit to stand with assistance level (PT) Description: LTG:  Patient will perform sit to stand with assistance level (PT) Flowsheets (Taken 03/23/2021 2339) LTG: PT will perform sit to stand in preparation for functional mobility with assistance level: Supervision/Verbal cueing   Problem: RH Bed Mobility Goal: LTG Patient will perform bed mobility with assist (PT) Description: LTG: Patient will perform bed mobility with assistance, with/without cues (PT). Flowsheets (Taken 03/23/2021 2339) LTG: Pt will perform bed mobility with assistance level of: Supervision/Verbal cueing   Problem: RH Bed to Chair Transfers Goal: LTG Patient will perform bed/chair transfers w/assist (PT) Description: LTG: Patient will perform bed to chair transfers with assistance (PT). Flowsheets (Taken 03/23/2021 2339) LTG: Pt will perform Bed to Chair Transfers with assistance level: Supervision/Verbal cueing   Problem: RH Car Transfers Goal: LTG Patient will perform car transfers with assist (PT) Description: LTG: Patient will perform car transfers with assistance (PT). Flowsheets (Taken 03/23/2021 2339) LTG: Pt will perform car transfers with assist:: Supervision/Verbal cueing   Problem: RH Ambulation Goal: LTG Patient will ambulate in controlled environment (PT) Description: LTG: Patient will ambulate in a controlled environment, # of feet with assistance (PT). Flowsheets (Taken 03/23/2021 2339) LTG: Pt will ambulate in controlled environ  assist needed:: Supervision/Verbal cueing LTG: Ambulation distance in controlled  environment: 150' w/ RW Goal: LTG Patient will ambulate in home environment (PT) Description: LTG: Patient will ambulate in home environment, # of feet with assistance (PT). Flowsheets (Taken 03/23/2021 2339) LTG: Pt will ambulate in home environ  assist needed:: Supervision/Verbal cueing LTG: Ambulation distance in home environment: 34' w/ RW   Problem: RH Stairs Goal: LTG Patient will ambulate up and down stairs w/assist (PT) Description: LTG: Patient will ambulate up and down # of stairs with assistance (PT) Flowsheets (Taken 03/23/2021 2339) LTG: Pt will ambulate up/down stairs assist needed:: Contact Guard/Touching assist LTG: Pt will  ambulate up and down number of stairs: 1 w/ RW for home entry and 4 w/ rail vs. rail & cane  for community access  Magda Kiel, PT

## 2021-03-23 NOTE — Progress Notes (Signed)
Inpatient Rehabilitation  Patient information reviewed and entered into eRehab system by Scottie Metayer Breasia Karges, OTR/L.   Information including medical coding, functional ability and quality indicators will be reviewed and updated through discharge.    

## 2021-03-23 NOTE — Progress Notes (Signed)
[place] PHYSICAL MEDICINE & REHABILITATION PROGRESS NOTE  Subjective/Complaints: Patient seen laying in bed this morning.  He states he slept well overnight.  He is working with therapies this morning.  Noted to be significantly orthostatic.  Placed in Trendelenburg.  We will cancel plan for shower at present.  ROS: Denies CP, SOB, N/V/D  Objective: Vital Signs: Blood pressure 132/64, pulse 65, temperature 98.3 F (36.8 C), temperature source Oral, resp. rate 18, height 5' 9.5" (1.765 m), weight 91.9 kg, SpO2 97 %. VAS [place] LOWER EXTREMITY VENOUS (DVT)  Result Date: 03/22/2021  Lower Venous DVT Study Patient Name:  Dominic Mendoza  Date of Exam:   03/22/2021 Medical Rec #: [id number]      Accession #:    [id number] Date of Birth: 1950/11/10       Patient Gender: M Patient Age:   69Y Exam Location:  [facility] Procedure:      VAS [place] LOWER EXTREMITY VENOUS (DVT) Referring Phys: [place] --------------------------------------------------------------------------------  Indications: Swelling.  Limitations: Poor ultrasound/tissue interface. Comparison Study: No prior studies. Performing Technologist: Oliver Hum RVT  Examination Guidelines: A complete evaluation includes B-mode imaging, spectral Doppler, color Doppler, and power Doppler as needed of all accessible portions of each vessel. Bilateral testing is considered an integral part of a complete examination. Limited examinations for reoccurring indications may be performed as noted. The reflux portion of the exam is performed with the patient in reverse Trendelenburg.  +---------+---------------+---------+-----------+----------+--------------+ RIGHT    CompressibilityPhasicitySpontaneityPropertiesThrombus Aging +---------+---------------+---------+-----------+----------+--------------+ CFV      Full           Yes      Yes                                  +---------+---------------+---------+-----------+----------+--------------+ SFJ      Full                                                        +---------+---------------+---------+-----------+----------+--------------+ FV Prox  Full                                                        +---------+---------------+---------+-----------+----------+--------------+ FV Mid   Full                                                        +---------+---------------+---------+-----------+----------+--------------+ FV DistalFull                                                        +---------+---------------+---------+-----------+----------+--------------+ PFV      Full                                                        +---------+---------------+---------+-----------+----------+--------------+  POP      Full           Yes      Yes                                 +---------+---------------+---------+-----------+----------+--------------+ PTV      Full                                                        +---------+---------------+---------+-----------+----------+--------------+ PERO     Full                                                        +---------+---------------+---------+-----------+----------+--------------+   +---------+---------------+---------+-----------+----------+--------------+ LEFT     CompressibilityPhasicitySpontaneityPropertiesThrombus Aging +---------+---------------+---------+-----------+----------+--------------+ CFV      Full           Yes      Yes                                 +---------+---------------+---------+-----------+----------+--------------+ SFJ      Full                                                        +---------+---------------+---------+-----------+----------+--------------+ FV Prox  Full                                                         +---------+---------------+---------+-----------+----------+--------------+ FV Mid                  Yes      Yes                                 +---------+---------------+---------+-----------+----------+--------------+ FV DistalFull                                                        +---------+---------------+---------+-----------+----------+--------------+ PFV      Full                                                        +---------+---------------+---------+-----------+----------+--------------+ POP      Full           Yes      Yes                                 +---------+---------------+---------+-----------+----------+--------------+  PTV      Full                                                        +---------+---------------+---------+-----------+----------+--------------+ PERO     Full                                                        +---------+---------------+---------+-----------+----------+--------------+     Summary: RIGHT: - There is no evidence of deep vein thrombosis in the lower extremity.  - No cystic structure found in the popliteal fossa.  LEFT: - There is no evidence of deep vein thrombosis in the lower extremity. However, portions of this examination were limited- see technologist comments above.  - No cystic structure found in the popliteal fossa.  *See table(s) above for measurements and observations. Electronically signed by Monica Martinez MD on 03/22/2021 at 5:15:34 PM.    Final    Recent Labs    03/22/21 0751 03/23/21 0418  WBC 9.1 9.0  HGB 8.0* 8.1*  HCT 24.6* 24.8*  PLT 143* 153   Recent Labs    03/22/21 0751 03/23/21 0418  NA 133* 133*  K 4.2 4.4  CL 101 101  CO2 26 25  GLUCOSE 233* 259*  BUN 24* 17  CREATININE 1.03 0.94  CALCIUM 8.6* 8.7*    Intake/Output Summary (Last 24 hours) at 03/23/2021 0912 Last data filed at 03/23/2021 0742 Gross per 24 hour  Intake 358 ml  Output 650 ml  Net -292 ml         Physical Exam: BP 132/64 (BP Location: Right Arm)   Pulse 65   Temp 98.3 F (36.8 C) (Oral)   Resp 18   Ht 5' 9.5" (1.765 m)   Wt 91.9 kg   SpO2 97%   BMI 29.49 kg/m  Constitutional: No distress . Vital signs reviewed. HENT: Normocephalic.  Atraumatic. Eyes: EOMI. No discharge.  Poor dentition. Cardiovascular: No JVD.  RRR. Respiratory: Normal effort.  No stridor.  Bilateral clear to auscultation. GI: Non-distended.  BS +. Skin: Warm and dry.   Left elbow with open ulcer and slough Left knee with abrasion Hip incision with dressing CDI Psych: Normal mood.  Normal behavior. Musc: Left elbow hip knee with tenderness.  Edema in left knee hip Neuro: Alert Normal insight and awareness.  Motor: Bilateral upper extremities, right lower extremity: 5/5 proximal distal Left lower extremity: Hip flexion, knee extension to -/5 (some pain inhibition), ankle dorsiflexion 5/5   Assessment/Plan: 1. Functional deficits which require 3+ hours per day of interdisciplinary therapy in a comprehensive inpatient rehab setting.  Physiatrist is providing close team supervision and 24 hour management of active medical problems listed below.  Physiatrist and rehab team continue to assess barriers to discharge/monitor patient progress toward functional and medical goals   Care Tool:  Bathing              Bathing assist       Upper Body Dressing/Undressing Upper body dressing        Upper body assist Assist Level: Minimal Assistance - Patient > 75%    Lower Body Dressing/Undressing Lower body dressing  Lower body assist Assist for lower body dressing: Minimal Assistance - Patient > 75%     Toileting Toileting    Toileting assist Assist for toileting: Moderate Assistance - Patient 50 - 74%     Transfers Chair/bed transfer  Transfers assist           Locomotion Ambulation   Ambulation assist              Walk 10 feet activity   Assist            Walk 50 feet activity   Assist           Walk 150 feet activity   Assist           Walk 10 feet on uneven surface  activity   Assist           Wheelchair     Assist               Wheelchair 50 feet with 2 turns activity    Assist            Wheelchair 150 feet activity     Assist           Medical Problem List and Plan: 1.Debilitysecondary to displaced left femoral neck fracture. 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 [name]. Weightbearing as tolerated  Begin CIR evaluations 2. Antithrombotics: -DVT/anticoagulation:SCDs -antiplatelet therapy: Aspirin 81 mg twice daily for DVT prophylaxis x3 weeks then aspirin 81 mg daily and Plavix 75 mg daily.  Vascular study negative for DVT 3. Pain Management:Hydrocodone/Robaxin as needed Added lidocaine patch for left rib pain  Monitor with increased exertion 4. Mood:Provide emotional support -antipsychotic agents: N/A 5. Neuropsych: This patientiscapable of making decisions on hisown behalf. 6. Skin/Wound Care:continue post-op dressing for now, no visible drainage from wound -foam dressings to protect left elbow and knee abrasions. 7. Fluids/Electrolytes/Nutrition:Routine in and outs  8. Acute blood loss anemia.   Hemoglobin 8.1 on 5/24, labs ordered for tomorrow 9. Hypertension. Norvasc 5 mg daily, Toprol-XL 25 mg daily. ACE inhibitors held due to uptrending creatinine.   With orthostasis, monitor with increased activity 10. Diabetes mellitus with hyperglycemia. Hemoglobin A1c 8.1. Lantus insulin 8 units daily. Patient on Glucotrol 5 mg twice daily and Glucophage 1000 mg twice daily as well as Actos 15 mg daily prior to admission.   Glucophage 500mg  bid  started             -add back glucotrol and actos as needed             Monitor with increased  mobility 11.History of CAD/CABG/AVR. Patient on aspirin 81 mg daily and Plavix 75 mg daily prior to admission.  12. CKD stage ?III.   Creatinine 0.94 on 5/24 13. PAD status post left SFA and stent 2016.Patient has been cleared to resume Plavix 14.  Hyponatremia  Sodium 133 on 5/24  Continue to monitor   LOS: 1 days A FACE TO FACE EVALUATION WAS PERFORMED  Talik Casique Lorie Phenix 03/23/2021, 9:12 AM

## 2021-03-23 NOTE — Progress Notes (Signed)
Physical Therapy Session Note  Patient Details  Name: Dominic Mendoza MRN: [id number] Date of Birth: Jan 03, 1951  Today's Date: 03/23/2021 PT Individual Time:1530 -1497     Short Term Goals: Week 1:  PT Short Term Goal 1 (Week 1): STG=LTG due to ELOS  Skilled Therapeutic Interventions/Progress Updates:    Patient in recliner in room with spouse present.  Patient asleep, but easily aroused.  Assisted sit to stand to RW with min A and pt reports stiffness/pain.  Ambulated with wife following with w/c to nursing station x 50'.  Patient in w/c assisted to therapy gym.  Demonstrated 1 step with RW and both forward and reverse techniques.  Patient performed sit to stand with min A and then ambulated to step performing with reverse technique with min A and mod cues.  Patient after seated rest requesting to walk more.  Ambulated from gym to room x 130' with min A to CGA and mod cues for forward gaze, stride length and walker management.  Patient in room seated EOB and wife requesting assist to change brief.  Patient sit to stand min A and wife assisting with perineal hygiene and changing brief.  Patient to supine with min A for L LE and positioning.  Left supine with call bell and needs in reach and wife in the room, bed alarm active.   Therapy Documentation Precautions:  Precautions Precautions: Fall Restrictions Weight Bearing Restrictions: No Other Position/Activity Restrictions: WBAT Pain: Pain Assessment Pain Scale: 0-10 Pain Score: 7  Pain Type: Surgical pain Pain Location: Hip Pain Orientation: Left Pain Descriptors / Indicators: Aching Pain Onset: With Activity Pain Intervention(s): Repositioned   Therapy/Group: Individual Therapy  Reginia Naas  Magda Kiel, PT 03/23/2021, 11:47 PM

## 2021-03-23 NOTE — Evaluation (Signed)
Physical Therapy Assessment and Plan  Patient Details  Name: Dominic Mendoza MRN: [id number] Date of Birth: Apr 23, 1951  PT Diagnosis: Abnormality of gait, Difficulty walking, Muscle weakness and Pain in L hip Rehab Potential: Good ELOS: 5-7 days   Today's Date: 03/23/2021 PT Individual Time: 0905-1000 PT Individual Time Calculation (min): 55 min    Hospital Problem: Principal Problem:   Left displaced femoral neck fracture (HCC) Active Problems:   Hyponatremia   Uncontrolled type 2 diabetes mellitus with hyperglycemia ([place])   Essential hypertension   Acute blood loss anemia   Postoperative pain   Orthostatic hypotension   Past Medical History:  Past Medical History:  Diagnosis Date  . Arthritis   . Cancer [facility])    prostate  . Diabetes mellitus without complication ([place])   . Dyspnea   . GERD (gastroesophageal reflux disease)   . Hypertension   . Smokers' cough [facility])    Past Surgical History:  Past Surgical History:  Procedure Laterality Date  . COLONOSCOPY    . CORONARY ARTERY BYPASS GRAFT     triple  . LYMPHADENECTOMY Bilateral 02/25/2013   Procedure: LYMPHADENECTOMY;  Surgeon: Dutch Gray, MD;  Location: [facility];  Service: Urology;  Laterality: Bilateral;  Bilateral Pelvic   . ROBOT ASSISTED LAPAROSCOPIC RADICAL PROSTATECTOMY N/A 02/25/2013   Procedure: ROBOTIC ASSISTED LAPAROSCOPIC RADICAL PROSTATECTOMY LEVEL 2;  Surgeon: Dutch Gray, MD;  Location: [facility];  Service: Urology;  Laterality: N/A;  . stent left leg    . TOTAL HIP ARTHROPLASTY Left 03/18/2021   Procedure: TOTAL HIP ARTHROPLASTY ANTERIOR APPROACH;  Surgeon: Rod Can, MD;  Location: [facility];  Service: Orthopedics;  Laterality: Left;    Assessment & Plan Clinical Impression: Dominic Mendoza is a 70 year old right-handed male with history of prostate cancerwith laparoscopic radical prostatectomy 2014,,PAD status post left SFA and stent 2016,obesity with BMI 29.92,diabetes mellitus, hypertension, CAD with  [id number] and aortic valve replacement September 2021 maintained on aspirin and Plavix, tobacco use, CKD stage III. Per chart review lives with spouse. Independent prior to admission. 1 level home 3 steps to entry. Presented 03/18/2021 after mechanical fall. No loss of conscious. Denied any chest pain or shortness of breath associated with the fall. X-rays and imaging revealed displaced left femoral neck fracture. Underwent left total hip arthroplasty anterior approach 03/18/2021 per Dr. [name].. Weightbearing as tolerated left lower extremity. Hospital course acute blood loss anemia 8.2. Currently maintained on aspirin for CVA prophylaxis. Therapy evaluations completed due to patient decreased functional mobility was admitted for a comprehensive rehab program. Patient transferred to CIR on 03/22/2021 .   Patient currently requires min with mobility secondary to muscle weakness and pain and decreased standing balance and decreased balance strategies.  Prior to hospitalization, patient was independent  with mobility and lived with Spouse,Son in a House home.  Home access is 1 in garage, 3 in front of houseStairs to enter.  Patient will benefit from skilled PT intervention to maximize safe functional mobility, minimize fall risk and decrease caregiver burden for planned discharge home with 24 hour supervision.  Anticipate patient will benefit from follow up [place] at discharge.  PT - End of Session Activity Tolerance: Tolerates 30+ min activity with multiple rests Endurance Deficit: Yes Endurance Deficit Description: limited due to pain in L hip and fatigues with activity PT Assessment Rehab Potential (ACUTE/IP ONLY): Good PT Patient demonstrates impairments in the following area(s): Balance;Pain;Safety;Endurance;Motor PT Transfers Functional Problem(s): Bed Mobility;Bed to Chair;Car;Furniture PT Locomotion Functional Problem(s): Ambulation;Stairs PT Plan PT  Intensity: Minimum of 1-2 x/day ,45  to 90 minutes PT Frequency: 5 out of 7 days PT Duration Estimated Length of Stay: 5-7 days PT Treatment/Interventions: Ambulation/gait training;Discharge planning;DME/adaptive equipment instruction;Functional mobility training;Pain management;Therapeutic Activities;UE/LE Strength taining/ROM;Wheelchair propulsion/positioning;UE/LE Coordination activities;Therapeutic Exercise;Stair training;Patient/family education;Neuromuscular re-education;Disease management/prevention;Community reintegration;Balance/vestibular training PT Transfers Anticipated Outcome(s): S PT Locomotion Anticipated Outcome(s): S w/ RW PT Recommendation Follow Up Recommendations: Home health PT Patient destination: Home Equipment Details: has RW may need shower bench   PT Evaluation Precautions/Restrictions Precautions Precautions: Fall Restrictions Weight Bearing Restrictions: No Other Position/Activity Restrictions: WBAT General   Vital Signs  Pain Pain Assessment Pain Scale: 0-10 Pain Score:  (unrated) Pain Type: Surgical pain Pain Location: Hip Pain Orientation: Left Pain Descriptors / Indicators: Aching Pain Onset: With Activity Pain Intervention(s): Rest;Distraction Home Living/Prior Functioning Home Living Family/patient expects to be discharged to:: Unsure Living Arrangements: Spouse/significant other Available Help at Discharge: Family;Available 24 hours/day Type of Home: House Home Access: Stairs to enter [employer] of Steps: 1 in garage, 3 in front of house Entrance Stairs-Rails: Right Home Layout: One level (some steps within 1 level) Bathroom Shower/Tub: Chiropodist: Standard Bathroom Accessibility: Yes  Lives With: Spouse;Son Prior Function Level of Independence: Independent with homemaking with ambulation;Independent with basic ADLs;Independent with transfers Driving: Yes Vocation: Full time employment Vocation Requirements: owns paving  company Comments: Did not use assistive device but spouse states has been unsteady Vision/Perception  Perception Perception: Within Functional Limits Praxis Praxis: Intact  Cognition Overall Cognitive Status: Within Functional Limits for tasks assessed Arousal/Alertness: Awake/alert Orientation Level: Oriented X4 Attention: Sustained;Alternating Sustained Attention: Appears intact Memory: Impaired Memory Impairment: Retrieval deficit;Decreased short term memory Decreased Short Term Memory: Verbal basic Immediate Memory Recall: Sock;Blue;Bed Memory Recall Sock: Without Cue Memory Recall Blue: Not able to recall Memory Recall Bed: Not able to recall Awareness: Appears intact Problem Solving: Appears intact Executive Function: Reasoning;Sequencing;Decision Making;Self Monitoring Reasoning: Appears intact Sequencing: Appears intact Decision Making: Appears intact Self Monitoring: Appears intact Safety/Judgment: Appears intact Sensation Sensation Light Touch: Appears Intact Hot/Cold: Appears Intact Proprioception: Appears Intact Stereognosis: Appears Intact Coordination Gross Motor Movements are Fluid and Coordinated: No Fine Motor Movements are Fluid and Coordinated: Yes Coordination and Movement Description: Limited by L hip pain Motor  Motor Motor: Within Functional Limits Motor - Skilled Clinical Observations: generalized weakness, pain   Trunk/Postural Assessment  Cervical Assessment Cervical Assessment: Within Functional Limits Thoracic Assessment Thoracic Assessment: Within Functional Limits Lumbar Assessment Lumbar Assessment: Within Functional Limits Postural Control Postural Control: Within Functional Limits  Balance Balance Balance Assessed: Yes (Simultaneous filing. User may not have seen previous data.) Static Sitting Balance Static Sitting - Balance Support: No upper extremity supported;Feet supported Static Sitting - Level of Assistance: 5: Stand by  assistance Dynamic Sitting Balance Dynamic Sitting - Balance Support: Bilateral upper extremity supported;Feet supported Dynamic Sitting - Level of Assistance: 5: Stand by assistance Dynamic Sitting - Balance Activities: Lateral lean/weight shifting;Forward lean/weight shifting;Reaching for objects;Reaching across midline Static Standing Balance Static Standing - Balance Support: Bilateral upper extremity supported Static Standing - Level of Assistance: 5: Stand by assistance Static Standing - Comment/# of Minutes: UE support on RW Dynamic Standing Balance Dynamic Standing - Balance Activities:  (pulling down/up pants) Extremity Assessment  RUE Assessment RUE Assessment: Within Functional Limits General Strength Comments: grossly assessed in functional tasks LUE Assessment LUE Assessment: Within Functional Limits General Strength Comments: grossly assessed in functional tasks RLE Assessment RLE Assessment: Exceptions to [facility] Active Range of Motion (AROM) Comments: [facility] General  Strength Comments: Hip flexion 3+/5, otherwise WFL LLE Assessment LLE Assessment: Exceptions to [facility] Passive Range of Motion (PROM) Comments: limited by pain in groin with hip flexion more than about 80 degrees Active Range of Motion (AROM) Comments: limited due to weakness in hip General Strength Comments: hip flexion 2-/5, knee extension 3+/5, ankle DF 4/5  Care Tool Care Tool Bed Mobility Roll left and right activity   Roll left and right assist level: Minimal Assistance - Patient > 75% (bed rails)    Sit to lying activity   Sit to lying assist level: Minimal Assistance - Patient > 75%    Lying to sitting edge of bed activity   Lying to sitting edge of bed assist level: Minimal Assistance - Patient > 75%     Care Tool Transfers Sit to stand transfer   Sit to stand assist level: Minimal Assistance - Patient > 75%    Chair/bed transfer   Chair/bed transfer assist level: Minimal Assistance - Patient >  75%     Toilet transfer Toilet transfer activity did not occur: Safety/medical concerns (orthostatic vitals/symptomatic)      Geneticist, molecular transfer assist level: Moderate Assistance - Patient 50 - 74%      Care Tool Locomotion Ambulation   Assist level: Minimal Assistance - Patient > 75% Assistive device: Walker-rolling Max distance: 130'  Walk 10 feet activity   Assist level: Minimal Assistance - Patient > 75% Assistive device: Walker-rolling   Walk 50 feet with 2 turns activity   Assist level: Minimal Assistance - Patient > 75% Assistive device: Walker-rolling  Walk 150 feet activity Walk 150 feet activity did not occur: Safety/medical concerns      Walk 10 feet on uneven surfaces activity Walk 10 feet on uneven surfaces activity did not occur: Safety/medical concerns      Stairs   Assist level: Minimal Assistance - Patient > 75% Stairs assistive device: Walker Max number of stairs: 1  Walk up/down 1 step activity   Walk up/down 1 step (curb) assist level: Minimal Assistance - Patient > 75% Walk up/down 1 step or curb assistive device: Walker Walk up/down 4 steps activity did not occuR: Safety/medical concerns  Walk up/down 4 steps activity      Walk up/down 12 steps activity Walk up/down 12 steps activity did not occur: Safety/medical concerns      Pick up small objects from floor Pick up small object from the floor (from standing position) activity did not occur: Safety/medical concerns      Wheelchair Will patient use wheelchair at discharge?: No Type of Wheelchair: Manual   Wheelchair assist level: Supervision/Verbal cueing Max wheelchair distance: 110'  Wheel 50 feet with 2 turns activity   Assist Level: Supervision/Verbal cueing  Wheel 150 feet activity Wheelchair 150 feet activity did not occur: Safety/medical concerns      Refer to Care Plan for Long Term Goals  SHORT TERM GOAL WEEK 1 PT Short Term Goal 1 (Week 1): STG=LTG due to  ELOS  Recommendations for other services: None   Skilled Therapeutic Intervention Patient in supine after finishing OT eval.  OT in the room and reports significant orthostatic hypotension limiting their session.  Patient in supine for LE strength measurements and performed orthostatic vitals testing: Supine: 162/63 HR 62; Sitting: 129/56 HR 69; and standing 112/86 HR 78.  Patient without complaint of symptoms in standing, though did request to urinate so assisted with urinal in standing pt performing clothing management with hands off  walker with CGA.  Patient assisted to stand step to w/c with RW and CGA . Patient assisted to hallway then propelled w/c with S and mod cues x 110' to ortho gym.  Patient sit to stand to RW with CGA and ambulated 40' with CGA, increased time, step to pattern and antalgic on L.  Patient ambulated another 55' and performed car transfer to simulated Jeep height with mod A for L LE and sit to stand.  Patient performed stand step to w/c with CGA and assisted in w/c to room.  Performed stand step to bed with RW and CGA.  Sit to supine with min A, left in supine with all needs in reach and bed alarm set.  Mobility Bed Mobility Bed Mobility: Supine to Sit;Rolling Right;Rolling Left;Scooting to [facility] Rolling Right: Minimal Assistance - Patient > 75% Rolling Left: Minimal Assistance - Patient > 75% Supine to Sit: Minimal Assistance - Patient > 75% Scooting to HOB: Moderate Assistance - Patient 50-74% Transfers Transfers: Sit to Stand;Stand to Sit Sit to Stand: Minimal Assistance - Patient > 75% Stand to Sit: Minimal Assistance - Patient > 75% Stand Pivot Transfers: Contact Guard/Touching assist Transfer (Assistive device): Rolling walker Locomotion  Gait Ambulation: Yes Gait Distance (Feet): 40 Feet Assistive device: Rolling walker Gait Gait: Yes Gait Pattern: Impaired Gait Pattern: Step-to pattern;Antalgic;Trunk flexed Stairs / Additional Locomotion Stairs:  No Architect: Yes Wheelchair Assistance: Chartered loss adjuster: Both upper extremities Wheelchair Parts Management: Needs assistance Distance: 51'   Discharge Criteria: Patient will be discharged from PT if patient refuses treatment 3 consecutive times without medical reason, if treatment goals not met, if there is a change in medical status, if patient makes no progress towards goals or if patient is discharged from hospital.  The above assessment, treatment plan, treatment alternatives and goals were discussed and mutually agreed upon: by patient  Jamison Oka, PT 03/23/2021, 12:20 PM

## 2021-03-24 DIAGNOSIS — E871 Hypo-osmolality and hyponatremia: Secondary | ICD-10-CM | POA: Diagnosis not present

## 2021-03-24 DIAGNOSIS — D62 Acute posthemorrhagic anemia: Secondary | ICD-10-CM | POA: Diagnosis not present

## 2021-03-24 DIAGNOSIS — S72002A Fracture of unspecified part of neck of left femur, initial encounter for closed fracture: Secondary | ICD-10-CM | POA: Diagnosis not present

## 2021-03-24 DIAGNOSIS — I10 Essential (primary) hypertension: Secondary | ICD-10-CM | POA: Diagnosis not present

## 2021-03-24 LAB — GLUCOSE, CAPILLARY
Glucose-Capillary: 232 mg/dL — ABNORMAL HIGH (ref 70–99)
Glucose-Capillary: 241 mg/dL — ABNORMAL HIGH (ref 70–99)
Glucose-Capillary: 181 mg/dL — ABNORMAL HIGH (ref 70–99)
Glucose-Capillary: 164 mg/dL — ABNORMAL HIGH (ref 70–99)

## 2021-03-24 MED ORDER — GLIPIZIDE 5 MG PO TABS
5.0000 mg | ORAL_TABLET | Freq: Two times a day (BID) | ORAL | Status: DC
  Administered 2021-03-24 – 2021-03-31 (×14): 5 mg via ORAL
  Filled 2021-03-24 (×14): qty 1

## 2021-03-24 NOTE — Patient Care Conference (Signed)
Inpatient RehabilitationTeam Conference and Plan of Care Update Date: 03/24/2021   Time: 11:35 AM    Patient Name: Dominic Mendoza      Medical Record Number: [id number]  Date of Birth: 10-Jun-1951 Sex: Male         Room/Bed: 4M02C/4M02C-01 Payor Info: Payor: [place] / Plan: [facility] PPO / Product Type: *No Product type* /    Admit Date/Time:  03/22/2021  1:50 PM  Primary Diagnosis:  Left displaced femoral neck fracture [facility])  Hospital Problems: Principal Problem:   Left displaced femoral neck fracture (HCC) Active Problems:   Hyponatremia   Uncontrolled type 2 diabetes mellitus with hyperglycemia (HCC)   Essential hypertension   Acute blood loss anemia   Postoperative pain   Orthostatic hypotension    Expected Discharge Date: Expected Discharge Date: 03/31/21  Team Members Present: Physician leading conference: Dr. Delice Lesch Care Coodinator Present: Dorien Chihuahua, RN, BSN, CRRN;Loralee Pacas, [place] Nurse Present: Dorien Chihuahua, RN PT Present: Magda Kiel, PT OT Present: Other (comment) Hulda Marin, OT) SLP Present: Charolett Bumpers, SLP PPS Coordinator present : Gunnar Fusi, SLP     Current Status/Progress Goal Weekly Team Focus  Bowel/Bladder   Pt is currenty cont of B/B with episodes of incont of bladder related to urgency. LBM 03/22/21  Pt to remain cont of B/B.  [place] pt q2h and PRN.   Swallow/Nutrition/ Hydration             ADL's   bed mobility mod A, sit<>stand and transfers CGA-min A, UB adls spvsn-min A, LB adls mod A  close spvsn LTGs  adl training, transfer training, family education, DME/AE, general conditioning   Mobility   min A overall gait 120' with RW increased time and cues, 1 step with RW min A, car transfer mod A, bed mobility min A.  S overall, except CGA steps  gait, balance, LE strength, activity tolerance   Communication             Safety/Cognition/ Behavioral Observations            Pain   Pt states he always  has pain ranging from 7-10 in L-hip.  Pt pain rating to a 3 or below on a 10 point pain scale.  Assess pt for qshift and provide PRN medications as needed.   Skin   Abrasion on Pt L-elbow and L-knee covered with foam. Sugrical incision and brusining on Pt L-hip covered with foam. Otherwise skin is clean and intact.  Skin to remain clean and without breakdown  Assess skin qshift and provide skin care.     Discharge Planning:  Pt lives with wife and will d/c to home with 24/7 care.   Team Discussion: Orthostasis noted early morning and urgency of bladder. CBGs fluctuating; MD adjusted medications.   Patient on target to meet rehab goals: yes, currently min assist overall. Able to transfer CGA, able to ambulate 120'  and go up/down 1 step with min assist and car transfers mod assist. Discharge goals set for supervision overall with CGA for steps.  *See Care Plan and progress notes for long and short-term goals.   Revisions to Treatment Plan:   Teaching Needs:   Current Barriers to Discharge: Decreased caregiver support and Home enviroment access/layout  Possible Resolutions to Barriers: DME training Family education    Medical Summary Current Status: Debility secondary to displaced left femoral neck fracture.  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  Swinteck.  Barriers to Discharge: Medical stability;Weight;Wound care   Possible Resolutions to Barriers/Weekly Focus: Therapies, optimize pain meds, follow labs - Hb, Na+, optimize BP/HTN meds   Continued Need for Acute Rehabilitation Level of Care: The patient requires daily medical management by a physician with specialized training in physical medicine and rehabilitation for the following reasons: Direction of a multidisciplinary physical rehabilitation program to maximize functional independence : Yes Medical management of patient stability for increased activity during participation in an  intensive rehabilitation regime.: Yes Analysis of laboratory values and/or radiology reports with any subsequent need for medication adjustment and/or medical intervention. : Yes   I attest that I was present, lead the team conference, and concur with the assessment and plan of the team.   Dorien Chihuahua B 03/24/2021, 2:41 PM

## 2021-03-24 NOTE — Care Management (Addendum)
[facility] Individual Statement of Services  Patient Name:  Dominic Mendoza  Date:  03/24/2021  Welcome to the [facility].  Our goal is to provide you with an individualized program based on your diagnosis and situation, designed to meet your specific needs.  With this comprehensive rehabilitation program, you will be expected to participate in at least 3 hours of rehabilitation therapies Monday-Friday, with modified therapy programming on the weekends.  Your rehabilitation program will include the following services:  Physical Therapy (PT), Occupational Therapy (OT), 24 hour per day rehabilitation nursing, Therapeutic Recreaction (TR), Psychology, Neuropsychology, Care Coordinator, Rehabilitation Medicine, Nutrition Services, Pharmacy Services and Other  Weekly team conferences will be held on Wednesdays to discuss your progress.  Your Inpatient Rehabilitation Care Coordinator will talk with you frequently to get your input and to update you on team discussions.  Team conferences with you and your family in attendance may also be held.  Expected length of stay: 5-7 days    Overall anticipated outcome: Supervision  Depending on your progress and recovery, your program may change. Your Inpatient Rehabilitation Care Coordinator will coordinate services and will keep you informed of any changes. Your Inpatient Rehabilitation Care Coordinator's name and contact numbers are listed  below.  The following services may also be recommended but are not provided by the [facility] will be made to provide these services after discharge if needed.  Arrangements include referral to agencies that provide these services.  Your insurance has been verified to be:  Medicare A/B  Your primary doctor is:  Antony Madura  Pertinent information will be shared with your doctor and your insurance company.  Inpatient Rehabilitation Care Coordinator:  Ovidio Kin, [place] or ([phone]  Information discussed with and copy given to patient by: Rana Snare, 03/24/2021, 10:02 AM

## 2021-03-24 NOTE — Progress Notes (Addendum)
Patient ID: Dominic Mendoza, male   DOB: 1951-09-03, 70 y.o.   MRN: [id number]  This SW covering for primary SW, [place].   SW met with pt and pt wife in room to proivde updates from team conference, and d/c date  6/1. Pt wife reports she has been here for his therapies. When discussing family edu, states she can be here tomorrow for his scheduled sessions. SW informed there will be follow-up from primary SW to discuss his d/c recommendations. At some point, she would like to return to work but does not trust him to be home by himself at this time due to his confusion while here. She feels he will do better at home. Wife has a access to TTB, and SW will order 3in1 BSC.  Loralee Pacas, MSW, [place] Office: [phone] Cell: [phone] Fax: [phone]

## 2021-03-24 NOTE — Progress Notes (Signed)
[place] PHYSICAL MEDICINE & REHABILITATION PROGRESS NOTE  Subjective/Complaints: Patient seen standing up, and later sitting in the chair working with therapy this morning.  He states he did not sleep well overnight due to having to ask for assistance for urination.[name]  He states he had a good day of therapies yesterday after he is orthostatic episode in the morning.  He has questions regarding increase in fatigue compared to prior to fall.  Discussed ambulation with therapies as well.  ROS: Denies CP, SOB, N/V/D  Objective: Vital Signs: Blood pressure (!) 156/66, pulse 64, temperature 97.7 F (36.5 C), temperature source Oral, resp. rate 16, height 5' 9.5" (1.765 m), weight 91.9 kg, SpO2 92 %. VAS [place] LOWER EXTREMITY VENOUS (DVT)  Result Date: 03/22/2021  Lower Venous DVT Study Patient Name:  Dominic Mendoza  Date of Exam:   03/22/2021 Medical Rec #: [id number]      Accession #:    [id number] Date of Birth: 1951-04-29       Patient Gender: M Patient Age:   69Y Exam Location:  [facility] Procedure:      VAS [place] LOWER EXTREMITY VENOUS (DVT) Referring Phys: Whitney --------------------------------------------------------------------------------  Indications: Swelling.  Limitations: Poor ultrasound/tissue interface. Comparison Study: No prior studies. Performing Technologist: Oliver Hum RVT  Examination Guidelines: A complete evaluation includes B-mode imaging, spectral Doppler, color Doppler, and power Doppler as needed of all accessible portions of each vessel. Bilateral testing is considered an integral part of a complete examination. Limited examinations for reoccurring indications may be performed as noted. The reflux portion of the exam is performed with the patient in reverse Trendelenburg.  +---------+---------------+---------+-----------+----------+--------------+ RIGHT    CompressibilityPhasicitySpontaneityPropertiesThrombus Aging  +---------+---------------+---------+-----------+----------+--------------+ CFV      Full           Yes      Yes                                 +---------+---------------+---------+-----------+----------+--------------+ SFJ      Full                                                        +---------+---------------+---------+-----------+----------+--------------+ FV Prox  Full                                                        +---------+---------------+---------+-----------+----------+--------------+ FV Mid   Full                                                        +---------+---------------+---------+-----------+----------+--------------+ FV DistalFull                                                        +---------+---------------+---------+-----------+----------+--------------+ PFV      Full                                                        +---------+---------------+---------+-----------+----------+--------------+  POP      Full           Yes      Yes                                 +---------+---------------+---------+-----------+----------+--------------+ PTV      Full                                                        +---------+---------------+---------+-----------+----------+--------------+ PERO     Full                                                        +---------+---------------+---------+-----------+----------+--------------+   +---------+---------------+---------+-----------+----------+--------------+ LEFT     CompressibilityPhasicitySpontaneityPropertiesThrombus Aging +---------+---------------+---------+-----------+----------+--------------+ CFV      Full           Yes      Yes                                 +---------+---------------+---------+-----------+----------+--------------+ SFJ      Full                                                         +---------+---------------+---------+-----------+----------+--------------+ FV Prox  Full                                                        +---------+---------------+---------+-----------+----------+--------------+ FV Mid                  Yes      Yes                                 +---------+---------------+---------+-----------+----------+--------------+ FV DistalFull                                                        +---------+---------------+---------+-----------+----------+--------------+ PFV      Full                                                        +---------+---------------+---------+-----------+----------+--------------+ POP      Full           Yes      Yes                                 +---------+---------------+---------+-----------+----------+--------------+  PTV      Full                                                        +---------+---------------+---------+-----------+----------+--------------+ PERO     Full                                                        +---------+---------------+---------+-----------+----------+--------------+     Summary: RIGHT: - There is no evidence of deep vein thrombosis in the lower extremity.  - No cystic structure found in the popliteal fossa.  LEFT: - There is no evidence of deep vein thrombosis in the lower extremity. However, portions of this examination were limited- see technologist comments above.  - No cystic structure found in the popliteal fossa.  *See table(s) above for measurements and observations. Electronically signed by Monica Martinez MD on 03/22/2021 at 5:15:34 PM.    Final    Recent Labs    03/22/21 0751 03/23/21 0418  WBC 9.1 9.0  HGB 8.0* 8.1*  HCT 24.6* 24.8*  PLT 143* 153   Recent Labs    03/22/21 0751 03/23/21 0418  NA 133* 133*  K 4.2 4.4  CL 101 101  CO2 26 25  GLUCOSE 233* 259*  BUN 24* 17  CREATININE 1.03 0.94  CALCIUM 8.6* 8.7*     Intake/Output Summary (Last 24 hours) at 03/24/2021 0927 Last data filed at 03/24/2021 0400 Gross per 24 hour  Intake 360 ml  Output 1600 ml  Net -1240 ml        Physical Exam: BP (!) 156/66 (BP Location: Left Arm)   Pulse 64   Temp 97.7 F (36.5 C) (Oral)   Resp 16   Ht 5' 9.5" (1.765 m)   Wt 91.9 kg   SpO2 92%   BMI 29.49 kg/m  Constitutional: No distress . Vital signs reviewed. HENT: Normocephalic.  Atraumatic.  Poor dentition. Eyes: EOMI. No discharge. Cardiovascular: No JVD.  RRR. Respiratory: Normal effort.  No stridor.  Bilateral clear to auscultation. GI: Non-distended.  BS +. Skin: Warm and dry.  Intact. Left elbow with dressing CDI Left knee with dressing CDI Hip incision Psych: Normal mood.  Normal behavior. Musc: Left elbow hip knee with tenderness, stable.   Neuro: Alert Normal insight and awareness.  Motor: Bilateral upper extremities, right lower extremity: 5/5 proximal distal Left lower extremity: Hip flexion 2/5, knee extension 3-/5 (some pain inhibition), ankle dorsiflexion 5/5   Assessment/Plan: 1. Functional deficits which require 3+ hours per day of interdisciplinary therapy in a comprehensive inpatient rehab setting.  Physiatrist is providing close team supervision and 24 hour management of active medical problems listed below.  Physiatrist and rehab team continue to assess barriers to discharge/monitor patient progress toward functional and medical goals   Care Tool:  Bathing    Body parts bathed by patient: Right arm,Left arm,Chest,Abdomen,Right upper leg,Left upper leg,Right lower leg,Face   Body parts bathed by helper: Left lower leg     Bathing assist Assist Level: Minimal Assistance - Patient > 75% (bed level; did not complete peri hygiene as he had been recently cleaned/changed)     Upper Body Dressing/Undressing  Upper body dressing   What is the patient wearing?: Pull over shirt    Upper body assist Assist Level: Minimal  Assistance - Patient > 75%    Lower Body Dressing/Undressing Lower body dressing      What is the patient wearing?: Pants     Lower body assist Assist for lower body dressing: Moderate Assistance - Patient 50 - 74%     Toileting Toileting    Toileting assist Assist for toileting: Moderate Assistance - Patient 50 - 74%     Transfers Chair/bed transfer  Transfers assist  Chair/bed transfer activity did not occur: Safety/medical concerns (orthostatic vitals/symptomatic)  Chair/bed transfer assist level: Minimal Assistance - Patient > 75%     Locomotion Ambulation   Ambulation assist      Assist level: Minimal Assistance - Patient > 75% Assistive device: Walker-rolling Max distance: 130'   Walk 10 feet activity   Assist     Assist level: Minimal Assistance - Patient > 75% Assistive device: Walker-rolling   Walk 50 feet activity   Assist    Assist level: Minimal Assistance - Patient > 75% Assistive device: Walker-rolling    Walk 150 feet activity   Assist Walk 150 feet activity did not occur: Safety/medical concerns         Walk 10 feet on uneven surface  activity   Assist Walk 10 feet on uneven surfaces activity did not occur: Safety/medical concerns         Wheelchair     Assist Will patient use wheelchair at discharge?: No Type of Wheelchair: Manual    Wheelchair assist level: Supervision/Verbal cueing Max wheelchair distance: 110'    Wheelchair 50 feet with 2 turns activity    Assist        Assist Level: Supervision/Verbal cueing   Wheelchair 150 feet activity     Assist  Wheelchair 150 feet activity did not occur: Safety/medical concerns        Medical Problem List and Plan: 1.Debilitysecondary to displaced left femoral neck fracture. 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 [name]. Weightbearing as tolerated  Continue CIR  Team conference today to discuss  current and goals and coordination of care, home and environmental barriers, and discharge planning with nursing, case manager, and therapies. Please see conference note from today as well.  2. Antithrombotics: -DVT/anticoagulation:SCDs -antiplatelet therapy: Aspirin 81 mg twice daily for DVT prophylaxis x3 weeks then aspirin 81 mg daily and Plavix 75 mg daily.  Vascular study negative for DVT 3. Pain Management:Hydrocodone/Robaxin as needed Added lidocaine patch for left rib pain  Controlled with meds on 5/25  Monitor with increased exertion 4. Mood:Provide emotional support -antipsychotic agents: N/A 5. Neuropsych: This patientiscapable of making decisions on hisown behalf. 6. Skin/Wound Care:continue post-op dressing for now, no visible drainage from wound -foam dressings to protect left elbow and knee abrasions. 7. Fluids/Electrolytes/Nutrition:Routine in and outs  8. Acute blood loss anemia.   Hemoglobin 8.1 on 5/24 9. Hypertension. Norvasc 5 mg daily, Toprol-XL 25 mg daily. ACE inhibitors held due to uptrending creatinine.   Slightly labile on 5/25, continue to monitor for now given recent orthostatic episode 10. Diabetes mellitus with hyperglycemia. Hemoglobin A1c 8.1. Lantus insulin 8 units daily. Patient on Glucotrol 5 mg twice daily and Glucophage 1000 mg twice daily as well as Actos 15 mg daily prior to admission.   Glucophage 500mg  bid  started             -add actos  as needed  Glucotrol added on 5/25             Monitor with increased mobility 11.History of CAD/CABG/AVR. Patient on aspirin 81 mg daily and Plavix 75 mg daily prior to admission.  12. CKD stage ?III.   Creatinine 0.94 on 5/24 13. PAD status post left SFA and stent 2016.Patient has been cleared to resume Plavix 14.  Hyponatremia  Sodium 133 on 5/24  Continue to monitor   LOS: 2 days A FACE TO FACE EVALUATION WAS  PERFORMED  Burt Piatek Lorie Phenix 03/24/2021, 9:27 AM

## 2021-03-24 NOTE — Progress Notes (Signed)
Occupational Therapy Session Note  Patient Details  Name: Dominic Mendoza MRN: [id number] Date of Birth: 1950-11-14  Today's Date: 03/24/2021 OT Individual Time: 5809-9833 OT Individual Time Calculation (min): 29 min    Short Term Goals: Week 1:  OT Short Term Goal 1 (Week 1): Patient will complete LB bathing/dressing min A using AE PRN. OT Short Term Goal 2 (Week 1): Patient will complete 3/3 toileting tasks with spvsn and LRAD. OT Short Term Goal 3 (Week 1): Patient will complete stand pivot transfers using LRAD with CGA. OT Short Term Goal 4 (Week 1): Patient will complete dynamic standing grooming task with LRAD with CGA.  Skilled Therapeutic Interventions/Progress Updates:    Pt in bed with family present to start session.  He was able to transfer to the EOB with mod assist for bringing the LLE off of the edge of the bed.  He then completed stand pivot transfer to the wheelchair with min assist using the RW.  He was able to work on BUE strengthening and endurance with use of the UE ergonometer.  He was able to complete two, 5 min intervals with resistance on level 7 Random Program level.  RPMs were maintained at 25 or greater throughout with HR increasing to 64 BPM and O2 at 96%.  Finished session with return to the room and transfer back to the EOB with min assist.  Mod assist for lifting his RLE into the bed to complete session.  Call button and phone in reach with family present.  Safety alarm in place as well.    Therapy Documentation Precautions:  Precautions Precautions: Fall Restrictions Weight Bearing Restrictions: No Other Position/Activity Restrictions: WBAT  Pain: Pain Assessment Pain Scale: Faces Pain Score: 8  Faces Pain Scale: Hurts a little bit Pain Type: Surgical pain Pain Location: Hip Pain Orientation: Left Pain Descriptors / Indicators: Aching;Sore Pain Onset: With Activity Pain Intervention(s): Repositioned;Rest ADL: See Care Tool Section for some details  of mobility and selfcare    Therapy/Group: Individual Therapy  Tyshia Fenter OTR/L 03/24/2021, 4:19 PM

## 2021-03-24 NOTE — Progress Notes (Signed)
Inpatient Rehabilitation Care Coordinator Assessment and Plan Patient Details  Name: Dominic Mendoza MRN: [id number] Date of Birth: Mar 25, 1951  Today's Date: 03/24/2021  Hospital Problems: Principal Problem:   Left displaced femoral neck fracture ([place]) Active Problems:   Hyponatremia   Uncontrolled type 2 diabetes mellitus with hyperglycemia ([place])   Essential hypertension   Acute blood loss anemia   Postoperative pain   Orthostatic hypotension  Past Medical History:  Past Medical History:  Diagnosis Date  . Arthritis   . Cancer [facility])    prostate  . Diabetes mellitus without complication ([place])   . Dyspnea   . GERD (gastroesophageal reflux disease)   . Hypertension   . Smokers' cough [facility])    Past Surgical History:  Past Surgical History:  Procedure Laterality Date  . COLONOSCOPY    . CORONARY ARTERY BYPASS GRAFT     triple  . LYMPHADENECTOMY Bilateral 02/25/2013   Procedure: LYMPHADENECTOMY;  Surgeon: Dutch Gray, MD;  Location: [facility];  Service: Urology;  Laterality: Bilateral;  Bilateral Pelvic   . ROBOT ASSISTED LAPAROSCOPIC RADICAL PROSTATECTOMY N/A 02/25/2013   Procedure: ROBOTIC ASSISTED LAPAROSCOPIC RADICAL PROSTATECTOMY LEVEL 2;  Surgeon: Dutch Gray, MD;  Location: [facility]S;  Service: Urology;  Laterality: N/A;  . stent left leg    . TOTAL HIP ARTHROPLASTY Left 03/18/2021   Procedure: TOTAL HIP ARTHROPLASTY ANTERIOR APPROACH;  Surgeon: Rod Can, MD;  Location: [facility];  Service: Orthopedics;  Laterality: Left;   Social History:  reports that he has quit smoking. His smoking use included cigarettes. He has a 62.00 pack-year smoking history. He has never used smokeless tobacco. He reports that he does not drink alcohol and does not use drugs.  Family / Support Systems Marital Status: Married How Long?: 25 years Patient Roles: Spouse Spouse/Significant Other: Dominic Mendoza (wife): [phone] Children: 1 adult children ages 44 and 31. Their 76 y.o. some lives in the home  and can help PRN Other Supports: None reported Anticipated Caregiver: wife Ability/Limitations of Caregiver: None reported Caregiver Availability: 24/7 Family Dynamics: Pt lives with his wife and their 81 y.o. 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 Pt expressed he was tired and was resting Recent Psychosocial Issues: Denies Psychiatric History: Denies Substance Abuse History: Denies  Patient / Family Perceptions, Expectations & Goals Pt/Family understanding of illness & functional limitations: Pt and wife have a general understanding of care needs Premorbid pt/family roles/activities: Independent Anticipated changes in roles/activities/participation: Assistance with ADLs/IADLs Pt/family expectations/goals: Pt goal is to "getting hwere I can walk ." Wife's goal it for him to "be more independent and use bathroom on his own."  [employer]: None Premorbid Home Care/DME Agencies: None Transportation available at discharge: wife Resource referrals recommended: Neuropsychology  Discharge Planning Living Arrangements: Spouse/significant other,Children Support Systems: Spouse/significant other,Children Type of Residence: Private residence Insurance underwriter Resources:  [employer] (specify) ([employer]/Colonial Web designer) Museum/gallery curator Resources: Educational psychologist (Comment) (savings) Financial Screen  Referred: No Living Expenses: Own Money Management: Patient,Spouse Does the patient have any problems obtaining your medications?: No Home Management: Wife cooks/cleans at home Patient/Family Preliminary Plans: No changes Care Coordinator Barriers to Discharge: Decreased caregiver support,Lack of/limited family support Care Coordinator Anticipated Follow Up Needs: HH/OP Expected length of stay: 5-7 days  Clinical Impression This SW covering for primary SW, Dominic Mendoza.   SW met with pt and pt wife in room to introduce self, explain role, and discuss discharge process. Pt is not a English as a second language teacher. No HCPOA. DME: RW. PCP- Dr. Antony Mendoza.  Dominic Mendoza [name] 03/24/2021, 9:59 AM

## 2021-03-24 NOTE — Progress Notes (Signed)
Patient on consult list for AD. Chaplain carried AD to patient's room and he did not know anything about an AD and said he did not request one. I asked if he wanted me to leave it and he said he would give it to his wife with the rest of the papers she had. Chaplain told him that a Chaplain will be available if he wishes to complete it.    03/24/21 1100  Clinical Encounter Type  Visited With Patient  Visit Type Initial  Referral From Nurse  Consult/Referral To Chaplain

## 2021-03-24 NOTE — Progress Notes (Signed)
Physical Therapy Session Note  Patient Details  Name: Dominic Mendoza MRN: [id number] Date of Birth: 1951-04-07  Today's Date: 03/24/2021 PT Individual Time:Session1: 8413-2440; Session2: 1330-1430 PT Individual Time Calculation (min): 60 min   Short Term Goals: Week 1:  PT Short Term Goal 1 (Week 1): STG=LTG due to ELOS  Skilled Therapeutic Interventions/Progress Updates:    Session1: Patient in supine and reports has been waiting for help to clean up due to soiled in bed with urinary incontinence.  Patient total A for hygiene and brief change in supine with NT in room to assist.  Patient supine to sit mod A.  Changed shirt with S.  Patient donned pants mod A to loop legs as put R leg in first despite cues to dress L leg first.  Patient sit to stand CGA to complete donning pants.  Seated with total A to don knee hi TEDS and min A for slip on shoes.  Patient sit to stand to RW with cues and CGA.  Used urinal in standing with min A.  Patient ambulated to sink with CGA with RW 8' and washed face, hands and brushed teeth.  Patient c/o back pain in standing, so ambulated 8' to w/c and rested in chair.  Patient ambulated in hallway x 41' with CGA with RW, but c/o "feeling weird" with bilateral shoulder pain so asked for assist to get pt's wheelchair and checked vital which were as follows. 134/83; HR 81, SpO2 99% on RA.  Patient in room stand step to recliner with RW and CGA and completed LE therex including LAQ w/ 5 sec hold, hip adductor squeezes w/ 5 sec hold, legs elevated on recliner and performed quad sets x 10 w/ 5 sec hold and hip abduction with A on L x 10.  Left seated in recliner with call bell in reach and seat belt alarm active.   [place]: Patient in recliner asleep, but easily aroused.  Spouse in the room.  Patient performed sit to sand with CGA/S.  Patient ambulated with w/c follow to ortho gym.  Seated in w/c for rest, then performed standing balance at BITS tapping circles over full screen x  3 minutes with close S to CGA with 1 vs 0 UE support.  Patient stand step to Nu Step with RW and CGA.  Performed 6 minutes on Nu Step at level 3 with UE/LE's with cues for keeping slower pace to allow for endurance. Patient transferred to mat with RW and CGA.  Sit to supine min A for L LE.  Supine for therex as noted below.  Patient increased time, pain and mod A supine to sit.  Patient sit to stand to RW with CGA and cues.  Ambulated to room with RW and son following with w/c x 110'.  Patient sit to supine with min A for L LE.  Placed ice on L hip and left with spouse and son in the room and call bell/needs in reach.   Therapy Documentation Precautions:  Precautions Precautions: Fall Restrictions Weight Bearing Restrictions: No Other Position/Activity Restrictions: WBAT Pain: Pain Assessment Pain Score: 8  Pain Type: Surgical pain Pain Location: Hip Pain Orientation: Left Pain Descriptors / Indicators: Aching;Sore Pain Onset: With Activity Pain Intervention(s): Repositioned;Rest   Exercises: Total Joint Exercises Ankle Circles/Pumps: AROM;Both;20 reps;Supine Short Arc Quad: Strengthening;10 reps;Supine;Left (5 sec hold) Heel Slides: AROM;Both;10 reps;Supine Hip ABduction/ADduction: AROM;Both;10 reps;Supine Bridges: Strengthening;Both;10 reps   Therapy/Group: Individual Therapy  Reginia Naas  [place], [place] 03/24/2021, 4:06 PM

## 2021-03-24 NOTE — Progress Notes (Signed)
Occupational Therapy Session Note  Patient Details  Name: Dominic Mendoza MRN: [id number] Date of Birth: Jul 05, 1951  Today's Date: 03/24/2021 OT Individual Time: 1000-1055 OT Individual Time Calculation (min): 55 min    Short Term Goals: Week 1:  OT Short Term Goal 1 (Week 1): Patient will complete LB bathing/dressing min A using AE PRN. OT Short Term Goal 2 (Week 1): Patient will complete 3/3 toileting tasks with spvsn and LRAD. OT Short Term Goal 3 (Week 1): Patient will complete stand pivot transfers using LRAD with CGA. OT Short Term Goal 4 (Week 1): Patient will complete dynamic standing grooming task with LRAD with CGA.  Skilled Therapeutic Interventions/Progress Updates:    Pt resting in bed upon arrival. Pt declined bathing/dressing stating he "already did that." Supine>sit EOB with supervision. Sit>supine with min A to assist with LLE. Pt required min A for donning shoes. Pt amb with RW to bathroom and returned to room. Sit<>stand X 5. Amb in room with supervision. PT c/o increased fatigue when returning to bed from bathroom. Pt returned to supine. Pt required multiple rest breaks during session and c/o increased fatigue. Pt remained in bed with all needs within reach and bed alarm activated.   Therapy Documentation Precautions:  Precautions Precautions: Fall Restrictions Weight Bearing Restrictions: No Other Position/Activity Restrictions: WBAT  Pain:  Pt states his pain is "ok" but c/o increased pain in LLE during bed mobility; repositoined     Therapy/Group: Individual Therapy  Leroy Libman 03/24/2021, 11:01 AM

## 2021-03-25 DIAGNOSIS — I10 Essential (primary) hypertension: Secondary | ICD-10-CM

## 2021-03-25 DIAGNOSIS — I951 Orthostatic hypotension: Secondary | ICD-10-CM

## 2021-03-25 DIAGNOSIS — D62 Acute posthemorrhagic anemia: Secondary | ICD-10-CM

## 2021-03-25 DIAGNOSIS — S72002A Fracture of unspecified part of neck of left femur, initial encounter for closed fracture: Secondary | ICD-10-CM

## 2021-03-25 DIAGNOSIS — E871 Hypo-osmolality and hyponatremia: Secondary | ICD-10-CM

## 2021-03-25 DIAGNOSIS — E1165 Type 2 diabetes mellitus with hyperglycemia: Secondary | ICD-10-CM

## 2021-03-25 DIAGNOSIS — G8918 Other acute postprocedural pain: Secondary | ICD-10-CM

## 2021-03-25 LAB — GLUCOSE, CAPILLARY
Glucose-Capillary: 182 mg/dL — ABNORMAL HIGH (ref 70–99)
Glucose-Capillary: 187 mg/dL — ABNORMAL HIGH (ref 70–99)
Glucose-Capillary: 143 mg/dL — ABNORMAL HIGH (ref 70–99)
Glucose-Capillary: 191 mg/dL — ABNORMAL HIGH (ref 70–99)

## 2021-03-25 NOTE — Progress Notes (Signed)
Occupational Therapy Session Note  Patient Details  Name: Dominic Mendoza MRN: [id number] Date of Birth: 1951/10/29  Today's Date: 03/25/2021 OT Individual Time: 1700-1749 OT Individual Time Calculation (min): 67 min    Short Term Goals: Week 1:  OT Short Term Goal 1 (Week 1): Patient will complete LB bathing/dressing min A using AE PRN. OT Short Term Goal 2 (Week 1): Patient will complete 3/3 toileting tasks with spvsn and LRAD. OT Short Term Goal 3 (Week 1): Patient will complete stand pivot transfers using LRAD with CGA. OT Short Term Goal 4 (Week 1): Patient will complete dynamic standing grooming task with LRAD with CGA.  Skilled Therapeutic Interventions/Progress Updates:    Pt received supine in bed with wife present, agreeable to OT session. Wife reports that she stayed overnight with patient as he was confused and did not sleep well.  Sup<>sit min A using bed rails and vc's for body mechanics; education on hooking L heel to bring to EOB. Sit<>stand CGA with RW using safe hand placement. Pt ambulated bed<>bathroom with RW at CGA and decreased speed due to unrated pain in L hip. Pt engaged in shower on TTB with min A and CGA in stance to wash buttocks; mod vc's required to complete task and pt provided with long handled sponge. UB dressing set up seated in w/c and LB dressing min A seated and in stance using reacher for assistance and vc's for donning L leg first. Pt donned/doffed nonskid grip socks mod A intermittently using AE (reacher/sock aid) with vc's for technique. Pt and wife education on fall and home safety, assistance levels, and d/c recommendations including AE/DME; demo'd good understanding but will benefit from reinforcement. Pt reported feeling slightly nauseated following shower but otherwise nonsymptomatic. Pt left supine in bed, alarm set, call bell in reach, wife present and all needs met.  Therapy Documentation Precautions:  Precautions Precautions:  Fall Restrictions Weight Bearing Restrictions: No Other Position/Activity Restrictions: WBAT Pain: Pain Assessment Pain Scale: 0-10 Pain Score:  (unrated) Pain Type: Surgical pain Pain Location: Hip Pain Orientation: Left Pain Descriptors / Indicators: Aching;Sore;Tender Pain Onset: With Activity Pain Intervention(s): Repositioned;Rest   Therapy/Group: Individual Therapy  Mellissa Kohut 03/25/2021, 12:34 PM

## 2021-03-25 NOTE — Progress Notes (Addendum)
Physical Therapy Session Note  Patient Details  Name: Dominic Mendoza MRN: [id number] Date of Birth: 08-11-1951  Today's Date: 03/25/2021 PT Individual Time:Session1: 8841-6606; Arthor Captain: [id number]  PT Individual Time Calculation (min): 73 min & 57 min  Short Term Goals: Week 1:  PT Short Term Goal 1 (Week 1): STG=LTG due to ELOS  Skilled Therapeutic Interventions/Progress Updates:    Session1:  Patient in supine and reports not sleeping well.  States his hip is more sore today.  In supine for therex as noted below.  Supine to sit with mod cues and min to mod  A for L LE and trunk.  Patient relates did sleep in hospital bed so likely will be able to use HOB elevation to assist with bed mobility at d/c.    Patient sit to stand CGA and ambulated to bathroom with RW and CGA.  Standing to urinate at toilet with CGA.  Patient ambulated to w/c and propelled toward  therapy gym in w/c x 115' then assisted remainder of distance due to UE fatigue.  In parallel bars stepping forward over hockey sticks for step length, step through pattern x 6 reps.  Patient ambulated through floor ladder in bars for promoting step through pattern x 6 reps.  Ambulated with RW and CGA x 50' with min cues for step through pattern.  Patient performed TUG as noted below.  Patient ambulated 79' with RW and CGA and negotiated 6" curb step with RW forward technique with CGA.  Patient c/o feeling dizzy so obtained w/c for pt to sit.  Pt reports turning head too fast.  Assisted in w/c to room and pt stand step to recliner with RW and CGA.  Left in recliner with alarm belt active and call bell/needs in reach.  Session2:  Patient in recliner and reports feels his hip may "bust".  States did get pain medication from RN.  Sit to stand with CGA and patient ambulated to ortho gym x 110' with RW and CGA step through technique.  Seated on Nu Step x 6 min for UE/LE at level 3.  Standing at [facility] with RW for intermittent UE support touching letters  A-Z with min cues took 2:30.  Patient performed car transfer with min A for L LE to simulated Jeep height.  Patient negotiated ramp with RW and CGA to S.  Patient assisted in w/c to room.  Ambulated around bed and performed sit to supine with CGA.  Left in supine with call bell in reach and bed alarm active.  Therapy Documentation Precautions:  Precautions Precautions: Fall Restrictions Weight Bearing Restrictions: No Other Position/Activity Restrictions: WBAT Pain: Pain Assessment Pain Score: 8  Pain Type: Surgical pain Pain Location: Hip Pain Orientation: Left Pain Descriptors / Indicators: Aching;Sore;Tender Pain Onset: With Activity Pain Intervention(s): Repositioned;Rest Balance: Standardized Balance Assessment Standardized Balance Assessment: Timed Up and Go Test Timed Up and Go Test TUG: Normal TUG Normal TUG (seconds): 47.6 Exercises: Total Joint Exercises Ankle Circles/Pumps: AROM;Both;20 reps;Supine Towel Squeeze: Strengthening;Both;10 reps;Supine (5 sec hold) Short Arc Quad: Strengthening;10 reps;Supine;Left (5 sec hold) Heel Slides: AROM;Both;10 reps;Supine Hip ABduction/ADduction: AROM;Both;10 reps;Supine Bridges: Strengthening;Both;10 reps  Therapy/Group: Individual Therapy  Reginia Naas  Domino, PT  03/25/2021, 10:50 AM

## 2021-03-25 NOTE — Progress Notes (Signed)
Patient ID: Dominic Mendoza, male   DOB: 07/08/1951, 70 y.o.   MRN: [id number]  SW called pt wife Olivia Mackie to inform on resolved issues with insurance where Medicare is primary and [employer] is secondary.   Loralee Pacas, MSW, [place] Office: [phone] Cell: [phone] Fax: [phone]

## 2021-03-25 NOTE — Progress Notes (Addendum)
[place] PHYSICAL MEDICINE & REHABILITATION PROGRESS NOTE  Subjective/Complaints: Patient seen standing up, ambulating, and later sitting down with therapies.  He states he slept fairly well overnight.  Wife at bedside with questions regarding overnight confusion as well as DC IV.  ROS: Denies eyes CP, SOB, N/V/D  Objective: Vital Signs: Blood pressure (!) 141/64, pulse 71, temperature 97.9 F (36.6 C), resp. rate 18, height 5' 9.5" (1.765 m), weight 91.9 kg, SpO2 97 %. No results found. Recent Labs    03/23/21 0418  WBC 9.0  HGB 8.1*  HCT 24.8*  PLT 153   Recent Labs    03/23/21 0418  NA 133*  K 4.4  CL 101  CO2 25  GLUCOSE 259*  BUN 17  CREATININE 0.94  CALCIUM 8.7*    Intake/Output Summary (Last 24 hours) at 03/25/2021 0837 Last data filed at 03/25/2021 0741 Gross per 24 hour  Intake 700 ml  Output 1050 ml  Net -350 ml        Physical Exam: BP (!) 141/64 (BP Location: Right Arm)   Pulse 71   Temp 97.9 F (36.6 C)   Resp 18   Ht 5' 9.5" (1.765 m)   Wt 91.9 kg   SpO2 97%   BMI 29.49 kg/m  Constitutional: No distress . Vital signs reviewed. HENT: Normocephalic.  Atraumatic.  Poor dentition. Eyes: EOMI. No discharge. Cardiovascular: No JVD.  RRR.  + Murmur  Respiratory: Normal effort.  No stridor.  Bilateral clear to auscultation. GI: Non-distended.  BS +. Skin: Warm and dry.   Left elbow with dressing CDI Left knee abrasion healing Left hip incision Psych: Normal mood.  Normal behavior. Musc: Left elbow hip knee with tenderness, improving Neuro: Alert Normal insight and awareness.  Motor: Bilateral upper extremities, right lower extremity: 5/5 proximal distal Left lower extremity: Hip flexion 2/5, knee extension 3-/5 (some pain inhibition), ankle dorsiflexion 5/5, unchanged  Assessment/Plan: 1. Functional deficits which require 3+ hours per day of interdisciplinary therapy in a comprehensive inpatient rehab setting.  Physiatrist is providing  close team supervision and 24 hour management of active medical problems listed below.  Physiatrist and rehab team continue to assess barriers to discharge/monitor patient progress toward functional and medical goals   Care Tool:  Bathing    Body parts bathed by patient: Right arm,Left arm,Chest,Abdomen,Right upper leg,Left upper leg,Right lower leg,Face   Body parts bathed by helper: Left lower leg     Bathing assist Assist Level: Minimal Assistance - Patient > 75% (bed level; did not complete peri hygiene as he had been recently cleaned/changed)     Upper Body Dressing/Undressing Upper body dressing   What is the patient wearing?: Pull over shirt    Upper body assist Assist Level: Minimal Assistance - Patient > 75%    Lower Body Dressing/Undressing Lower body dressing      What is the patient wearing?: Underwear/pull up     Lower body assist Assist for lower body dressing: Moderate Assistance - Patient 50 - 74%     Toileting Toileting    Toileting assist Assist for toileting: Minimal Assistance - Patient > 75%     Transfers Chair/bed transfer  Transfers assist  Chair/bed transfer activity did not occur: Safety/medical concerns (orthostatic vitals/symptomatic)  Chair/bed transfer assist level: Contact Guard/Touching assist     Locomotion Ambulation   Ambulation assist      Assist level: Contact Guard/Touching assist Assistive device: Walker-rolling Max distance: 110'   Walk 10 feet activity  Assist     Assist level: Contact Guard/Touching assist Assistive device: Walker-rolling   Walk 50 feet activity   Assist    Assist level: Contact Guard/Touching assist Assistive device: Walker-rolling    Walk 150 feet activity   Assist Walk 150 feet activity did not occur: Safety/medical concerns         Walk 10 feet on uneven surface  activity   Assist Walk 10 feet on uneven surfaces activity did not occur: Safety/medical concerns          Wheelchair     Assist Will patient use wheelchair at discharge?: No Type of Wheelchair: Manual    Wheelchair assist level: Supervision/Verbal cueing Max wheelchair distance: 110'    Wheelchair 50 feet with 2 turns activity    Assist        Assist Level: Supervision/Verbal cueing   Wheelchair 150 feet activity     Assist  Wheelchair 150 feet activity did not occur: Safety/medical concerns        Medical Problem List and Plan: 1.Debilitysecondary to displaced left femoral neck fracture. 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 [name]. Weightbearing as tolerated  Continue CIR 2. Antithrombotics: -DVT/anticoagulation:SCDs -antiplatelet therapy: Aspirin 81 mg twice daily for DVT prophylaxis x3 weeks then aspirin 81 mg daily and Plavix 75 mg daily.  Vascular study negative for DVT 3. Pain Management:Hydrocodone/Robaxin as needed Added lidocaine patch for left rib pain  Controlled with meds on 5/26  Monitor with increased exertion 4. Mood:Provide emotional support -antipsychotic agents: N/A 5. Neuropsych: This patientiscapable of making decisions on hisown behalf. 6. Skin/Wound Care:continue post-op dressing for now, no visible drainage from wound -foam dressings to protect left elbow and knee abrasions. 7. Fluids/Electrolytes/Nutrition:Routine in and outs  8. Acute blood loss anemia.   Hemoglobin 8.1 on 5/24, labs ordered for tomorrow 9. Hypertension. Norvasc 5 mg daily, Toprol-XL 25 mg daily. ACE inhibitors held due to uptrending creatinine.   Relatively controlled on 5/26 10. Diabetes mellitus with hyperglycemia. Hemoglobin A1c 8.1. Lantus insulin 8 units daily. Patient on Glucotrol 5 mg twice daily and Glucophage 1000 mg twice daily as well as Actos 15 mg daily prior to admission.   Glucophage 500mg  bid  started             -add actos  as needed  Glucotrol added on 5/25  Remains elevated on 5/26, will not make further medication adjustments today given recent increase             Monitor with increased mobility 11.History of CAD/CABG/AVR. Patient on aspirin 81 mg daily and Plavix 75 mg daily prior to admission.  12. CKD stage ?III.   Creatinine 0.94 on 5/24, labs ordered for tomorrow 13. PAD status post left SFA and stent 2016.Patient has been cleared to resume Plavix 14.  Hyponatremia  Sodium 133 on 5/24, labs ordered for tomorrow  Continue to monitor   LOS: 3 days A FACE TO FACE EVALUATION WAS PERFORMED  Giada Schoppe Lorie Phenix 03/25/2021, 8:37 AM

## 2021-03-26 ENCOUNTER — Inpatient Hospital Stay (HOSPITAL_COMMUNITY): Payer: Medicare Other

## 2021-03-26 LAB — GLUCOSE, CAPILLARY
Glucose-Capillary: 158 mg/dL — ABNORMAL HIGH (ref 70–99)
Glucose-Capillary: 169 mg/dL — ABNORMAL HIGH (ref 70–99)
Glucose-Capillary: 129 mg/dL — ABNORMAL HIGH (ref 70–99)
Glucose-Capillary: 134 mg/dL — ABNORMAL HIGH (ref 70–99)

## 2021-03-26 LAB — CBC WITH DIFFERENTIAL/PLATELET
Abs Immature Granulocytes: 0.33 10*3/uL — ABNORMAL HIGH (ref 0.00–0.07)
Basophils Absolute: 0 10*3/uL (ref 0.0–0.1)
Basophils Relative: 0 %
Eosinophils Absolute: 0.4 10*3/uL (ref 0.0–0.5)
Eosinophils Relative: 5 %
HCT: 24.2 % — ABNORMAL LOW (ref 39.0–52.0)
Hemoglobin: 7.9 g/dL — ABNORMAL LOW (ref 13.0–17.0)
Immature Granulocytes: 4 %
Lymphocytes Relative: 21 %
Lymphs Abs: 1.8 10*3/uL (ref 0.7–4.0)
MCH: 29.7 pg (ref 26.0–34.0)
MCHC: 32.6 g/dL (ref 30.0–36.0)
MCV: 91 fL (ref 80.0–100.0)
Monocytes Absolute: 0.7 10*3/uL (ref 0.1–1.0)
Monocytes Relative: 8 %
Neutro Abs: 5.4 10*3/uL (ref 1.7–7.7)
Neutrophils Relative %: 62 %
Platelets: 155 10*3/uL (ref 150–400)
RBC: 2.66 MIL/uL — ABNORMAL LOW (ref 4.22–5.81)
RDW: 14.9 % (ref 11.5–15.5)
WBC: 8.7 10*3/uL (ref 4.0–10.5)
nRBC: 0 % (ref 0.0–0.2)

## 2021-03-26 LAB — URINALYSIS, COMPLETE (UACMP) WITH MICROSCOPIC
Bacteria, UA: NONE SEEN
Bilirubin Urine: NEGATIVE
Glucose, UA: NEGATIVE mg/dL
Hgb urine dipstick: NEGATIVE
Ketones, ur: NEGATIVE mg/dL
Leukocytes,Ua: NEGATIVE
Nitrite: NEGATIVE
Protein, ur: NEGATIVE mg/dL
Specific Gravity, Urine: 1.019 (ref 1.005–1.030)
pH: 5 (ref 5.0–8.0)

## 2021-03-26 LAB — BASIC METABOLIC PANEL
Anion gap: 6 (ref 5–15)
BUN: 14 mg/dL (ref 8–23)
CO2: 28 mmol/L (ref 22–32)
Calcium: 8.7 mg/dL — ABNORMAL LOW (ref 8.9–10.3)
Chloride: 100 mmol/L (ref 98–111)
Creatinine, Ser: 1.06 mg/dL (ref 0.61–1.24)
GFR, Estimated: >60 mL/min (ref >60)
Glucose, Bld: 160 mg/dL — ABNORMAL HIGH (ref 70–99)
Potassium: 4.5 mmol/L (ref 3.5–5.1)
Sodium: 134 mmol/L — ABNORMAL LOW (ref 135–145)

## 2021-03-26 MED ORDER — TRAZODONE HCL 50 MG PO TABS
50.0000 mg | ORAL_TABLET | Freq: Every day | ORAL | Status: DC
  Administered 2021-03-26 – 2021-03-30 (×5): 50 mg via ORAL
  Filled 2021-03-26 (×5): qty 1

## 2021-03-26 MED ORDER — MELATONIN 3 MG PO TABS
1.5000 mg | ORAL_TABLET | Freq: Every day | ORAL | Status: DC
  Administered 2021-03-26 – 2021-03-30 (×5): 1.5 mg via ORAL
  Filled 2021-03-26 (×5): qty 1

## 2021-03-26 MED ORDER — METFORMIN HCL 500 MG PO TABS
1000.0000 mg | ORAL_TABLET | Freq: Two times a day (BID) | ORAL | Status: DC
  Administered 2021-03-26 – 2021-03-31 (×11): 1000 mg via ORAL
  Filled 2021-03-26 (×10): qty 2

## 2021-03-26 NOTE — Progress Notes (Signed)
Occupational Therapy Session Note  Patient Details  Name: Dominic Mendoza MRN: [id number] Date of Birth: May 26, 1951  Today's Date: 03/26/2021 OT Individual Time: 1300-1400 OT Individual Time Calculation (min): 60 min    Short Term Goals: Week 1:  OT Short Term Goal 1 (Week 1): Patient will complete LB bathing/dressing min A using AE PRN. OT Short Term Goal 2 (Week 1): Patient will complete 3/3 toileting tasks with spvsn and LRAD. OT Short Term Goal 3 (Week 1): Patient will complete stand pivot transfers using LRAD with CGA. OT Short Term Goal 4 (Week 1): Patient will complete dynamic standing grooming task with LRAD with CGA.  Skilled Therapeutic Interventions/Progress Updates:    Pt received seated in w/c finishing lunch, declining ADLs and agreeable to OT session with wife present throughout session. Pt reporting increased and acute pain in L hip following PT session and ambulation; had recently been given pain meds per pt report. Pt practiced tub shower transfers to simulate home environment with TTB; sit<>stand CGA with RW and vc's for hand placement, stand pivot transfer w/c<>TTB CGA; vc's required for sequencing transfer. Pt requiring min A to lift LLE over tub threshold and reporting increased pain. Pt agreeable to UE tasks in gym. BUE conditioning and strengthening exercises to promote increased independence in functional mobility and ADLs included tossing horseshoes, dowel rod volley ball, and dowel rod exercises using 2 and 5 lb (bicep curls, overhead press, and rowing). Pt reported increased R shoulder pain and demo'd difficulties motor planning some exercises despite demos and HOHA. Rest breaks provided throughout due to fatigue. Pt engaged in dynamic sitting balance tasks involving BITS targeting recall and visual scanning involving reaching outside BOS with back unsupported. Pt demo'd difficulties with short term memory and required increased vc's and instructional cues to complete task;  able to recall list of 4 items in correct sequence without errors. Sit>supine min A for managing BUEs due to pain. Pt remained supine, alarm set, call bell in reach, and all needs met.   Therapy Documentation Precautions:  Precautions Precautions: Fall Restrictions Weight Bearing Restrictions: No Other Position/Activity Restrictions: WBAT   Pain: Pain Assessment Pain Scale: 0-10 Pain Score: 8  Pain Type: Surgical pain Pain Location: Hip Pain Orientation: Left Pain Descriptors / Indicators: Aching Pain Onset: With Activity Pain Intervention(s): Cold applied;Rest;Distraction   Therapy/Group: Individual Therapy  Mellissa Kohut 03/26/2021, 4:07 PM

## 2021-03-26 NOTE — Progress Notes (Signed)
Physical Therapy Session Note  Patient Details  Name: Dominic Mendoza MRN: [id number] Date of Birth: 02/19/1951  Today's Date: 03/26/2021 PT Individual Time: 0902-0957 PT Individual Time Calculation (min): 55 min   Short Term Goals: Week 1:  PT Short Term Goal 1 (Week 1): STG=LTG due to ELOS  Skilled Therapeutic Interventions/Progress Updates:    Patient in supine and reports had tornado drill so did not eat much.  Slept a little better.  Needs to use the bathroom.  Applied knee hi TEDS in supine.  Supine to sit with elevated HOB and min A for L LE to initiate movement to EOB.  Patient with cough and congestion.  RN in to deliver medication after called for meds for pain and informed of congestion.  Patient sit to stand with CGA and ambulated to bathroom with RW and CA after donning shoes.  PAtient standing to toilet with CGA and mild posterior sway.  Ambulated to EOB and doffed soiled briefs.  Patient standing to perform toilet hygiene with wash cloth and CGA for balance.  Patient pulled up briefs and shorts with min A.  Ambulated to sink with RW and performed oral hygiene and washed hands with close S to CGA.  Patient c/o back pain so cues for tall posture to rest back with UE support on RW.  Patient ambulated to w/c and assisted in w/c to therapy gym.  Patient sit to stand to initiate ambulation to stairs, but c/o not feeling well.  Checked BP as noted below and RN made aware.  Patient assisted back to room.  Stand step to recliner with CGA and RW.  Reclined and positioned for comfort with blankets due to c/o "chill".  RN aware.  Left with telesitter in the room and seat belt alarm active.  Therapy Documentation Precautions:  Precautions Precautions: Fall Restrictions Weight Bearing Restrictions: No Other Position/Activity Restrictions: WBAT Vital Signs:  Orthostatic VS for the past 24 hrs (Last 3 readings):  BP- Sitting Pulse- Sitting BP- Standing at 0 minutes Pulse- Standing at 0 minutes   03/26/21 1000 120/79 78 (!) 81/70 77   Pain: Pain Assessment Pain Scale: 0-10 Pain Score: 8  Pain Type: Surgical pain Pain Location: Hip Pain Orientation: Left Pain Descriptors / Indicators: Aching;Tightness Pain Frequency: Intermittent Pain Onset: With Activity Pain Intervention(s): RN made aware   Therapy/Group: Individual Therapy  Reginia Naas  Magda Kiel, PT 03/26/2021, 10:19 AM

## 2021-03-26 NOTE — Progress Notes (Signed)
Physical Therapy Session Note  Patient Details  Name: Dominic Mendoza MRN: [id number] Date of Birth: 08-Oct-1951  Today's Date: 03/26/2021 PT Individual Time: 1120-1205 PT Individual Time Calculation (min): 45 min   Short Term Goals: Week 1:  PT Short Term Goal 1 (Week 1): STG=LTG due to ELOS Week 2:    Week 3:     Skilled Therapeutic Interventions/Progress Updates:    Pain:  Pt reports L thigh pain, did not quantify.  Treatment to tolerance.  Rest breaks and repositioning as needed.  Pt initially oob in recliner and agreeable to treatment session.  Wife present and requested to attend session. Sit to stand from recliner w/CGA to RW.  Short distance gait to wc, turn/sit to wc w/cga. Pt transported to gym stand pivot transfer to mat w/RW, cga. In sitting perfomed LLE heel raises x 20, knee flexions/extension w/foot on towel x 20 Sit to supine w/mod assist. In supine performed the following: TKEs x 20 Hip ABD/ADD AAROM x 20 Bridging partial lift x 20 Clamshells x 20 Supine to sit w/mod assist. \repeated Sit to stand without AD w/cga x 4 Standing no AD cervical rotation/nods/arm raises/double arm raises all w/CGA Gait 7ft w/RW, step to to step thru w/cga, wife followed w/wc. Pt left oob in wc w/alarm belt set and needs in reach  Therapy Documentation Precautions:  Precautions Precautions: Fall Restrictions Weight Bearing Restrictions: No Other Position/Activity Restrictions: WBAT    Therapy/Group: Individual Therapy  Callie Fielding, [place] 03/26/2021, 12:36 PM

## 2021-03-26 NOTE — Progress Notes (Signed)
Pt calling out repositioned per pt request. Pt checked for incontinence and offered toilet/urinal. Denied need for toileting. Writer left room bed alarm rang within seconds of leaving room pt found OOB by  The end of bed , pt confused wants to see his dogs insisted on going home. Pt back to bed bed alarm on will place order for tele-sitter

## 2021-03-26 NOTE — Progress Notes (Signed)
[facility] PHYSICAL MEDICINE & REHABILITATION PROGRESS NOTE  Subjective/Complaints: Patient seen sitting up in bed this morning.  He states he slept well overnight.  Per nursing, patient noted to be restless overnight and confused.  ROS: Denies CP, SOB, N/V/D  Objective: Vital Signs: Blood pressure (!) 159/71, pulse 63, temperature 97.6 F (36.4 C), resp. rate 20, height 5' 9.5" (1.765 m), weight 91.9 kg, SpO2 98 %. No results found. Recent Labs    03/26/21 0520  WBC 8.7  HGB 7.9*  HCT 24.2*  PLT 155   Recent Labs    03/26/21 0520  NA 134*  K 4.5  CL 100  CO2 28  GLUCOSE 160*  BUN 14  CREATININE 1.06  CALCIUM 8.7*    Intake/Output Summary (Last 24 hours) at 03/26/2021 0754 Last data filed at 03/26/2021 0747 Gross per 24 hour  Intake 720 ml  Output 1200 ml  Net -480 ml        Physical Exam: BP (!) 159/71 (BP Location: Right Arm)   Pulse 63   Temp 97.6 F (36.4 C)   Resp 20   Ht 5' 9.5" (1.765 m)   Wt 91.9 kg   SpO2 98%   BMI 29.49 kg/m  Constitutional: No distress . Vital signs reviewed. HENT: Normocephalic.  Atraumatic.  Poor dentition. Eyes: EOMI. No discharge. Cardiovascular: No JVD.  RRR.  + Murmur. Respiratory: Normal effort.  No stridor.  Bilateral clear to auscultation. GI: Non-distended.  BS +. Skin: Warm and dry.   Left elbow with dressing CDI Left knee abrasion healing Left hip incision with staples CDI Psych: Normal mood.  Normal behavior. Musc: Left elbow hip knee with tenderness, improving Neuro: Alert Normal insight and awareness.  Motor: Bilateral upper extremities, right lower extremity: 5/5 proximal distal Left lower extremity: Hip flexion 2/5, knee extension 3-/5 (some pain inhibition), ankle dorsiflexion 5/5, improving  Assessment/Plan: 1. Functional deficits which require 3+ hours per day of interdisciplinary therapy in a comprehensive inpatient rehab setting.  Physiatrist is providing close team supervision and 24 hour  management of active medical problems listed below.  Physiatrist and rehab team continue to assess barriers to discharge/monitor patient progress toward functional and medical goals   Care Tool:  Bathing    Body parts bathed by patient: Right arm,Left arm,Chest,Abdomen,Right upper leg,Left upper leg,Right lower leg,Face,Front perineal area,Buttocks,Left lower leg   Body parts bathed by helper: Left lower leg     Bathing assist Assist Level: Minimal Assistance - Patient > 75%     Upper Body Dressing/Undressing Upper body dressing   What is the patient wearing?: Pull over shirt    Upper body assist Assist Level: Minimal Assistance - Patient > 75%    Lower Body Dressing/Undressing Lower body dressing      What is the patient wearing?: Underwear/pull up,Pants     Lower body assist Assist for lower body dressing: Minimal Assistance - Patient > 75%     Toileting Toileting    Toileting assist Assist for toileting: Minimal Assistance - Patient > 75%     Transfers Chair/bed transfer  Transfers assist  Chair/bed transfer activity did not occur: Safety/medical concerns (orthostatic vitals/symptomatic)  Chair/bed transfer assist level: Contact Guard/Touching assist     Locomotion Ambulation   Ambulation assist      Assist level: Contact Guard/Touching assist Assistive device: Walker-rolling Max distance: 110'   Walk 10 feet activity   Assist     Assist level: Contact Guard/Touching assist Assistive device: Walker-rolling  Walk 50 feet activity   Assist    Assist level: Contact Guard/Touching assist Assistive device: Walker-rolling    Walk 150 feet activity   Assist Walk 150 feet activity did not occur: Safety/medical concerns         Walk 10 feet on uneven surface  activity   Assist Walk 10 feet on uneven surfaces activity did not occur: Safety/medical concerns   Assist level: Contact Guard/Touching assist Assistive device:  Aeronautical engineer Will patient use wheelchair at discharge?: No Type of Wheelchair: Manual    Wheelchair assist level: Supervision/Verbal cueing Max wheelchair distance: 115'    Wheelchair 50 feet with 2 turns activity    Assist        Assist Level: Supervision/Verbal cueing   Wheelchair 150 feet activity     Assist  Wheelchair 150 feet activity did not occur: Safety/medical concerns        Medical Problem List and Plan: 1.Debilitysecondary to displaced left femoral neck fracture. 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 [name]. Weightbearing as tolerated  Continue CIR 2. Antithrombotics: -DVT/anticoagulation:SCDs -antiplatelet therapy: Aspirin 81 mg twice daily for DVT prophylaxis x3 weeks then aspirin 81 mg daily and Plavix 75 mg daily.  Vascular study negative for DVT 3. Pain Management:Hydrocodone/Robaxin as needed Added lidocaine patch for left rib pain  Controlled with meds on 5/27  Monitor with increased exertion 4. Mood:Provide emotional support -antipsychotic agents: N/A 5. Neuropsych: This patientiscapable of making decisions on hisown behalf. 6. Skin/Wound Care:continue post-op dressing for now, no visible drainage from wound -foam dressings to protect left elbow and knee abrasions. 7. Fluids/Electrolytes/Nutrition:Routine in and outs  8. Acute blood loss anemia.   Hemoglobin 11.9 on 5/27, labs ordered for Monday 9. Hypertension. Norvasc 5 mg daily, Toprol-XL 25 mg daily. ACE inhibitors held due to uptrending creatinine.   ?  Trending up, will consider medication adjustments if persistent 10. Diabetes mellitus with hyperglycemia. Hemoglobin A1c 8.1. Lantus insulin 8 units daily. Patient on Glucotrol 5 mg twice daily and Glucophage 1000 mg twice daily as well as Actos 15 mg daily prior to admission.    Glucophage 500mg  bid started, increased to 1000 twice daily on 5/27             -add actos as needed  Glucotrol added on 5/25  Remains elevated on 5/26, will not make further medication adjustments today given recent increase             Monitor with increased mobility 11.History of CAD/CABG/AVR. Patient on aspirin 81 mg daily and Plavix 75 mg daily prior to admission.  12. 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   Creatinine 1.06 on 5/27 13. PAD status post left SFA and stent 2016.Patient has been cleared to resume Plavix 14.  Hyponatremia  Sodium 134 on 5/27  Continue to monitor 15.  Sundowning  UA/urine culture ordered  Melatonin ordered  ECG reviewed, borderline within normal limits for the most part trazodone ordered  LOS: 4 days A FACE TO FACE EVALUATION WAS PERFORMED  Shye Doty Lorie Phenix 03/26/2021, 7:54 AM

## 2021-03-27 LAB — GLUCOSE, CAPILLARY
Glucose-Capillary: 132 mg/dL — ABNORMAL HIGH (ref 70–99)
Glucose-Capillary: 205 mg/dL — ABNORMAL HIGH (ref 70–99)
Glucose-Capillary: 123 mg/dL — ABNORMAL HIGH (ref 70–99)
Glucose-Capillary: 112 mg/dL — ABNORMAL HIGH (ref 70–99)

## 2021-03-27 MED ORDER — ADULT MULTIVITAMIN W/MINERALS CH
1.0000 | ORAL_TABLET | Freq: Every day | ORAL | Status: DC
  Administered 2021-03-27 – 2021-03-31 (×5): 1 via ORAL
  Filled 2021-03-27 (×5): qty 1

## 2021-03-27 MED ORDER — ENSURE MAX PROTEIN PO LIQD
11.0000 [oz_av] | Freq: Two times a day (BID) | ORAL | Status: DC
  Administered 2021-03-27 – 2021-03-30 (×5): 11 [oz_av] via ORAL

## 2021-03-27 NOTE — Progress Notes (Signed)
Initial Nutrition Assessment  RD working remotely.  DOCUMENTATION CODES:   Not applicable  INTERVENTION:   - Ensure Max po BID, each supplement provides 150 kcal and 30 grams of protein  - MVI with minerals daily  - Liberalize diet to Carb Modified  NUTRITION DIAGNOSIS:   Increased nutrient needs related to post-op healing as evidenced by estimated needs.  GOAL:   Patient will meet greater than or equal to 90% of their needs  MONITOR:   PO intake,Supplement acceptance,Labs,Weight trends  REASON FOR ASSESSMENT:   Consult Poor PO  ASSESSMENT:   70 year old male with PMH of prostate cancer s/p laparoscopic radical prostatectomy in 2014, PAD s/p left SFA and stent in 2016, DM, HTN, CAD s/p CABG in 2014 and aortic valve replacement in 2021, tobacco use, CKD stage III. Presented 03/18/21 after mechanical fall and was found to have displaced left femoral neck fracture. Pt uderwent left total hip arthroplasty on 5/19. Admitted to CIR on 5/23.   Noted target d/c date of 6/01.  RD consulted for poor PO intake. RD was unable to reach pt via phone call to room. Pt with meal completions for 50-100% documented for last 8 meals.  Reviewed weight history in chart. Last available weight prior to acute care admission on 5/19 was from 2014. Weight appears stable over time.  RD to order oral nutrition supplements to aid pt in meeting kcal and protein needs. Will order daily MVI with minerals. Also recommend removing Heart Healthy diet restriction from pt's diet order. Discussed with MD who agrees. Verbal with readback order placed for Carb Modified diet.  Meal Completion: 50-100% x last 8 documented meals  Medications reviewed and include: colace, pepcid, glucotrol, SSI, lantus 8 units daily, melatonin, metformin, senna  Labs reviewed: sodium 134, hemoglobin 7.9 CBG's: 129-169 x 24 hours  NUTRITION - FOCUSED PHYSICAL EXAM:  Unable to complete at this time. RD working  remotely.  Diet Order:   Diet Order            Diet Carb Modified Fluid consistency: Thin; Room service appropriate? Yes  Diet effective now                 EDUCATION NEEDS:   No education needs have been identified at this time  Skin:  Skin Assessment: Skin Integrity Issues: Incisions: L hip  Last BM:  03/24/21  Height:   Ht Readings from Last 1 Encounters:  03/22/21 5' 9.5" (1.765 m)    Weight:   Wt Readings from Last 1 Encounters:  03/22/21 91.9 kg    BMI:  Body mass index is 29.49 kg/m.  Estimated Nutritional Needs:   Kcal:  2100-2300  Protein:  105-120 grams  Fluid:  >/= 2.0 L    Gustavus Bryant, MS, RD, LDN Inpatient Clinical Dietitian Please see [facility] for contact information.

## 2021-03-27 NOTE — Progress Notes (Signed)
Physical Therapy Session Note  Patient Details  Name: Dominic Mendoza MRN: [id number] Date of Birth: 04/18/1951  Today's Date: 03/27/2021 PT Individual Time: [phone] and [id number] PT Individual Time Calculation (min): 55 min and 40 min   Short Term Goals: Week 1:  PT Short Term Goal 1 (Week 1): STG=LTG due to ELOS  Skilled Therapeutic Interventions/Progress Updates:   Treatment Session 1 Received pt sitting in bed, pt agreeable to PT treatment, and reported pain 8/10 in L hip. RN present to administer medications and repositioning, rest breaks, and distraction done to reduce pain levels. Session with emphasis on functional mobility/transfers, generalized strengthening, dynamic standing balance/coordination, gait training, stair navigation, and improved activity tolerance. Donned bilateral ted hose with total A and pt transferred supine<>sitting EOB with supervision and use of bedrails from flat bed and donned shoes with max A. Sit<>stand with RW and CGA to pull pants over hips and pt ambulated 2ft with RW and CGA to WC with cues for turning technique. Doffed dirty shirt and donned clean one with supervision and pt transported to therapy gym in [facility] total A for time management purposes. Pt navigated 4 steps with 2 rails and light min A ascending and descending with a step to pattern with cues for "up with the good and down with the bad" technique. Pt reported increased pain while standing and then reported feeling nauseous; required extensive seated rest/water break for symptoms to subside. Pt performed TUG with RW and CGA with average of 70 seconds; cues to slide RW rather than picking it up: Trial 1: 80 seconds Trial 2: 66 seconds Educated pt on test results and significance indicating high fall risk and importance of using RW for safety purposes; pt verbalized understanding.  Of note, pt with increased L hip pain when sitting down during session; educated pt on kicking LLE out when sitting and pt  reported pain relief. Pt appeared confused this morning regarding discharge dates and getting days/dates confused thinking today was Monday. Pt ambulated 31ft with RW and CGA heading towards room but requested to stop due to increased nausea and L hip pain. Pt transported back to room in [facility] total A and transferred WC<>recliner stand<>pivot with RW and CGA. Concluded session with pt sitting in recliner, needs within reach, and chair pad alarm on. Provided pt with blanket for comfort.    Treatment Session 2 Received pt supine in bed asleep with wife present at bedside, pt easily woken, agreeable to PT treatment, and reported pain 2/10 in L hip. RN notified and present to administer medications. Repositioning, rest breaks, and distraction done to reduce pain levels. Session with emphasis on functional mobility/transfers, generalized strengthening, dynamic standing balance/coordination, gait training, and improved activity tolerance. Pt transferred supine<>sitting EOB with use of bedrails and supervision and donned shoes sitting EOB with max A. Sit<>stand from elevated bed with CGA and ambulated 141ft with RW and CGA/supervision to therapy gym with pt's wife providing WC follow. Pt demonstrates decreased cadence, decreased stance time on L, narrow BOS, and decreased trunk rotation. Worked on dynamic standing balance tossing horseshoes with LUE without AD and CGA/min A for balance x 5 trials and x 1 trial sitting in WC. Pt transported back to room in [facility] total A and requested to return to bed. Stand<>pivot WC<>bed with RW and CGA and sit<>supine with min A for LLE management.  Concluded session with pt supine in bed, needs within reach, and bed alarm on with wife present at bedside.   Therapy Documentation  Precautions:  Precautions Precautions: Fall Restrictions Weight Bearing Restrictions: No Other Position/Activity Restrictions: WBAT  Therapy/Group: Individual Therapy Alfonse Alpers PT, DPT    03/27/2021, 7:16 AM

## 2021-03-27 NOTE — Progress Notes (Signed)
Wife is requesting pass to take pt outside. Called On call and grounds pass approved.

## 2021-03-27 NOTE — Progress Notes (Signed)
[facility] PHYSICAL MEDICINE & REHABILITATION PROGRESS NOTE  Subjective/Complaints: Left hip sore, appetite not great.   ROS: Patient denies fever, rash, sore throat, blurred vision, nausea, vomiting, diarrhea, cough, shortness of breath or chest pain,  headache, or mood change.    Objective: Vital Signs: Blood pressure (!) 164/62, pulse 67, temperature 97.8 F (36.6 C), temperature source Oral, resp. rate 17, height 5' 9.5" (1.765 m), weight 91.9 kg, SpO2 100 %. DG Chest 2 View  Result Date: 03/26/2021 CLINICAL DATA:  Fall. EXAM: CHEST - 2 VIEW COMPARISON:  Mar 18, 2021. FINDINGS: The heart size and mediastinal contours are within normal limits. Status post coronary artery bypass graft and aortic valve repair. No pneumothorax or pleural effusion is noted. Right lung is clear. Mild left basilar subsegmental atelectasis is noted. The visualized skeletal structures are unremarkable. IMPRESSION: Mild left basilar subsegmental atelectasis. Electronically Signed   By: Marijo Conception M.D.   On: 03/26/2021 16:44   Recent Labs    03/26/21 0520  WBC 8.7  HGB 7.9*  HCT 24.2*  PLT 155   Recent Labs    03/26/21 0520  NA 134*  K 4.5  CL 100  CO2 28  GLUCOSE 160*  BUN 14  CREATININE 1.06  CALCIUM 8.7*    Intake/Output Summary (Last 24 hours) at 03/27/2021 0824 Last data filed at 03/27/2021 0204 Gross per 24 hour  Intake 480 ml  Output 400 ml  Net 80 ml        Physical Exam: BP (!) 164/62   Pulse 67   Temp 97.8 F (36.6 C) (Oral)   Resp 17   Ht 5' 9.5" (1.765 m)   Wt 91.9 kg   SpO2 100%   BMI 29.49 kg/m  Constitutional: No distress . Vital signs reviewed. HEENT: EOMI, oral membranes moist Neck: supple Cardiovascular: RRR with murmur. No JVD    Respiratory/Chest: CTA Bilaterally without wheezes or rales. Normal effort    GI/Abdomen: BS +, non-tender, non-distended Ext: no clubbing, cyanosis, or edema Psych: pleasant and cooperative. Skin: Warm and dry.   Left elbow  with dressing CDI Left knee abrasion healing Left hip incision with staples CDI Psych: Normal mood.  Normal behavior. Musc: Left elbow hip knee with tenderness, improving Neuro: Alert Normal insight and awareness.  Motor: Bilateral upper extremities, right lower extremity: 5/5 proximal distal Left lower extremity: Hip flexion 2/5, knee extension 3-/5 (ongoing pain inhibition), ankle dorsiflexion 5/5--stable  Assessment/Plan: 1. Functional deficits which require 3+ hours per day of interdisciplinary therapy in a comprehensive inpatient rehab setting.  Physiatrist is providing close team supervision and 24 hour management of active medical problems listed below.  Physiatrist and rehab team continue to assess barriers to discharge/monitor patient progress toward functional and medical goals   Care Tool:  Bathing    Body parts bathed by patient: Right arm,Left arm,Chest,Abdomen,Right upper leg,Left upper leg,Right lower leg,Face,Front perineal area,Buttocks,Left lower leg   Body parts bathed by helper: Left lower leg     Bathing assist Assist Level: Minimal Assistance - Patient > 75%     Upper Body Dressing/Undressing Upper body dressing   What is the patient wearing?: Pull over shirt    Upper body assist Assist Level: Minimal Assistance - Patient > 75%    Lower Body Dressing/Undressing Lower body dressing      What is the patient wearing?: Underwear/pull up,Pants     Lower body assist Assist for lower body dressing: Minimal Assistance - Patient > 75%  Toileting Toileting    Toileting assist Assist for toileting: Minimal Assistance - Patient > 75%     Transfers Chair/bed transfer  Transfers assist  Chair/bed transfer activity did not occur: Safety/medical concerns (orthostatic vitals/symptomatic)  Chair/bed transfer assist level: Contact Guard/Touching assist     Locomotion Ambulation   Ambulation assist      Assist level: Contact Guard/Touching  assist Assistive device: Walker-rolling Max distance: 110'   Walk 10 feet activity   Assist     Assist level: Contact Guard/Touching assist Assistive device: Walker-rolling   Walk 50 feet activity   Assist    Assist level: Contact Guard/Touching assist Assistive device: Walker-rolling    Walk 150 feet activity   Assist Walk 150 feet activity did not occur: Safety/medical concerns         Walk 10 feet on uneven surface  activity   Assist Walk 10 feet on uneven surfaces activity did not occur: Safety/medical concerns   Assist level: Contact Guard/Touching assist Assistive device: Aeronautical engineer Will patient use wheelchair at discharge?: No Type of Wheelchair: Manual    Wheelchair assist level: Supervision/Verbal cueing Max wheelchair distance: 115'    Wheelchair 50 feet with 2 turns activity    Assist        Assist Level: Supervision/Verbal cueing   Wheelchair 150 feet activity     Assist  Wheelchair 150 feet activity did not occur: Safety/medical concerns       BP (!) 164/62   Pulse 67   Temp 97.8 F (36.6 C) (Oral)   Resp 17   Ht 5' 9.5" (1.765 m)   Wt 91.9 kg   SpO2 100%   BMI 29.49 kg/m    Medical Problem List and Plan: 1.Debilitysecondary to displaced left femoral neck fracture. 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 [name]. Weightbearing as tolerated  Continue CIR PT, OT 2. Antithrombotics: -DVT/anticoagulation:SCDs -antiplatelet therapy: Aspirin 81 mg twice daily for DVT prophylaxis x3 weeks then aspirin 81 mg daily and Plavix 75 mg daily.  Vascular study negative for DVT 3. Pain Management:Hydrocodone/Robaxin as needed Added lidocaine patch for left rib pain  Pt would like to try kpad for rib/hip pain  Monitor with increased exertion 4. Mood:Provide emotional support -antipsychotic agents:  N/A 5. Neuropsych: This patientiscapable of making decisions on hisown behalf. 6. Skin/Wound Care:continue post-op dressing for now, no visible drainage from wound -foam dressings to protect left elbow and knee abrasions. 7. Fluids/Electrolytes/Nutrition:encouraged PO  -will ask RD for recs  8. Acute blood loss anemia.   Hemoglobin 11.9 on 5/27, labs ordered for Monday 9. Hypertension. Norvasc 5 mg daily, Toprol-XL 25 mg daily. ACE inhibitors held due to uptrending creatinine.   SBP remains elevated 10. Diabetes mellitus with hyperglycemia. Hemoglobin A1c 8.1. Lantus insulin 8 units daily. Patient on Glucotrol 5 mg twice daily and Glucophage 1000 mg twice daily as well as Actos 15 mg daily prior to admission.   Glucophage 500mg  bid started, increased to 1000 twice daily on 5/27             -add actos as needed  Glucotrol added on 5/25   CBG (last 3)  Recent Labs    03/26/21 1615 03/26/21 2111 03/27/21 0632  GLUCAP 129* 134* 132*    -improving control 5/28 11.History of CAD/CABG/AVR. Patient on aspirin 81 mg daily and Plavix 75 mg daily prior to admission.  12. 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   Creatinine 1.06  on 5/27 13. PAD status post left SFA and stent 2016.Patient has been cleared to resume Plavix 14.  Hyponatremia  Sodium 134 on 5/27  Continue to monitor 15.  Sundowning  UA/urine culture ordered---pend  Melatonin ordered  ECG reviewed, borderline within normal limits for the most part trazodone ordered  LOS: 5 days A FACE TO FACE EVALUATION WAS PERFORMED  Meredith Staggers 03/27/2021, 8:24 AM

## 2021-03-27 NOTE — Progress Notes (Addendum)
Occupational Therapy Session Note  Patient Details  Name: Dominic Mendoza MRN: [id number] Date of Birth: 18-May-1951  Today's Date: 03/27/2021 OT Individual Time: 1003-1057 and 1301-1330 OT Individual Time Calculation (min): 54 min and 29 min   Short Term Goals: Week 1:  OT Short Term Goal 1 (Week 1): Patient will complete LB bathing/dressing min A using AE PRN. OT Short Term Goal 2 (Week 1): Patient will complete 3/3 toileting tasks with spvsn and LRAD. OT Short Term Goal 3 (Week 1): Patient will complete stand pivot transfers using LRAD with CGA. OT Short Term Goal 4 (Week 1): Patient will complete dynamic standing grooming task with LRAD with CGA.  Skilled Therapeutic Interventions/Progress Updates:    Pt greeted while in the w/c, requesting to urgently use the restroom. Ambulatory transfer to the elevated toilet completed with CGA. Pt able to lower clothing with cuing. Ultimately needed to use the urinal to void bladder due to discomfort with positioning on elevated toilet. His brief was found to be minimally soiled from BM. With instruction, pt able to rip off brief and doff his pants. OT retrieved a reacher while NT waited with him for a bit. Worked on AE training by donning new LB garments. Pt requiring max cues for calmness as well as instruction and Max A for using reacher successfully. Note that pt was unable to thread LEs into pull up + pants beforehand without AE use. Perihygiene also completed, pt assisted OT with 1 wash cloth before stating "I'm done. You'll have to do it." Assistance for thoroughness due to BM residue. Pt pulled up pants and then ambulated with device to the sink. Hand washing and oral care completed while standing before returning to the w/c. Attempted blocked practice reacher training by having pt thread LEs into another pair of shorts. Pt easily frustrated, when cued to locate the hole of the pant leg to set up the reacher, he kept placing the handle end of the reacher  behind the tag of his shorts (pt wearing his clean glasses). Brief visual assessment performed with pt reading information from his CIR whiteboard. Difficulties with reacher training appear to be more cognitive in nature vs visual. When pt gave up using the reacher, he bent over and threaded both feet into pants given increased time and close supervision for dynamic balance. Pt remained in the w/c at close of session, all needs within reach and safety belt fastened. Tx focus placed on adaptive self care skills, functional ambulation, dynamic standing balance, and functional cognition.   2nd Session 1:1 tx (29 min) Pt greeted in bed and premedicated for Rt LE pain. He was agreeable to session, wife also present. Pt transitioned to EOB without assistance, HOB raised using the bedrail. Worked on UB strengthening/endurance while using the 3# bar x10-15 reps each exercise. Exercise techniques modified to accommodate shoulder pain/past injuries. Pts spouse participated in 1 exercise with him with OT instruction which appeared to brighten affect. Short distance ambulatory transfer to the w/c completed using RW with CGA. Left him sitting up with all needs within reach and safety belt fastened.    Therapy Documentation Precautions:  Precautions Precautions: Fall Restrictions Weight Bearing Restrictions: No Other Position/Activity Restrictions: WBAT Pain: in the Rt LE during 1st session, RN in to provide pain medicine during tx. Per RN, they are going to provide him with a k-pad later today Pain Assessment Pain Scale: 0-10 Pain Score: 8  Pain Type: Surgical pain Pain Location: Knee Pain Orientation: Right Pain  Radiating Towards: hurts behind the knee Pain Descriptors / Indicators: Spasm Pain Frequency: Constant Pain Onset: On-going Pain Intervention(s): Medication (See eMAR) ADL: ADL Eating: Set up Where Assessed-Eating: Bed level Grooming: Setup Where Assessed-Grooming: Bed level Upper Body  Bathing: Setup Where Assessed-Upper Body Bathing: Bed level Lower Body Bathing: Moderate assistance Where Assessed-Lower Body Bathing: Bed level Upper Body Dressing: Minimal assistance Where Assessed-Upper Body Dressing: Bed level Lower Body Dressing: Moderate assistance Where Assessed-Lower Body Dressing: Bed level Toileting: Not assessed Toilet Transfer: Not assessed Tub/Shower Transfer: Not assessed ADL Comments: Toileting/showering not assessed due to orthostatic vitals/symptomatic     Therapy/Group: Individual Therapy  Dominic Mendoza [name] 03/27/2021, 12:52 PM

## 2021-03-27 NOTE — Plan of Care (Signed)
  Problem: Consults Goal: RH GENERAL PATIENT EDUCATION Description: See Patient Education module for education specifics. Outcome: Progressing   Problem: RH BOWEL ELIMINATION Goal: RH STG MANAGE BOWEL WITH ASSISTANCE Description: STG Manage Bowel with mod I Assistance. Outcome: Progressing Goal: RH STG MANAGE BOWEL W/MEDICATION W/ASSISTANCE Description: STG Manage Bowel with Medication with mod I Assistance. Outcome: Progressing   Problem: RH BLADDER ELIMINATION Goal: RH STG MANAGE BLADDER WITH ASSISTANCE Description: STG Manage Bladder With mod I Assistance Outcome: Progressing   Problem: RH SKIN INTEGRITY Goal: RH STG ABLE TO PERFORM INCISION/WOUND CARE W/ASSISTANCE Description: STG Able To Perform Incision/Wound Care With MIN Assistance. Outcome: Progressing   Problem: RH SAFETY Goal: RH STG ADHERE TO SAFETY PRECAUTIONS W/ASSISTANCE/DEVICE Description: STG Adhere to Safety Precautions With CUEs/Reminders Assistance/Device. Outcome: Progressing   Problem: RH PAIN MANAGEMENT Goal: RH STG PAIN MANAGED AT OR BELOW PT'S PAIN GOAL Description: At or below level 4 Outcome: Progressing   Problem: RH KNOWLEDGE DEFICIT GENERAL Goal: RH STG INCREASE KNOWLEDGE OF SELF CARE AFTER HOSPITALIZATION Description: Patient and wife will be able to manage care independently at discharge using handouts and educational resources Outcome: Progressing

## 2021-03-28 LAB — GLUCOSE, CAPILLARY
Glucose-Capillary: 128 mg/dL — ABNORMAL HIGH (ref 70–99)
Glucose-Capillary: 112 mg/dL — ABNORMAL HIGH (ref 70–99)
Glucose-Capillary: 184 mg/dL — ABNORMAL HIGH (ref 70–99)
Glucose-Capillary: 124 mg/dL — ABNORMAL HIGH (ref 70–99)

## 2021-03-28 LAB — URINE CULTURE: Culture: <10000 — AB

## 2021-03-28 MED ORDER — SORBITOL 70 % SOLN
60.0000 mL | Freq: Once | Status: AC
  Administered 2021-03-28: 60 mL via ORAL
  Filled 2021-03-28: qty 60

## 2021-03-28 NOTE — Plan of Care (Signed)
  Problem: Consults Goal: RH GENERAL PATIENT EDUCATION Description: See Patient Education module for education specifics. Outcome: Progressing   Problem: RH BOWEL ELIMINATION Goal: RH STG MANAGE BOWEL WITH ASSISTANCE Description: STG Manage Bowel with mod I Assistance. Outcome: Progressing Goal: RH STG MANAGE BOWEL W/MEDICATION W/ASSISTANCE Description: STG Manage Bowel with Medication with mod I Assistance. Outcome: Progressing   Problem: RH BLADDER ELIMINATION Goal: RH STG MANAGE BLADDER WITH ASSISTANCE Description: STG Manage Bladder With mod I Assistance Outcome: Progressing   Problem: RH SKIN INTEGRITY Goal: RH STG ABLE TO PERFORM INCISION/WOUND CARE W/ASSISTANCE Description: STG Able To Perform Incision/Wound Care With MIN Assistance. Outcome: Progressing   Problem: RH SAFETY Goal: RH STG ADHERE TO SAFETY PRECAUTIONS W/ASSISTANCE/DEVICE Description: STG Adhere to Safety Precautions With CUEs/Reminders Assistance/Device. Outcome: Progressing   Problem: RH PAIN MANAGEMENT Goal: RH STG PAIN MANAGED AT OR BELOW PT'S PAIN GOAL Description: At or below level 4 Outcome: Progressing   Problem: RH KNOWLEDGE DEFICIT GENERAL Goal: RH STG INCREASE KNOWLEDGE OF SELF CARE AFTER HOSPITALIZATION Description: Patient and wife will be able to manage care independently at discharge using handouts and educational resources Outcome: Progressing

## 2021-03-29 LAB — CBC WITH DIFFERENTIAL/PLATELET
Abs Immature Granulocytes: 0.16 10*3/uL — ABNORMAL HIGH (ref 0.00–0.07)
Basophils Absolute: 0 10*3/uL (ref 0.0–0.1)
Basophils Relative: 0 %
Eosinophils Absolute: 0.3 10*3/uL (ref 0.0–0.5)
Eosinophils Relative: 3 %
HCT: 24.9 % — ABNORMAL LOW (ref 39.0–52.0)
Hemoglobin: 8.1 g/dL — ABNORMAL LOW (ref 13.0–17.0)
Immature Granulocytes: 2 %
Lymphocytes Relative: 20 %
Lymphs Abs: 1.8 10*3/uL (ref 0.7–4.0)
MCH: 30.3 pg (ref 26.0–34.0)
MCHC: 32.5 g/dL (ref 30.0–36.0)
MCV: 93.3 fL (ref 80.0–100.0)
Monocytes Absolute: 0.5 10*3/uL (ref 0.1–1.0)
Monocytes Relative: 6 %
Neutro Abs: 5.9 10*3/uL (ref 1.7–7.7)
Neutrophils Relative %: 69 %
Platelets: 193 10*3/uL (ref 150–400)
RBC: 2.67 MIL/uL — ABNORMAL LOW (ref 4.22–5.81)
RDW: 15.3 % (ref 11.5–15.5)
WBC: 8.7 10*3/uL (ref 4.0–10.5)
nRBC: 0 % (ref 0.0–0.2)

## 2021-03-29 LAB — GLUCOSE, CAPILLARY
Glucose-Capillary: 101 mg/dL — ABNORMAL HIGH (ref 70–99)
Glucose-Capillary: 182 mg/dL — ABNORMAL HIGH (ref 70–99)
Glucose-Capillary: 124 mg/dL — ABNORMAL HIGH (ref 70–99)
Glucose-Capillary: 138 mg/dL — ABNORMAL HIGH (ref 70–99)

## 2021-03-29 NOTE — Progress Notes (Signed)
PHYSICAL MEDICINE & REHABILITATION PROGRESS NOTE  Subjective/Complaints:  Pt reports Leg bothering him sometimes- not with therapy- at rest/at night mainly.   L hip is main source of pain, but as above, only with sleep- didn't sleep well o/n.  B/B OK.    ROS:  Pt denies SOB, abd pain, CP, N/V/C/D, and vision changes   Objective: Vital Signs: Blood pressure (!) 141/58, pulse (!) 54, temperature 97.8 F (36.6 C), resp. rate 17, height 5' 9.5" (1.765 m), weight 91.9 kg, SpO2 97 %. No results found. Recent Labs    03/29/21 0752  WBC 8.7  HGB 8.1*  HCT 24.9*  PLT 193   No results for input(s): NA, K, CL, CO2, GLUCOSE, BUN, CREATININE, CALCIUM in the last 72 hours.  Intake/Output Summary (Last 24 hours) at 03/29/2021 0919 Last data filed at 03/29/2021 0458 Gross per 24 hour  Intake 222 ml  Output 1100 ml  Net -878 ml        Physical Exam: BP (!) 141/58 (BP Location: Right Arm)   Pulse (!) 54   Temp 97.8 F (36.6 C)   Resp 17   Ht 5' 9.5" (1.765 m)   Wt 91.9 kg   SpO2 97%   BMI 29.49 kg/m     General: awake, alert, appropriate, dysarthric speech noted; sitting up in bed;  NAD HENT: conjugate gaze; oropharynx moist CV: bradycardic rate; no JVD Pulmonary: CTA B/L; no W/R/R- good air movement GI: soft, NT, ND, (+)BS Psychiatric: appropriate Neurological: alert. Skin: Warm and dry.   Left elbow with dressing CDI Left knee abrasion healing Left hip incision with staples- no dressing in place- no erythema/drainage Musc: Left elbow hip knee with tenderness, improving Neuro: Alert Normal insight and awareness.  Motor: Bilateral upper extremities, right lower extremity: 5/5 proximal distal Left lower extremity: Hip flexion 2/5, knee extension 3-/5 (ongoing pain inhibition), ankle dorsiflexion 5/5--stable  Assessment/Plan: 1. Functional deficits which require 3+ hours per day of interdisciplinary therapy in a comprehensive inpatient rehab  setting.  Physiatrist is providing close team supervision and 24 hour management of active medical problems listed below.  Physiatrist and rehab team continue to assess barriers to discharge/monitor patient progress toward functional and medical goals   Care Tool:  Bathing    Body parts bathed by patient: Right arm,Left arm,Chest,Abdomen,Right upper leg,Left upper leg,Right lower leg,Face,Front perineal area,Buttocks,Left lower leg   Body parts bathed by helper: Left lower leg     Bathing assist Assist Level: Minimal Assistance - Patient > 75%     Upper Body Dressing/Undressing Upper body dressing   What is the patient wearing?: Pull over shirt    Upper body assist Assist Level: Minimal Assistance - Patient > 75%    Lower Body Dressing/Undressing Lower body dressing      What is the patient wearing?: Underwear/pull up,Pants     Lower body assist Assist for lower body dressing: Minimal Assistance - Patient > 75%     Toileting Toileting    Toileting assist Assist for toileting: Minimal Assistance - Patient > 75%     Transfers Chair/bed transfer  Transfers assist  Chair/bed transfer activity did not occur: Safety/medical concerns (orthostatic vitals/symptomatic)  Chair/bed transfer assist level: Contact Guard/Touching assist     Locomotion Ambulation   Ambulation assist      Assist level: Contact Guard/Touching assist Assistive device: Walker-rolling Max distance: 166ft   Walk 10 feet activity   Assist     Assist level: Contact Guard/Touching assist  Assistive device: Walker-rolling   Walk 50 feet activity   Assist    Assist level: Contact Guard/Touching assist Assistive device: Walker-rolling    Walk 150 feet activity   Assist Walk 150 feet activity did not occur: Safety/medical concerns  Assist level: Contact Guard/Touching assist Assistive device: Walker-rolling    Walk 10 feet on uneven surface  activity   Assist Walk 10  feet on uneven surfaces activity did not occur: Safety/medical concerns   Assist level: Contact Guard/Touching assist Assistive device: Aeronautical engineer Will patient use wheelchair at discharge?: No Type of Wheelchair: Manual    Wheelchair assist level: Supervision/Verbal cueing Max wheelchair distance: 115'    Wheelchair 50 feet with 2 turns activity    Assist        Assist Level: Supervision/Verbal cueing   Wheelchair 150 feet activity     Assist  Wheelchair 150 feet activity did not occur: Safety/medical concerns       BP (!) 141/58 (BP Location: Right Arm)   Pulse (!) 54   Temp 97.8 F (36.6 C)   Resp 17   Ht 5' 9.5" (1.765 m)   Wt 91.9 kg   SpO2 97%   BMI 29.49 kg/m    Medical Problem List and Plan: 1.Debilitysecondary to displaced left femoral neck fracture. 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 [name]. Weightbearing as tolerated  con't PT and OT- pt can get staples out 04/01/21.  2. Antithrombotics: -DVT/anticoagulation:SCDs -antiplatelet therapy: Aspirin 81 mg twice daily for DVT prophylaxis x3 weeks then aspirin 81 mg daily and Plavix 75 mg daily.  Vascular study negative for DVT 3. Pain Management:Hydrocodone/Robaxin as needed Added lidocaine patch for left rib pain  Pt would like to try kpad for rib/hip pain  5/30- main pain at night- using kpad- con't regimen  Monitor with increased exertion 4. Mood:Provide emotional support -antipsychotic agents: N/A 5. Neuropsych: This patientiscapable of making decisions on hisown behalf. 6. Skin/Wound Care:continue post-op dressing for now, no visible drainage from wound -foam dressings to protect left elbow and knee abrasions. 7. Fluids/Electrolytes/Nutrition:encouraged PO  -will ask RD for recs  8. Acute blood loss anemia.   H5/30- Pt's Hb 8.1- stable-con't to  monitor 9. Hypertension. Norvasc 5 mg daily, Toprol-XL 25 mg daily. ACE inhibitors held due to uptrending creatinine.   SBP remains elevated  5/30- BP borderline elevated but pulse in mid 50s- con't regimen 10. Diabetes mellitus with hyperglycemia. Hemoglobin A1c 8.1. Lantus insulin 8 units daily. Patient on Glucotrol 5 mg twice daily and Glucophage 1000 mg twice daily as well as Actos 15 mg daily prior to admission.   Glucophage 500mg  bid started, increased to 1000 twice daily on 5/27             -add actos as needed  Glucotrol added on 5/25  5/30- BGs better controlled- con't regimen- only 1 BG >120   CBG (last 3)  Recent Labs    03/28/21 1628 03/28/21 2101 03/29/21 0629  GLUCAP 184* 124* 101*   11.History of CAD/CABG/AVR. Patient on aspirin 81 mg daily and Plavix 75 mg daily prior to admission.  12. 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   Creatinine 1.06 on 5/27 13. PAD status post left SFA and stent 2016.Patient has been cleared to resume Plavix 14.  Hyponatremia  Sodium 134 on 5/27  Continue to monitor 15.  Sundowning  UA/urine culture ordered---pend  Melatonin ordered  ECG reviewed, borderline within normal limits  for the most part trazodone ordered  LOS: 7 days A FACE TO FACE EVALUATION WAS PERFORMED  Elijah Phommachanh 03/29/2021, 9:19 AM

## 2021-03-29 NOTE — Progress Notes (Signed)
Physical Therapy Session Note  Patient Details  Name: Dominic Mendoza MRN: [id number] Date of Birth: 1951-05-30  Today's Date: 03/29/2021 PT Individual Time: 1047-1200 PT Individual Time Calculation (min): 73 min   Short Term Goals: Week 1:  PT Short Term Goal 1 (Week 1): STG=LTG due to ELOS  Skilled Therapeutic Interventions/Progress Updates:    Patient in w/c in dayroom following OT session.  Reports pain better controlled and son to be here today and tomorrow prior to d/c Wednesday.  Patient sit to stand to RW with S.  Ambulated 150' with S with RW.  Patient performed standing balance on foam performing head turns, head nods and ball toss to self with intermittent CGA.  Step ups x 10 to foam as well with 1 UE support.  Patient negotiated obstacle course with RW around cones and over hurdles with RW and close S to CGA.  Patient assisted in w/c to general gym.  Negotiated 4 steps with 2 rails and S with cues for sequence.  Then negotiated curb step with RW and close S.  Patient standing at counter to perform therex as noted below.  All issued to HEP with written handout given.  Patient ambulated back to room x 140' with RW and S cues for posture, forward gaze.  PAtient seated EOB while recliner set up.  Transfer to recliner with RW and S.  Patient left in recliner with seat belt alarm, call bell and needs in reach.  Therapy Documentation Precautions:  Precautions Precautions: Fall Restrictions Weight Bearing Restrictions: Yes LLE Weight Bearing: Weight bearing as tolerated Other Position/Activity Restrictions: WBAT Pain: Pain Assessment Pain Scale: 0-10 Pain Score: 7  Pain Type: Acute pain Pain Location: Hip Pain Orientation: Left Pain Descriptors / Indicators: Discomfort;Tightness Pain Onset: With Activity Pain Intervention(s): Rest   Exercises: General Exercises - Lower Extremity Hip ABduction/ADduction: Strengthening;Both;10 reps;Standing Hip Flexion/Marching:  Strengthening;Both;10 reps;Standing Heel Raises: Strengthening;Both;10 reps;Standing Mini-Sqauts: Strengthening;Both;10 reps;Standing Standing Knee Flexion: Strengthening;Both;10 reps Repetitive Sit to Stands: Two upper extremities;8 reps Repetitive Sit to Stands Level of Assist: Other (comment) (supervision)    Therapy/Group: Individual Therapy  Reginia Naas  Magda Kiel, PT 03/29/2021, 11:44 AM

## 2021-03-29 NOTE — Progress Notes (Signed)
Physical Therapy Session Note  Patient Details  Name: Dominic Mendoza MRN: [id number] Date of Birth: 06/02/1951  Today's Date: 03/29/2021 PT Individual Time: 1300-1400 PT Individual Time Calculation (min): 60 min   Short Term Goals: Week 1:  PT Short Term Goal 1 (Week 1): STG=LTG due to ELOS  Skilled Therapeutic Interventions/Progress Updates:  Pt resting in recliner.  He denied pain.    Therapeutic exercises performed with LEs to increase strength for functional mobility. Seated in wc- 10 x 1 bil adductor squeezes against airex mat, R/L long arc quad knee extensions with isometric hold at end range, 15 x 1 alternating ankle DF.    Stand pivot with close supervison and RW to w/c.  Balance training, standing on compliant mat with RW, 1UE support x 1.5 minute; standing without UE support x 1 minute.  No discernable sway noted.   Bil UE activity in standing on compliant mat, x 5 minutes, folding 5 pillow cases; seated rest, folding 7 wash cloths, then R/L shoulder elevation x 10. No LOB.    Advanced gait training on level tile with RW through obstacle course of 8 cones x 2,  enountering 2/16 with foot or RW.  At end of session, pt seated in recliner with needs at hand and seat belt alarm set.      Therapy Documentation Precautions:  Precautions Precautions: Fall Restrictions Weight Bearing Restrictions: Yes LLE Weight Bearing: Weight bearing as tolerated Other Position/Activity Restrictions: WBAT        Therapy/Group: Individual Therapy  Esiquio Boesen 03/29/2021, 4:17 PM

## 2021-03-29 NOTE — Progress Notes (Signed)
Patient ID: CAPTAIN BLUCHER, male   DOB: [birth year], 70 y.o.   MRN: [id number]  Spoke with wife via telephone to discuss equipment and home health follow up. Spoke with pt and he referred to wife. She voiced only need is 3 in 1 and want [facility] again since used them before. Reached out to [facility] to make referral. Work toward discharge 6/1.

## 2021-03-29 NOTE — Discharge Summary (Addendum)
Physician Discharge Summary  Patient ID: Dominic Mendoza MRN: [id number] DOB/AGE: 69/25/1952 69 y.o.  Admit date: 03/22/2021 Discharge date: 03/31/2021  Discharge Diagnoses:  Principal Problem:   Left displaced femoral neck fracture ([place]) Active Problems:   Hyponatremia   Uncontrolled type 2 diabetes mellitus with hyperglycemia (HCC)   Essential hypertension   Acute blood loss anemia   Postoperative pain   Orthostatic hypotension Prostate cancer with laparoscopic radical prostatectomy Obesity with BMI 29.92 CAD/CABG/AVR Tobacco abuse CKD stage III  Discharged Condition: Stable  Significant Diagnostic Studies: DG Chest 2 View  Result Date: 03/26/2021 CLINICAL DATA:  Fall. EXAM: CHEST - 2 VIEW COMPARISON:  Mar 18, 2021. FINDINGS: The heart size and mediastinal contours are within normal limits. Status post coronary artery bypass graft and aortic valve repair. No pneumothorax or pleural effusion is noted. Right lung is clear. Mild left basilar subsegmental atelectasis is noted. The visualized skeletal structures are unremarkable. IMPRESSION: Mild left basilar subsegmental atelectasis. Electronically Signed   By: Marijo Conception M.D.   On: 03/26/2021 16:44   DG Ribs Unilateral W/Chest Left  Result Date: 03/17/2021 CLINICAL DATA:  Recent fall with left-sided chest pain, initial encounter EXAM: LEFT RIBS AND CHEST - 3+ VIEW COMPARISON:  12/16/2015 FINDINGS: Heart is at the upper limits of normal in size. Postsurgical changes are seen. Aortic calcifications are noted. The lungs are well aerated bilaterally with mild left lung atelectatic changes. No definitive rib fractures are seen. No pneumothorax is noted. IMPRESSION: No acute rib fracture is noted.  Mild left lung atelectasis is seen. Electronically Signed   By: Inez Catalina M.D.   On: 03/17/2021 22:37   Pelvis Portable  Result Date: 03/18/2021 CLINICAL DATA:  Status post left hip replacement. EXAM: PORTABLE PELVIS 1-2 VIEWS COMPARISON:   Fluoroscopic images of same day. FINDINGS: The left acetabular and femoral components are well situated. Expected postoperative changes are seen in the surrounding soft tissues. IMPRESSION: Status post left total hip arthroplasty. Electronically Signed   By: Marijo Conception M.D.   On: 03/18/2021 21:21   CT Hip Left Wo Contrast  Result Date: 03/18/2021 CLINICAL DATA:  Fracture, hip EXAM: CT OF THE LEFT HIP WITHOUT CONTRAST TECHNIQUE: Multidetector CT imaging of the left hip was performed according to the standard protocol. Multiplanar CT image reconstructions were also generated. COMPARISON:  Same day hip and femur radiographs. FINDINGS: Bones/Joint/Cartilage Nondisplaced subcapital fracture of the proximal femur. Femoral head is well seated within the acetabulum with relative preservation of the joint space. Ligaments Suboptimally assessed by CT. Muscles and Tendons Within normal limits. Soft tissues Small lipohemarthrosis. IMPRESSION: 1. Nondisplaced subcapital fracture of the proximal femur. 2. Small lipohemarthrosis. Electronically Signed   By: Dahlia Bailiff MD   On: 03/18/2021 00:37   DG Chest Port 1 View  Result Date: 03/18/2021 CLINICAL DATA:  Pre-op respiratory exam EXAM: PORTABLE CHEST 1 VIEW COMPARISON:  Mar 17, 2021. FINDINGS: Median sternotomy wires. The heart size and mediastinal contours are unchanged. Low lung volumes with bibasilar atelectasis. No visible pleural effusion or pneumothorax. The visualized skeletal structures are stable. IMPRESSION: Low lung volumes with bibasilar atelectasis. Electronically Signed   By: Dahlia Bailiff MD   On: 03/18/2021 01:09   DG C-Arm 1-60 Min-No Report  Result Date: 03/18/2021 Fluoroscopy was utilized by the requesting physician.  No radiographic interpretation.   DG HIP OPERATIVE UNILAT W OR W/O PELVIS LEFT  Result Date: 03/18/2021 CLINICAL DATA:  Left hip replacement EXAM: OPERATIVE LEFT HIP WITH PELVIS  COMPARISON:  CT from earlier in the same  day. FLUOROSCOPY TIME:  Radiation Exposure Index (as provided by the fluoroscopic device): 1.13 mGy If the device does not provide the exposure index: Fluoroscopy Time:  6 seconds Number of Acquired Images:  2 FINDINGS: Left hip prosthesis is noted in satisfactory position. No acute bony or soft tissue abnormality is noted. IMPRESSION: Status post left hip replacement. Electronically Signed   By: Inez Catalina M.D.   On: 03/18/2021 20:26   DG Hip Unilat W or Wo Pelvis 2-3 Views Left  Result Date: 03/17/2021 CLINICAL DATA:  Recent fall with left hip pain, initial encounter EXAM: DG HIP (WITH OR WITHOUT PELVIS) 3V LEFT COMPARISON:  None. FINDINGS: Pelvic ring is intact. Mild irregularity is noted at the junction of the femoral neck and femoral head which may be related to an undisplaced fracture. CT would be helpful for further evaluation. IMPRESSION: Findings suspicious for subcapital femoral neck fracture. CT would be helpful for further evaluation. Electronically Signed   By: Inez Catalina M.D.   On: 03/17/2021 22:36   DG Femur Min 2 Views Left  Result Date: 03/17/2021 CLINICAL DATA:  Recent fall with left leg pain, initial encounter EXAM: LEFT FEMUR 2 VIEWS COMPARISON:  None. FINDINGS: SFA stenting is seen. Irregularity in the subcapital femoral neck is again seen suspicious for fracture. More distal femur appears within normal limits. IMPRESSION: Irregularity in the subcapital femoral neck suspicious for undisplaced fracture. Electronically Signed   By: Inez Catalina M.D.   On: 03/17/2021 22:36   VAS [place] LOWER EXTREMITY VENOUS (DVT)  Result Date: 03/22/2021  Lower Venous DVT Study Patient Name:  Dominic Mendoza  Date of Exam:   03/22/2021 Medical Rec #: [id number]      Accession #:    [id number] Date of Birth: 1950/11/11       Patient Gender: M Patient Age:   69Y Exam Location:  [facility] Procedure:      VAS [place] LOWER EXTREMITY VENOUS (DVT) Referring Phys: [place]  --------------------------------------------------------------------------------  Indications: Swelling.  Limitations: Poor ultrasound/tissue interface. Comparison Study: No prior studies. Performing Technologist: Oliver Hum RVT  Examination Guidelines: A complete evaluation includes B-mode imaging, spectral Doppler, color Doppler, and power Doppler as needed of all accessible portions of each vessel. Bilateral testing is considered an integral part of a complete examination. Limited examinations for reoccurring indications may be performed as noted. The reflux portion of the exam is performed with the patient in reverse Trendelenburg.  +---------+---------------+---------+-----------+----------+--------------+ RIGHT    CompressibilityPhasicitySpontaneityPropertiesThrombus Aging +---------+---------------+---------+-----------+----------+--------------+ CFV      Full           Yes      Yes                                 +---------+---------------+---------+-----------+----------+--------------+ SFJ      Full                                                        +---------+---------------+---------+-----------+----------+--------------+ FV Prox  Full                                                        +---------+---------------+---------+-----------+----------+--------------+  FV Mid   Full                                                        +---------+---------------+---------+-----------+----------+--------------+ FV DistalFull                                                        +---------+---------------+---------+-----------+----------+--------------+ PFV      Full                                                        +---------+---------------+---------+-----------+----------+--------------+ POP      Full           Yes      Yes                                 +---------+---------------+---------+-----------+----------+--------------+ PTV       Full                                                        +---------+---------------+---------+-----------+----------+--------------+ PERO     Full                                                        +---------+---------------+---------+-----------+----------+--------------+   +---------+---------------+---------+-----------+----------+--------------+ LEFT     CompressibilityPhasicitySpontaneityPropertiesThrombus Aging +---------+---------------+---------+-----------+----------+--------------+ CFV      Full           Yes      Yes                                 +---------+---------------+---------+-----------+----------+--------------+ SFJ      Full                                                        +---------+---------------+---------+-----------+----------+--------------+ FV Prox  Full                                                        +---------+---------------+---------+-----------+----------+--------------+ FV Mid                  Yes      Yes                                 +---------+---------------+---------+-----------+----------+--------------+  FV DistalFull                                                        +---------+---------------+---------+-----------+----------+--------------+ PFV      Full                                                        +---------+---------------+---------+-----------+----------+--------------+ POP      Full           Yes      Yes                                 +---------+---------------+---------+-----------+----------+--------------+ PTV      Full                                                        +---------+---------------+---------+-----------+----------+--------------+ PERO     Full                                                        +---------+---------------+---------+-----------+----------+--------------+     Summary: RIGHT: - There is no evidence of deep vein  thrombosis in the lower extremity.  - No cystic structure found in the popliteal fossa.  LEFT: - There is no evidence of deep vein thrombosis in the lower extremity. However, portions of this examination were limited- see technologist comments above.  - No cystic structure found in the popliteal fossa.  *See table(s) above for measurements and observations. Electronically signed by Monica Martinez MD on 03/22/2021 at 5:15:34 PM.    Final     Labs:  Basic Metabolic Panel: Recent Labs  Lab 03/26/21 0520  NA 134*  K 4.5  CL 100  CO2 28  GLUCOSE 160*  BUN 14  CREATININE 1.06  CALCIUM 8.7*    CBC: Recent Labs  Lab 03/26/21 0520 03/29/21 0752  WBC 8.7 8.7  NEUTROABS 5.4 5.9  HGB 7.9* 8.1*  HCT 24.2* 24.9*  MCV 91.0 93.3  PLT 155 193    CBG: Recent Labs  Lab 03/29/21 1642 03/29/21 2054 03/30/21 0603 03/30/21 1146 03/30/21 1701  GLUCAP 124* 138* 124* 162* 105*   Family history.  Positive for hypertension as well as hyperlipidemia.  Denies any colon cancer esophageal cancer or rectal cancer  Brief HPI:   DARIEN KADING is a 70 y.o. right-handed male with history of prostate cancer with laparoscopic radical prostatectomy 2014, PAD status post left SFA and stent 2016 obesity with BMI 29.92 diabetes mellitus hypertension, CAD with CABG 2014, tobacco abuse, CKD stage III.  Per chart review lives with spouse independent prior to admission.  1 level home 3 steps to entry.  Presented 03/18/2021 after mechanical fall no loss of conscious.  Denied any chest pain or shortness of breath  associated with the fall.  X-rays imaging revealed displaced left femoral neck fracture.  Underwent left total hip arthroplasty anterior approach 03/18/2021 per Dr. [name].  Weightbearing as tolerated.  Hospital course acute blood loss anemia 8.2.  Currently maintained on aspirin for CVA prophylaxis.  Therapy evaluations completed due to patient decreased functional mobility was admitted for a comprehensive  rehab program.   Hospital Course: ATZEL MCCAMBRIDGE was admitted to rehab 03/22/2021 for inpatient therapies to consist of PT, ST and OT at least three hours five days a week. Past admission physiatrist, therapy team and rehab RN have worked together to provide customized collaborative inpatient rehab.  Pertaining to patient's displaced left femoral neck fracture status post total hip arthroplasty anterior approach 03/18/2021 per Dr. [name].  Weightbearing as tolerated.  Neurovascular sensation intact.  Currently maintained on aspirin 81 mg twice daily as well as Plavix 75 mg for both DVT prophylaxis as well as history of CABG with aortic valve replacement September 2021.  He would continue aspirin twice daily x3 weeks then decrease to 81 mg daily and continue Plavix.  Pain management use of hydrocodone as well as Robaxin as needed Lidoderm patch was added for left rib pain.  Blood pressure controlled on Norvasc as well as Toprol.  ACE inhibitor is initially held due to uptrending creatinine and monitoring of chemistries.  Blood sugars controlled hemoglobin A1c 8.1 insulin therapy as directed patient had been on oral agents prior to admission could be resumed as tolerated.  PAD status post left SFA stenting 2016 patient had been cleared to resume Plavix.  He did have some mild sundowning urinalysis completed negative nitrite and culture less than 10,000 and significant growth.   Blood pressures were monitored on TID basis and controlled  Diabetes has been monitored with ac/hs CBG checks and SSI was use prn for tighter BS control.    Rehab course: During patient's stay in rehab weekly team conferences were held to monitor patient's progress, set goals and discuss barriers to discharge. At admission, patient required minimal assist 70 feet rolling walker minimal assist sit to stand min mod assist supine to sit  Physical exam.  Blood pressure 144/57 pulse 58 temperature 97.7 respirations 18 oxygen saturations  95% room air Constitutional.  No acute distress HEENT Head.  Normocephalic and atraumatic Eyes.  Pupils round and reactive to light no discharge.nystagmus Neck.  Supple nontender no JVD without thyromegaly Cardiac regular rate rhythm without extra sounds or murmur heard Abdomen.  Soft nontender positive bowel sounds without rebound Respiratory effort normal no respiratory distress without wheeze Skin.  Left hip incision dressed foam dressing over quarter size abrasion left elbow and knee Neurologic.  Alert oriented normal insight intact memory normal language and speech.  Cranial nerve exam unremarkable.  Upper extremity motor 5/5.  Left lower extremity limited by pain and 2-3/5 proximal to 4/5 distally in supine.  Right lower extremity 4-5/5 no sensory findings.  He/She  has had improvement in activity tolerance, balance, postural control as well as ability to compensate for deficits. He/She has had improvement in functional use RUE/LUE  and RLE/LLE as well as improvement in awareness.  Sessions with emphasis on functional mobility transfers generalized strengthening and dynamic standing balance.  Donned bilateral TED hose with total assist.  Transferred supine to sitting edge of bed supervision use of bed rails from flat bed and don shoes with max assist.  Sit to stand rolling walker contact-guard assist to pull pants over hips and ambulated with rolling  walker contact-guard assist.  Doffed shirt and donned to clean 1 with supervision.  Navigated 4 steps to handrails light minimal assist.  He was able to increase his ambulation up to 163 feet with rolling walker.  Ambulatory transfers to elevated toilet completed with contact-guard assist.  He did need some assistance with Peri care.  Full family teaching completed plan discharged home       Disposition: Discharged to home    Diet: Carb modified  Special Instructions: No driving smoking or alcohol  Weightbearing as  tolerated  Medications at discharge 1.  Tylenol as needed 2.  Norvasc 5 mg p.o. daily 3.  Aspirin 81 mg p.o. twice daily x3 weeks total and then aspirin daily 4.  Plavix 75 mg p.o. daily 5.  Colace 100 mg p.o. twice daily 6.  Pepcid 20 mg p.o. twice daily 7.  Glucotrol 5 mg p.o. twice daily 8.  Hydrocodone 1 to 2 tablets every 4 hours as needed pain 9.  Lantus insulin 8 units daily 10.  Lidoderm patch change as directed 11.  Melatonin 1.5 mg p.o. nightly 12.  Glucophage 1000 mg p.o. twice daily 13.  Robaxin 500 mg every 6 hours as needed muscle spasms 14.  Toprol-XL 25 mg p.o. daily 15.  Multivitamin daily 16.  Crestor 10 mg p.o. nightly  30-35 minutes were spent completing discharge summary and discharge planning      Follow-up Information     Jamse Arn, MD Follow up.   Specialty: Physical Medicine and Rehabilitation Why: No follow-up needed Contact information: [address] [place] [address] [phone]         Rod Can, MD Follow up.   Specialty: Orthopedic Surgery Why: Call for appointment Contact information: [address] [place] [address] [phone]                 Signed: Cathlyn Parsons 03/30/2021, 8:22 PM Patient was seen, face-face, and physical exam performed by me on day of discharge, greater than 30 minutes of total time spent.. Please see progress note from day of discharge as well.  Delice Lesch, MD, ABPMR

## 2021-03-29 NOTE — Progress Notes (Signed)
Occupational Therapy Session Note  Patient Details  Name: Dominic Mendoza MRN: [id number] Date of Birth: 05-23-1951  Today's Date: 03/29/2021 OT Individual Time: 1000-1045 OT Individual Time Calculation (min): 45 min    Short Term Goals: Week 1:  OT Short Term Goal 1 (Week 1): Patient will complete LB bathing/dressing min A using AE PRN. OT Short Term Goal 2 (Week 1): Patient will complete 3/3 toileting tasks with spvsn and LRAD. OT Short Term Goal 3 (Week 1): Patient will complete stand pivot transfers using LRAD with CGA. OT Short Term Goal 4 (Week 1): Patient will complete dynamic standing grooming task with LRAD with CGA. Skilled Therapeutic Interventions/Progress Updates:    Pt received supine in bed, agreeable to OT and reporting decreased but unrated pain in L hip. Pt requesting toileting; sup>sit close spvsn using bed rails with improved mobility. Sit<>stand with RW CGA. Pt ambulated with RW bed>bathroom>sink CGA-close spvsn and continent of bladder in stance at regular toilet with close spvsn. Pt completed standing grooming/oral hygiene at sink; education on RW management at home and recommendation of bag to carry items instead of in hands when using RW. Ted hose donned total A seated EOB; pt donned shoes min A to pull over L heel. Pt taken to day room to complete dynamic standing balance activity of putting (standing ~5 min) with anterior and lateral weight shifts without use of AD at mostly CGA level; demo'd one LOB when ambulating without RW to w/c but correcting min A. Pt self-propelled w/c ~100' with 1 rest break due to fatigue for BUE conditioning. Pt remained seated in w/c and handed off for PT session.   Therapy Documentation Precautions:  Precautions Precautions: Fall Restrictions Weight Bearing Restrictions: Yes LLE Weight Bearing: Weight bearing as tolerated Other Position/Activity Restrictions: WBAT Pain: Pain Assessment Pain Scale: 0-10 Pain Score:  (unrated, but  decreased) Pain Type: Acute pain Pain Location: Hip Pain Orientation: Left Pain Descriptors / Indicators: Discomfort;Tightness Pain Onset: With Activity Pain Intervention(s): Rest   Therapy/Group: Individual Therapy  Mellissa Kohut 03/29/2021, 12:28 PM

## 2021-03-30 LAB — GLUCOSE, CAPILLARY
Glucose-Capillary: 124 mg/dL — ABNORMAL HIGH (ref 70–99)
Glucose-Capillary: 162 mg/dL — ABNORMAL HIGH (ref 70–99)
Glucose-Capillary: 105 mg/dL — ABNORMAL HIGH (ref 70–99)
Glucose-Capillary: 152 mg/dL — ABNORMAL HIGH (ref 70–99)

## 2021-03-30 NOTE — Progress Notes (Signed)
Physical Therapy Discharge Summary  Patient Details  Name: Dominic Mendoza MRN: [id number] Date of Birth: 06/14/1951  Today's Date: 03/30/2021  Patient has met 7 of 8 long term goals due to improved activity tolerance, improved balance, increased strength, increased range of motion and decreased pain.  Patient to discharge at an ambulatory level Supervision.   Patient's care partner is independent to provide the necessary physical assistance at discharge.  Reasons goals not met: *Patient still with pain in L hip at site of incision and has limited strength so needing assistance to lift the leg for car transfers.  Recommendation:  Patient will benefit from ongoing skilled PT services in home health setting to continue to advance safe functional mobility, address ongoing impairments in balance, safety, L LE strength, and minimize fall risk.  Equipment: 3:1  Reasons for discharge: treatment goals met and discharge from hospital  Patient/family agrees with progress made and goals achieved: Yes  PT Discharge Precautions/Restrictions Precautions Precautions: Fall Restrictions Weight Bearing Restrictions: Yes LLE Weight Bearing: Weight bearing as tolerated Pain Pain Assessment Pain Scale: 0-10 Pain Score: 9  Pain Type: Acute pain Pain Location: Hip Pain Orientation: Left Pain Descriptors / Indicators: Aching Pain Onset: With Activity Pain Intervention(s): Medication (See eMAR) Vision/Perception  Perception Perception: Within Functional Limits Praxis Praxis: Intact  Cognition Overall Cognitive Status: Within Functional Limits for tasks assessed Arousal/Alertness: Awake/alert Attention: Sustained Memory: Impaired Memory Impairment: Retrieval deficit;Decreased short term memory Safety/Judgment: Appears intact Sensation Sensation Light Touch: Appears Intact Coordination Gross Motor Movements are Fluid and Coordinated: No Fine Motor Movements are Fluid and Coordinated:  Yes Coordination and Movement Description: Limited by L hip pain Motor  Motor Motor: Within Functional Limits Motor - Discharge Observations: slower movements, generalized weakness and still some pain L LE.  Mobility Bed Mobility Bed Mobility: Sit to Supine Rolling Right: Supervision/verbal cueing Rolling Left: Supervision/Verbal cueing Supine to Sit: Supervision/Verbal cueing Sit to Supine: Supervision/Verbal cueing Transfers Sit to Stand: Supervision/Verbal cueing Stand to Sit: Supervision/Verbal cueing Stand Pivot Transfers: Supervision/Verbal cueing Stand Pivot Transfer Details (indicate cue type and reason): occasional cues for hand placement Transfer (Assistive device): Rolling walker Locomotion  Gait Ambulation: Yes Gait Assistance: Supervision/Verbal cueing Gait Distance (Feet): 160 Feet Assistive device: Rolling walker Gait Assistance Details: Verbal cues for safe use of DME/AE Gait Gait: Yes Gait Pattern: Impaired Gait Pattern: Step-to pattern;Step-through pattern;Shuffle;Trunk flexed Stairs / Additional Locomotion Stairs: Yes Stairs Assistance: Supervision/Verbal cueing;Contact Guard/Touching assist Stair Management Technique: Two rails;With walker Number of Stairs: 4 Height of Stairs: 6 Ramp: Supervision/Verbal cueing Curb: Nurse, mental health Mobility: Yes Wheelchair Assistance: Chartered loss adjuster: Both upper extremities Wheelchair Parts Management: Needs assistance Distance: 130'  Trunk/Postural Assessment  Cervical Assessment Cervical Assessment: Exceptions to [facility] (forward head) Thoracic Assessment Thoracic Assessment: Exceptions to [facility] (rounded shoulders) Lumbar Assessment Lumbar Assessment: Exceptions to [facility] (posterior pelvic tilt, stiff throughout) Postural Control Postural Control: Within Functional Limits  Balance Balance Balance Assessed: Yes Standardized Balance  Assessment Standardized Balance Assessment: Timed Up and Go Test Timed Up and Go Test TUG: Normal TUG Normal TUG (seconds): 24.6 Static Sitting Balance Static Sitting - Balance Support: No upper extremity supported;Feet supported Static Sitting - Level of Assistance: 6: Modified independent (Device/Increase time) Dynamic Sitting Balance Dynamic Sitting - Balance Support: Feet supported;No upper extremity supported Dynamic Sitting - Level of Assistance: 5: Stand by assistance Static Standing Balance Static Standing - Balance Support: Right upper extremity supported;Left upper extremity supported Static Standing - Level of Assistance:  5: Stand by assistance Dynamic Standing Balance Dynamic Standing - Balance Support: Bilateral upper extremity supported;No upper extremity supported;During functional activity Dynamic Standing - Level of Assistance: 5: Stand by assistance Dynamic Standing - Balance Activities: Forward lean/weight shifting;Reaching for objects;Reaching across midline Extremity Assessment      RLE Assessment RLE Assessment: Within Functional Limits LLE Assessment LLE Assessment: Exceptions to [facility] Passive Range of Motion (PROM) Comments: WFL Active Range of Motion (AROM) Comments: hip flexion limited due to pain/weakness General Strength Comments: hip flexion 3-/5, knee extension 4/5, ankle DF 4+/5    Reginia Naas  Magda Kiel, PT 03/30/2021, 7:32 PM

## 2021-03-30 NOTE — Progress Notes (Signed)
Inpatient Rehabilitation Care Coordinator Discharge Note  The overall goal for the admission was met for:   Discharge location: Yes-HOME WITH WIFE WHO CAN PROVIDE 24/7 SUPERVISION LEVEL  Length of Stay: Yes-9 DAYS  Discharge activity level: Yes-SUPERVISION LEVEL  Home/community participation: Yes  Services provided included: MD, RD, PT, OT, SLP, RN, CM, Pharmacy, Neuropsych and SW  Financial Services: Medicare and Private Insurance: [place] offered to/list presented to:PT AND WIFE  Follow-up services arranged: Home Health: [place], DME: [facility]- 3 IN 1 and Patient/Family request agency HH: PREF [facility] HAS HAD BEFORE, DME: NO PREF  Comments (or additional information):WIFE WAS IN FOR EDUCATION AND IT WENT WELL AWARE OF 24/7 SUPERVISION NEEDS.  Patient/Family verbalized understanding of follow-up arrangements: Yes  Individual responsible for coordination of the follow-up plan: TRACEY-WIFE [phone]  Confirmed correct DME delivered: Elease Hashimoto 03/30/2021    Elease Hashimoto

## 2021-03-30 NOTE — Plan of Care (Signed)
  Problem: RH Balance Goal: LTG Patient will maintain dynamic standing balance (PT) Description: LTG:  Patient will maintain dynamic standing balance with assistance during mobility activities (PT) Outcome: Completed/Met   Problem: Sit to Stand Goal: LTG:  Patient will perform sit to stand with assistance level (PT) Description: LTG:  Patient will perform sit to stand with assistance level (PT) Outcome: Completed/Met   Problem: RH Bed Mobility Goal: LTG Patient will perform bed mobility with assist (PT) Description: LTG: Patient will perform bed mobility with assistance, with/without cues (PT). Outcome: Completed/Met   Problem: RH Bed to Chair Transfers Goal: LTG Patient will perform bed/chair transfers w/assist (PT) Description: LTG: Patient will perform bed to chair transfers with assistance (PT). Outcome: Completed/Met   Problem: RH Car Transfers Goal: LTG Patient will perform car transfers with assist (PT) Description: LTG: Patient will perform car transfers with assistance (PT). Outcome: Not Met (add Reason) Note: Needs assist for L LE   Problem: RH Ambulation Goal: LTG Patient will ambulate in controlled environment (PT) Description: LTG: Patient will ambulate in a controlled environment, # of feet with assistance (PT). Outcome: Completed/Met Goal: LTG Patient will ambulate in home environment (PT) Description: LTG: Patient will ambulate in home environment, # of feet with assistance (PT). Outcome: Completed/Met   Problem: RH Stairs Goal: LTG Patient will ambulate up and down stairs w/assist (PT) Description: LTG: Patient will ambulate up and down # of stairs with assistance (PT) Outcome: Completed/Met  Magda Kiel, PT

## 2021-03-30 NOTE — Progress Notes (Signed)
Physical Therapy Session Note  Patient Details  Name: Dominic Mendoza MRN: [id number] Date of Birth: September 21, 1951  Today's Date: 03/30/2021 PT Individual Time:Session1: 3419-6222; Arthor Captain: [id number] PT Individual Time Calculation (min): 56 min & 25 min  Short Term Goals: Week 1:  PT Short Term Goal 1 (Week 1): STG=LTG due to ELOS  Skilled Therapeutic Interventions/Progress Updates:    Session1:  Patient in supine and reports feeling well, but still same pain in L hip.  Patient supine to sit with S.  Donned shoes in sitting and sit to stand with S.  Ambulated to ortho gym with RW and S cues for posture and step through pattern.  Patient performed car transfer to simulated Jeep height with CGA to clear L foot into car.  Patient negotiated ramp with RW and S.  Pt. Ambulated to therapy gym.  Negotiated 4 steps with rails and close S, negotiated curb step with RW and S.  Patient ambulated 79' and sit to supine on mat with S.  Performed SAQ, hip abduction, heel slides, ankle pumps all x 10.  Supine to sit with S and increased time.  Patient ambulated to room x 150' with RW and S.  Left in recliner with seat belt alarm and call bell/needs in reach.   Lilly:  Patient in supine and performed supine to sit with S.  Donned shoes at EOB and sit to stand S.  Ambulated to ortho gym.  Performed standing task at [facility] with intermittent UE  Assist with S for tapping numbers 1-50 without LOB over 3.5 minutes.  Patient on Nu Step x 8 minutes level 3 UE/LE.  Ambulated back to room with S with RW.  Sit to supine with S and left with call bell and needs in reach and bed alarm active.   Therapy Documentation Precautions:  Precautions Precautions: Fall Restrictions Weight Bearing Restrictions: Yes LLE Weight Bearing: Weight bearing as tolerated Other Position/Activity Restrictions: WBAT Pain: Pain Assessment Pain Score: 7  Pain Type: Acute pain Pain Location: Hip Pain Orientation: Left Pain Descriptors /  Indicators: Aching Pain Onset: With Activity Pain Intervention(s): Distraction;Ambulation/increased activity   Therapy/Group: Individual Therapy  Reginia Naas  [place], [place] 03/30/2021, 6:03 PM

## 2021-03-30 NOTE — Anesthesia Postprocedure Evaluation (Signed)
Anesthesia Post Note  Patient: Dominic Mendoza  Procedure(s) Performed: TOTAL HIP ARTHROPLASTY ANTERIOR APPROACH (Left Hip)     Patient location during evaluation: PACU Anesthesia Type: General Level of consciousness: sedated and patient cooperative Pain management: pain level controlled Vital Signs Assessment: post-procedure vital signs reviewed and stable Respiratory status: spontaneous breathing Cardiovascular status: stable Anesthetic complications: no   No complications documented.  Last Vitals:  Vitals:   03/22/21 0525 03/22/21 1355  BP: (!) 144/57 (!) 141/63  Pulse: (!) 58 68  Resp: 16 18  Temp: 36.5 C 36.6 C  SpO2: 95% 99%    Last Pain:  Vitals:   03/22/21 1355  TempSrc: Oral  PainSc:                  Nolon Nations

## 2021-03-30 NOTE — Progress Notes (Signed)
Occupational Therapy Discharge Summary  Patient Details  Name: Dominic Mendoza MRN: [id number] Date of Birth: [birth year]  Today's Date: 03/30/2021 OT Individual Time: 0800-0900  &   1400-1500 OT Individual Time Calculation (min): 60 min   &  60 min   Patient has met 10 of 10 long term goals due to improved activity tolerance, improved balance, postural control, functional use of  LEFT lower extremity and improved coordination.  Patient to discharge at overall Supervision level.  Patient's care partner is independent to provide the necessary physical assistance at discharge.    Reasons goals not met: One goal at adequate for d/c level due to occasional need for CGA during ambulation for functional tasks for managing tight spaces with RW and slight unsteadiness on feet. Pt also demo's slight confusion and limited recall throughout hospital stay and requires min-mod verbal and demonstrative cues during new tasks. Overall pt has met or exceeded LTGs and with recommended spvsn during mobility and LB tasks, pt is prepared for safe d/c home.   Recommendation:  Patient will benefit from ongoing skilled OT services in home health setting to continue to advance functional skills in the area of BADL, iADL and Reduce care partner burden.  Equipment: BSC  Reasons for discharge: discharge from hospital  Patient/family agrees with progress made and goals achieved: Yes  Skilled Therapeutic Intervention Session 1: Pt received supine in bed, finishing breakfast, and agreeable to OT session with decreased pain in L hip. Pt completed BADLs as stated below in d/c using RW to ambulate between bed>shower and around room with occasional seated rest breaks. Pt education provided on fall prevention, energy conservation, and safety at home with pt demoing good understanding. Handouts provided. Pt demo'd no LOB during session and standing ADLs with RW. Pt remained seated in w/c ready for next PT session, all needs  met.  Session 2: Pt received supine in bed with son present, agreeable to OT and reporting unrated pain in LLE. Sup<>sit close spvsn, sit<>stand close spvsn with RW. Pt ambulated with RW at increased pace and smooth gait ~100' with no LOB.  Pt donned shoes min A to pull over heel. Pt engaged in simulated functional transfers (TTB and recliner) with son present for education on close spvsn level. Pt continues to demo some difficulty lifting LLE over into tub in tub transfer with TTB. Pt engaged in simple meal prep activity and education on safe RW management in kitchen; pt demo'd slight confusion with directions and stated he would not be doing any cooking when he gets back home. Pt engaged in BUE conditioning using UE ergometer for 6 minutes on level 1 and 1.5 resistance with one rest break. Pt engaged in dynamic standing balance task using BITS targeting sequencing alphabet and numbers 1-25; pt required mod vc's to correctly sequence alphabet with ~4-5 errors but demo'd no errors and increased speed to complete when sequencing numbers. Pt remained supine in bed, alarm set, call bell in reach, and all needs met.  OT Discharge Precautions/Restrictions  Precautions Precautions: Fall Restrictions Weight Bearing Restrictions: Yes LLE Weight Bearing: Weight bearing as tolerated Vital Signs Therapy Vitals Pulse Rate: 64 Pain Pain Assessment Pain Scale: 0-10 Pain Score:  (unrated but improved) Pain Type: Acute pain Pain Location: Hip Pain Orientation: Left Pain Descriptors / Indicators: Aching Pain Onset: With Activity Pain Intervention(s): Distraction;Rest ADL ADL Equipment Provided: Long-handled sponge,Reacher,Sock aid Eating: Set up Where Assessed-Eating: Chair Grooming: Supervision/safety Where Assessed-Grooming: Standing at sink Upper Body Bathing: Supervision/safety Where  Assessed-Upper Body Bathing: Shower,Other (Comment) (TTB) Lower Body Bathing: Supervision/safety Where  Assessed-Lower Body Bathing: Shower,Other (Comment) (TTB) Upper Body Dressing: Setup Where Assessed-Upper Body Dressing: Wheelchair,Edge of bed Lower Body Dressing: Supervision/safety Where Assessed-Lower Body Dressing: Chair,Edge of bed (with AE) Toileting: Supervision/safety Where Assessed-Toileting: Toilet (standing and seated) Toilet Transfer: Close supervision Toilet Transfer Method: Counselling psychologist: Grab bars,Bedside Neurosurgeon: Close supervison Clinical cytogeneticist Method: Ambulating,Stand pivot Insurance underwriter: Engineer, water: Close supervision Social research officer, government Method: Heritage manager: Transfer tub bench,Grab bars ADL Comments: Toileting/showering not assessed due to orthostatic vitals/symptomatic Vision Baseline Vision/History: Wears glasses Wears Glasses: At all times Patient Visual Report: No change from baseline Vision Assessment?: No apparent visual deficits Perception  Perception: Within Functional Limits Praxis Praxis: Intact Cognition Overall Cognitive Status: Within Functional Limits for tasks assessed Arousal/Alertness: Awake/alert Orientation Level: Oriented X4 Attention: Sustained Sustained Attention: Appears intact Memory: Impaired Memory Impairment: Retrieval deficit;Decreased short term memory Decreased Short Term Memory: Verbal basic Awareness: Appears intact Problem Solving: Appears intact Reasoning: Appears intact Sequencing: Appears intact Decision Making: Appears intact Safety/Judgment: Appears intact Sensation Sensation Light Touch: Appears Intact Hot/Cold: Appears Intact Proprioception: Appears Intact Stereognosis: Appears Intact Coordination Gross Motor Movements are Fluid and Coordinated: No Fine Motor Movements are Fluid and Coordinated: Yes Coordination and Movement Description: Limited by L hip pain Motor  Motor Motor:  Within Functional Limits Motor - Discharge Observations: slower movements, generalized weakness and still some pain LLE. Mobility  Bed Mobility Bed Mobility: Supine to Sit;Sit to Supine Rolling Right: Supervision/verbal cueing Rolling Left: Supervision/Verbal cueing Supine to Sit: Supervision/Verbal cueing Sit to Supine: Supervision/Verbal cueing Transfers Sit to Stand: Supervision/Verbal cueing Stand to Sit: Supervision/Verbal cueing  Trunk/Postural Assessment  Cervical Assessment Cervical Assessment: Within Functional Limits Thoracic Assessment Thoracic Assessment: Within Functional Limits Lumbar Assessment Lumbar Assessment: Within Functional Limits Postural Control Postural Control: Within Functional Limits  Balance Balance Balance Assessed: Yes Static Sitting Balance Static Sitting - Balance Support: No upper extremity supported;Feet supported Static Sitting - Level of Assistance: 5: Stand by assistance Dynamic Sitting Balance Dynamic Sitting - Balance Support: Feet supported;No upper extremity supported Dynamic Sitting - Level of Assistance: 5: Stand by assistance Dynamic Sitting - Balance Activities: Lateral lean/weight shifting;Forward lean/weight shifting;Reaching for objects;Reaching across midline;Ball toss;Trunk control activities Theatre stage manager Standing - Balance Support: Bilateral upper extremity supported;During functional activity;No upper extremity supported Static Standing - Level of Assistance: 5: Stand by assistance Dynamic Standing Balance Dynamic Standing - Balance Support: Bilateral upper extremity supported;No upper extremity supported;During functional activity Dynamic Standing - Level of Assistance: 5: Stand by assistance;4: Min assist Dynamic Standing - Balance Activities: Forward lean/weight shifting;Lateral lean/weight shifting;Reaching for objects Extremity/Trunk Assessment RUE Assessment RUE Assessment: Within Functional  Limits LUE Assessment LUE Assessment: Within Functional Limits   Mellissa Kohut 03/30/2021, 12:45 PM

## 2021-03-30 NOTE — Plan of Care (Signed)
  Problem: RH Toilet Transfers Goal: LTG Patient will perform toilet transfers w/assist (OT) Description: LTG: Patient will perform toilet transfers with assist, with/without cues using equipment (OT) Outcome: Adequate for Discharge Note: At times, pt needs CGA in tight spaces for RW management; but often fluctuates to close spvsn level.   Problem: Sit to Stand Goal: LTG:  Patient will perform sit to stand in prep for activites of daily living with assistance level (OT) Description: LTG:  Patient will perform sit to stand in prep for activites of daily living with assistance level (OT) Outcome: Completed/Met   Problem: RH Grooming Goal: LTG Patient will perform grooming w/assist,cues/equip (OT) Description: LTG: Patient will perform grooming with assist, with/without cues using equipment (OT) Outcome: Completed/Met   Problem: RH Bathing Goal: LTG Patient will bathe all body parts with assist levels (OT) Description: LTG: Patient will bathe all body parts with assist levels (OT) Outcome: Completed/Met   Problem: RH Dressing Goal: LTG Patient will perform upper body dressing (OT) Description: LTG Patient will perform upper body dressing with assist, with/without cues (OT). Outcome: Completed/Met Goal: LTG Patient will perform lower body dressing w/assist (OT) Description: LTG: Patient will perform lower body dressing with assist, with/without cues in positioning using equipment (OT) Outcome: Completed/Met Note: Spvsn with AE.   Problem: RH Toileting Goal: LTG Patient will perform toileting task (3/3 steps) with assistance level (OT) Description: LTG: Patient will perform toileting task (3/3 steps) with assistance level (OT)  Outcome: Completed/Met   Problem: RH Simple Meal Prep Goal: LTG Patient will perform simple meal prep w/assist (OT) Description: LTG: Patient will perform simple meal prep with assistance, with/without cues (OT). Outcome: Completed/Met   Problem: RH Light  Housekeeping Goal: LTG Patient will perform light housekeeping w/assist (OT) Description: LTG: Patient will perform light housekeeping with assistance, with/without cues (OT). Outcome: Completed/Met   Problem: RH Tub/Shower Transfers Goal: LTG Patient will perform tub/shower transfers w/assist (OT) Description: LTG: Patient will perform tub/shower transfers with assist, with/without cues using equipment (OT) Outcome: Completed/Met Note: May need CGA depending on how tight spaces are in pt bathroom at home.

## 2021-03-30 NOTE — Progress Notes (Signed)
[place] PHYSICAL MEDICINE & REHABILITATION PROGRESS NOTE  Subjective/Complaints: Patient seen sitting up working with therapy this morning.  States he slept well overnight.  He believes he is making progress.  He states his wife is surprised at the things he is able to do.  Discussed cognition with therapies, which appears to be improving.  ROS: Denies CP, SOB, N/V/D  Objective: Vital Signs: Blood pressure (!) 118/53, pulse (!) 50, temperature 98 F (36.7 C), resp. rate 18, height 5' 9.5" (1.765 m), weight 91.9 kg, SpO2 100 %. No results found. Recent Labs    03/29/21 0752  WBC 8.7  HGB 8.1*  HCT 24.9*  PLT 193   No results for input(s): NA, K, CL, CO2, GLUCOSE, BUN, CREATININE, CALCIUM in the last 72 hours.  Intake/Output Summary (Last 24 hours) at 03/30/2021 0854 Last data filed at 03/30/2021 0500 Gross per 24 hour  Intake 118 ml  Output 300 ml  Net -182 ml        Physical Exam: BP (!) 118/53 (BP Location: Left Arm)   Pulse (!) 50   Temp 98 F (36.7 C)   Resp 18   Ht 5' 9.5" (1.765 m)   Wt 91.9 kg   SpO2 100%   BMI 29.49 kg/m  Constitutional: No distress . Vital signs reviewed. HENT: Normocephalic.  Atraumatic. Eyes: EOMI. No discharge. Cardiovascular: No JVD.  RRR.  + Murmur Respiratory: Normal effort.  No stridor.  Bilateral clear to auscultation. GI: Non-distended.  BS +. Skin: Warm and dry.   Left elbow with dressing CDI Left knee abrasion healing Left hip incision with staples- no dressing in place- no erythema/drainage Psych: Normal mood.  Normal behavior. Musc: Left elbow hip knee with tenderness, improving Neuro: Alert Normal insight and awareness.  Motor: Bilateral upper extremities, right lower extremity: 5/5 proximal distal Left lower extremity: Hip flexion 2/5, knee extension 3-/5 (ongoing pain inhibition), ankle dorsiflexion 5/5, improving  Assessment/Plan: 1. Functional deficits which require 3+ hours per day of interdisciplinary therapy  in a comprehensive inpatient rehab setting.  Physiatrist is providing close team supervision and 24 hour management of active medical problems listed below.  Physiatrist and rehab team continue to assess barriers to discharge/monitor patient progress toward functional and medical goals   Care Tool:  Bathing    Body parts bathed by patient: Right arm,Left arm,Chest,Abdomen,Right upper leg,Left upper leg,Right lower leg,Face,Front perineal area,Buttocks,Left lower leg   Body parts bathed by helper: Left lower leg     Bathing assist Assist Level: Minimal Assistance - Patient > 75%     Upper Body Dressing/Undressing Upper body dressing   What is the patient wearing?: Pull over shirt    Upper body assist Assist Level: Minimal Assistance - Patient > 75%    Lower Body Dressing/Undressing Lower body dressing      What is the patient wearing?: Underwear/pull up,Pants     Lower body assist Assist for lower body dressing: Minimal Assistance - Patient > 75%     Toileting Toileting    Toileting assist Assist for toileting: Supervision/Verbal cueing (in stance)     Transfers Chair/bed transfer  Transfers assist  Chair/bed transfer activity did not occur: Safety/medical concerns (orthostatic vitals/symptomatic)  Chair/bed transfer assist level: Supervision/Verbal cueing     Locomotion Ambulation   Ambulation assist      Assist level: Supervision/Verbal cueing Assistive device: Walker-rolling Max distance: 150'   Walk 10 feet activity   Assist     Assist level: Supervision/Verbal cueing Assistive  device: Walker-rolling   Walk 50 feet activity   Assist    Assist level: Supervision/Verbal cueing Assistive device: Walker-rolling    Walk 150 feet activity   Assist Walk 150 feet activity did not occur: Safety/medical concerns  Assist level: Supervision/Verbal cueing Assistive device: Walker-rolling    Walk 10 feet on uneven surface   activity   Assist Walk 10 feet on uneven surfaces activity did not occur: Safety/medical concerns   Assist level: Contact Guard/Touching assist Assistive device: Aeronautical engineer Will patient use wheelchair at discharge?: No Type of Wheelchair: Manual    Wheelchair assist level: Supervision/Verbal cueing Max wheelchair distance: 115'    Wheelchair 50 feet with 2 turns activity    Assist        Assist Level: Supervision/Verbal cueing   Wheelchair 150 feet activity     Assist  Wheelchair 150 feet activity did not occur: Safety/medical concerns       BP (!) 118/53 (BP Location: Left Arm)   Pulse (!) 50   Temp 98 F (36.7 C)   Resp 18   Ht 5' 9.5" (1.765 m)   Wt 91.9 kg   SpO2 100%   BMI 29.49 kg/m    Medical Problem List and Plan: 1.Debilitysecondary to displaced left femoral neck fracture. 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 [name]. Weightbearing as tolerated  Continue CIR  Plan to DC staples prior to discharge 2. Antithrombotics: -DVT/anticoagulation:SCDs -antiplatelet therapy: Aspirin 81 mg twice daily for DVT prophylaxis x3 weeks then aspirin 81 mg daily and Plavix 75 mg daily.  Vascular study negative for DVT 3. Pain Management:Hydrocodone/Robaxin as needed Added lidocaine patch for left rib pain  Pt would like to try kpad for rib/hip pain  Controlled with meds on 5/31  Monitor with increased exertion 4. Mood:Provide emotional support -antipsychotic agents: N/A 5. Neuropsych: This patientiscapable of making decisions on hisown behalf. 6. Skin/Wound Care:continue post-op dressing for now, no visible drainage from wound -foam dressings to protect left elbow and knee abrasions. 7. Fluids/Electrolytes/Nutrition:encouraged PO  -will ask RD for recs  8. Acute blood loss anemia.   Hemoglobin 8.1 on  5/30 9. Hypertension. Norvasc 5 mg daily, Toprol-XL 25 mg daily. ACE inhibitors held due to uptrending creatinine.   Relatively controlled on 5/31 10. Diabetes mellitus with hyperglycemia. Hemoglobin A1c 8.1. Lantus insulin 8 units daily. Patient on Glucotrol 5 mg twice daily and Glucophage 1000 mg twice daily as well as Actos 15 mg daily prior to admission.   Glucophage 500mg  bid started, increased to 1000 twice daily on 5/27             -add actos as needed  Glucotrol added on 5/25  Mildly elevated on 5/31   CBG (last 3)  Recent Labs    03/29/21 1642 03/29/21 2054 03/30/21 0603  GLUCAP 124* 138* 124*   11.History of CAD/CABG/AVR. Patient on aspirin 81 mg daily and Plavix 75 mg daily prior to admission.  12. 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   Creatinine 1.06 on 5/27 13. PAD status post left SFA and stent 2016.Patient has been cleared to resume Plavix 14.  Hyponatremia  Sodium 134 on 5/27  Continue to monitor 15.  Sundowning  UA/urine culture with insignificant growth  Melatonin ordered  ECG reviewed, borderline within normal limits for the most part trazodone ordered  Improving  LOS: 8 days A FACE TO FACE EVALUATION WAS PERFORMED  Larry Alcock Lorie Phenix 03/30/2021, 8:54 AM

## 2021-03-31 ENCOUNTER — Other Ambulatory Visit (HOSPITAL_COMMUNITY): Payer: Self-pay

## 2021-03-31 LAB — GLUCOSE, CAPILLARY: Glucose-Capillary: 75 mg/dL (ref 70–99)

## 2021-03-31 MED ORDER — AMLODIPINE BESYLATE 5 MG PO TABS: 5.0000 mg | ORAL_TABLET | Freq: Every day | ORAL | 0 refills | Status: AC

## 2021-03-31 MED ORDER — MELATONIN 3 MG PO TABS: 1.5000 mg | ORAL_TABLET | Freq: Every day | ORAL | Status: AC

## 2021-03-31 MED ORDER — CARETOUCH PEN NEEDLES 31G X 5 MM MISC: 1.0000 "application " | Freq: Every day | 0 refills | Status: AC

## 2021-03-31 MED ORDER — ACETAMINOPHEN 325 MG PO TABS: 325.0000 mg | ORAL_TABLET | Freq: Four times a day (QID) | ORAL | Status: AC | PRN

## 2021-03-31 MED ORDER — LIDOCAINE 5 % EX PTCH: MEDICATED_PATCH | CUTANEOUS | 0 refills | Status: AC

## 2021-03-31 MED ORDER — DOCUSATE SODIUM 100 MG PO CAPS: 100.0000 mg | ORAL_CAPSULE | Freq: Two times a day (BID) | ORAL | 0 refills | Status: AC

## 2021-03-31 MED ORDER — METFORMIN HCL ER 500 MG PO TB24: 1000.0000 mg | ORAL_TABLET | Freq: Two times a day (BID) | ORAL | 0 refills | Status: AC

## 2021-03-31 MED ORDER — TRAZODONE HCL 50 MG PO TABS: 50.0000 mg | ORAL_TABLET | Freq: Every day | ORAL | 0 refills | Status: DC

## 2021-03-31 MED ORDER — BASAGLAR KWIKPEN 100 UNIT/ML ~~LOC~~ SOPN: 8.0000 [IU] | PEN_INJECTOR | Freq: Every day | SUBCUTANEOUS | 0 refills | Status: AC

## 2021-03-31 MED ORDER — SENNA 8.6 MG PO TABS: 1.0000 | ORAL_TABLET | Freq: Every day | ORAL | 0 refills | Status: AC

## 2021-03-31 MED ORDER — ADULT MULTIVITAMIN W/MINERALS CH: 1.0000 | ORAL_TABLET | Freq: Every day | ORAL | Status: AC

## 2021-03-31 MED ORDER — METHOCARBAMOL 500 MG PO TABS: 500.0000 mg | ORAL_TABLET | Freq: Four times a day (QID) | ORAL | 0 refills | Status: DC | PRN

## 2021-03-31 MED ORDER — HYDROCODONE-ACETAMINOPHEN 5-325 MG PO TABS: 1.0000 | ORAL_TABLET | Freq: Four times a day (QID) | ORAL | 0 refills | Status: DC | PRN

## 2021-03-31 MED ORDER — METOPROLOL SUCCINATE ER 25 MG PO TB24: 25.0000 mg | ORAL_TABLET | Freq: Every day | ORAL | 0 refills | Status: AC

## 2021-03-31 MED ORDER — INSULIN GLARGINE 100 UNIT/ML SOLOSTAR PEN: 8.0000 [IU] | PEN_INJECTOR | Freq: Every day | SUBCUTANEOUS | 0 refills | Status: DC

## 2021-03-31 MED ORDER — LIDOCAINE 5 % EX PTCH: MEDICATED_PATCH | CUTANEOUS | 0 refills | Status: DC

## 2021-03-31 MED ORDER — HYDROCODONE-ACETAMINOPHEN 5-325 MG PO TABS: 1.0000 | ORAL_TABLET | Freq: Four times a day (QID) | ORAL | 0 refills | Status: AC | PRN

## 2021-03-31 NOTE — Discharge Instructions (Signed)
Inpatient Rehab Discharge Instructions  Dominic Mendoza Discharge date and time: 03/31/21   Activities/Precautions/ Functional Status: Activity: activity as tolerated Diet: diabetic diet Wound Care:Keep incision clean and dry. Contact MD if you develop any problems with your incision/wound--redness, swelling, increase in pain, drainage or if you develop fever or chills.   Functional status:  ___ No restrictions     ___ Walk up steps independently _X__ 24/7 supervision/assistance   ___ Walk up steps with assistance ___ Intermittent supervision/assistance  ___ Bathe/dress independently ___ Walk with walker     _X__ Bathe/dress with assistance ___ Walk Independently    ___ Shower independently ___ Walk with assistance    ___ Shower with assistance _X__ No alcohol     ___ Return to work/school ________  Special Instructions: 1. No driving, smoking or alcohol   COMMUNITY REFERRALS UPON DISCHARGE:    Home Health:   [facility] Phone: [phone]  Medical Equipment/Items Ordered: 3 IN 1                                                  Agency/Supplier:[facility]  [phone]   My questions have been answered and I understand these instructions. I will adhere to these goals and the provided educational materials after my discharge from the hospital.  Patient/Caregiver Signature _______________________________ Date __________  Clinician Signature _______________________________________ Date __________  Please bring this form and your medication list with you to all your follow-up doctor's appointments.

## 2021-03-31 NOTE — Progress Notes (Signed)
[place] PHYSICAL MEDICINE & REHABILITATION PROGRESS NOTE  Subjective/Complaints: Patient seen laying in bed this morning.  He states he slept well overnight.  He is ready for discharge.  ROS: Denies CP, SOB, N/V/D  Objective: Vital Signs: Blood pressure (!) 132/54, pulse 61, temperature 98 F (36.7 C), resp. rate 17, height 5' 9.5" (1.765 m), weight 91.9 kg, SpO2 98 %. No results found. Recent Labs    03/29/21 0752  WBC 8.7  HGB 8.1*  HCT 24.9*  PLT 193   No results for input(s): NA, K, CL, CO2, GLUCOSE, BUN, CREATININE, CALCIUM in the last 72 hours.  Intake/Output Summary (Last 24 hours) at 03/31/2021 0955 Last data filed at 03/31/2021 4081 Gross per 24 hour  Intake 540 ml  Output 1990 ml  Net -1450 ml        Physical Exam: BP (!) 132/54   Pulse 61   Temp 98 F (36.7 C)   Resp 17   Ht 5' 9.5" (1.765 m)   Wt 91.9 kg   SpO2 98%   BMI 29.49 kg/m  Constitutional: No distress . Vital signs reviewed. HENT: Normocephalic.  Atraumatic. Eyes: EOMI. No discharge. Cardiovascular: No JVD.  RRR.  + Murmur. Respiratory: Normal effort.  No stridor.  Bilateral clear to auscultation. GI: Non-distended.  BS +. Skin: Warm and dry.   Left elbow with dressing CDI Left knee abrasion healing Left hip incision with staples- no dressing in place- no erythema/drainage Psych: Normal mood.  Normal behavior. Musc: Left elbow hip knee with tenderness, stable Neuro: Alert Normal insight and awareness.  Motor: Bilateral upper extremities, right lower extremity: 5/5 proximal distal Left lower extremity: Hip flexion 2/5, knee extension 3-/5 (ongoing pain inhibition), ankle dorsiflexion 5/5, improving  Assessment/Plan: 1. Functional deficits which require 3+ hours per day of interdisciplinary therapy in a comprehensive inpatient rehab setting.  Physiatrist is providing close team supervision and 24 hour management of active medical problems listed below.  Physiatrist and rehab team  continue to assess barriers to discharge/monitor patient progress toward functional and medical goals   Care Tool:  Bathing    Body parts bathed by patient: Right arm,Left arm,Chest,Abdomen,Right upper leg,Left upper leg,Right lower leg,Face,Front perineal area,Buttocks,Left lower leg   Body parts bathed by helper: Left lower leg     Bathing assist Assist Level: Supervision/Verbal cueing     Upper Body Dressing/Undressing Upper body dressing   What is the patient wearing?: Pull over shirt    Upper body assist Assist Level: Set up assist    Lower Body Dressing/Undressing Lower body dressing      What is the patient wearing?: Underwear/pull up,Pants     Lower body assist Assist for lower body dressing: Supervision/Verbal cueing     Toileting Toileting    Toileting assist Assist for toileting: Supervision/Verbal cueing     Transfers Chair/bed transfer  Transfers assist  Chair/bed transfer activity did not occur: Safety/medical concerns (orthostatic vitals/symptomatic)  Chair/bed transfer assist level: Supervision/Verbal cueing     Locomotion Ambulation   Ambulation assist      Assist level: Supervision/Verbal cueing Assistive device: Walker-rolling Max distance: 150'   Walk 10 feet activity   Assist     Assist level: Supervision/Verbal cueing Assistive device: Walker-rolling   Walk 50 feet activity   Assist    Assist level: Supervision/Verbal cueing Assistive device: Walker-rolling    Walk 150 feet activity   Assist Walk 150 feet activity did not occur: Safety/medical concerns  Assist level: Supervision/Verbal cueing Assistive  device: Walker-rolling    Walk 10 feet on uneven surface  activity   Assist Walk 10 feet on uneven surfaces activity did not occur: Safety/medical concerns   Assist level: Supervision/Verbal cueing Assistive device: Aeronautical engineer Will patient use wheelchair at discharge?:  No Type of Wheelchair: Manual    Wheelchair assist level: Supervision/Verbal cueing Max wheelchair distance: 120'    Wheelchair 50 feet with 2 turns activity    Assist        Assist Level: Supervision/Verbal cueing   Wheelchair 150 feet activity     Assist  Wheelchair 150 feet activity did not occur: Safety/medical concerns       BP (!) 132/54   Pulse 61   Temp 98 F (36.7 C)   Resp 17   Ht 5' 9.5" (1.765 m)   Wt 91.9 kg   SpO2 98%   BMI 29.49 kg/m    Medical Problem List and Plan: 1.Debilitysecondary to displaced left femoral neck fracture. 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 [name]. Weightbearing as tolerated  DC today  DC staples 2. Antithrombotics: -DVT/anticoagulation:SCDs -antiplatelet therapy: Aspirin 81 mg twice daily for DVT prophylaxis x3 weeks then aspirin 81 mg daily and Plavix 75 mg daily.  Vascular study negative for DVT 3. Pain Management:Hydrocodone/Robaxin as needed Added lidocaine patch for left rib pain  Pt would like to try kpad for rib/hip pain  Controlled with meds on 6/1  Monitor with increased exertion 4. Mood:Provide emotional support -antipsychotic agents: N/A 5. Neuropsych: This patientiscapable of making decisions on hisown behalf. 6. Skin/Wound Care:continue post-op dressing for now, no visible drainage from wound -foam dressings to protect left elbow and knee abrasions. 7. Fluids/Electrolytes/Nutrition:encouraged PO  -will ask RD for recs  8. Acute blood loss anemia.   Hemoglobin 8.1 on 5/30 9. Hypertension. Norvasc 5 mg daily, Toprol-XL 25 mg daily. ACE inhibitors held due to uptrending creatinine.   Controlled on 6/1 10. Diabetes mellitus with hyperglycemia. Hemoglobin A1c 8.1. Lantus insulin 8 units daily. Patient on Glucotrol 5 mg twice daily and Glucophage 1000 mg twice daily as well as Actos  15 mg daily prior to admission.   Glucophage 500mg  bid started, increased to 1000 twice daily on 5/27             -add actos as needed  Glucotrol added on 5/25  Slightly labile on 6/1, monitor for trend   CBG (last 3)  Recent Labs    03/30/21 1701 03/30/21 2107 03/31/21 0630  GLUCAP 105* 152* 75   11.History of CAD/CABG/AVR. Patient on aspirin 81 mg daily and Plavix 75 mg daily prior to admission.  12. 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   Creatinine 1.06 on 5/27 13. PAD status post left SFA and stent 2016.Patient has been cleared to resume Plavix 14.  Hyponatremia  Sodium 134 on 5/27  Continue to monitor 15.  Sundowning  UA/urine culture with insignificant growth  Melatonin ordered  ECG reviewed, borderline within normal limits for the most part trazodone ordered  Improving  > 30 minutes spent in total in discharge planning between myself and PA regarding aforementioned, as well discussion regarding DME equipment, follow-up appointments, follow-up therapies, discharge medications, discharge recommendations, answering questions.  Please see discharge summary as well.  LOS: 9 days A FACE TO FACE EVALUATION WAS PERFORMED  Dominic Mendoza Dominic Mendoza 03/31/2021, 9:55 AM

## 2021-03-31 NOTE — Progress Notes (Signed)
Staples removed from pt left hip incision. Slight bleeding noted. Incision approximated. Pt tolerated well.  Pt/family educated on use of insulin pen use. Discussed diabetes management, diet, and handout given. Questions answered.  Sheela Stack, LPN

## 2021-03-31 NOTE — Progress Notes (Signed)
INPATIENT REHABILITATION DISCHARGE NOTE   Discharge instructions by: Reesa Chew PA-C  Verbalized understanding: Yes  Skin care/Wound care: Staples removed, dressings changed.  Pain: 0  IV's: None  Tubes/Drains: None  Safety instructions: Explained  Patient belongings: Carloyn Jaeger, returned to pt  Discharged to: Home  Discharged via: Private vehicle.  Sheela Stack, LPN

## 2021-04-02 ENCOUNTER — Telehealth: Payer: Self-pay

## 2021-04-02 NOTE — Telephone Encounter (Signed)
Lidocaine patches approved. Pharmacy notified.

## 2021-04-11 ENCOUNTER — Emergency Department (HOSPITAL_COMMUNITY)
Admission: EM | Admit: 2021-04-11 | Discharge: 2021-04-12 | Disposition: A | Discharge status: Home / Self Care | Payer: Medicare Other | Attending: Emergency Medicine | Admitting: Emergency Medicine

## 2021-04-11 ENCOUNTER — Other Ambulatory Visit: Payer: Self-pay

## 2021-04-11 DIAGNOSIS — Z7984 Long term (current) use of oral hypoglycemic drugs: Secondary | ICD-10-CM | POA: Insufficient documentation

## 2021-04-11 DIAGNOSIS — R319 Hematuria, unspecified: Secondary | ICD-10-CM | POA: Diagnosis present

## 2021-04-11 DIAGNOSIS — I129 Hypertensive chronic kidney disease with stage 1 through stage 4 chronic kidney disease, or unspecified chronic kidney disease: Secondary | ICD-10-CM | POA: Insufficient documentation

## 2021-04-11 DIAGNOSIS — N182 Chronic kidney disease, stage 2 (mild): Secondary | ICD-10-CM | POA: Insufficient documentation

## 2021-04-11 DIAGNOSIS — Z79899 Other long term (current) drug therapy: Secondary | ICD-10-CM | POA: Diagnosis not present

## 2021-04-11 DIAGNOSIS — Z7902 Long term (current) use of antithrombotics/antiplatelets: Secondary | ICD-10-CM | POA: Insufficient documentation

## 2021-04-11 DIAGNOSIS — Z87891 Personal history of nicotine dependence: Secondary | ICD-10-CM | POA: Diagnosis not present

## 2021-04-11 DIAGNOSIS — I2510 Atherosclerotic heart disease of native coronary artery without angina pectoris: Secondary | ICD-10-CM | POA: Diagnosis not present

## 2021-04-11 DIAGNOSIS — Z794 Long term (current) use of insulin: Secondary | ICD-10-CM | POA: Diagnosis not present

## 2021-04-11 DIAGNOSIS — N23 Unspecified renal colic: Secondary | ICD-10-CM | POA: Diagnosis not present

## 2021-04-11 DIAGNOSIS — Z7982 Long term (current) use of aspirin: Secondary | ICD-10-CM | POA: Insufficient documentation

## 2021-04-11 DIAGNOSIS — Z96642 Presence of left artificial hip joint: Secondary | ICD-10-CM | POA: Diagnosis not present

## 2021-04-11 DIAGNOSIS — Z8546 Personal history of malignant neoplasm of prostate: Secondary | ICD-10-CM | POA: Insufficient documentation

## 2021-04-11 DIAGNOSIS — E1122 Type 2 diabetes mellitus with diabetic chronic kidney disease: Secondary | ICD-10-CM | POA: Diagnosis not present

## 2021-04-11 DIAGNOSIS — Z951 Presence of aortocoronary bypass graft: Secondary | ICD-10-CM | POA: Insufficient documentation

## 2021-04-11 DIAGNOSIS — R310 Gross hematuria: Secondary | ICD-10-CM

## 2021-04-11 LAB — COMPREHENSIVE METABOLIC PANEL
ALT: 11 U/L (ref 0–44)
AST: 12 U/L — ABNORMAL LOW (ref 15–41)
Albumin: 3.8 g/dL (ref 3.5–5.0)
Alkaline Phosphatase: 119 U/L (ref 38–126)
Anion gap: 10 (ref 5–15)
BUN: 20 mg/dL (ref 8–23)
CO2: 25 mmol/L (ref 22–32)
Calcium: 9.6 mg/dL (ref 8.9–10.3)
Chloride: 104 mmol/L (ref 98–111)
Creatinine, Ser: 1.17 mg/dL (ref 0.61–1.24)
GFR, Estimated: >60 mL/min (ref >60)
Glucose, Bld: 132 mg/dL — ABNORMAL HIGH (ref 70–99)
Potassium: 4.7 mmol/L (ref 3.5–5.1)
Sodium: 139 mmol/L (ref 135–145)
Total Bilirubin: 0.6 mg/dL (ref 0.3–1.2)
Total Protein: 7.9 g/dL (ref 6.5–8.1)

## 2021-04-11 LAB — CBC
HCT: 30.5 % — ABNORMAL LOW (ref 39.0–52.0)
Hemoglobin: 9.5 g/dL — ABNORMAL LOW (ref 13.0–17.0)
MCH: 29.5 pg (ref 26.0–34.0)
MCHC: 31.1 g/dL (ref 30.0–36.0)
MCV: 94.7 fL (ref 80.0–100.0)
Platelets: 210 10*3/uL (ref 150–400)
RBC: 3.22 MIL/uL — ABNORMAL LOW (ref 4.22–5.81)
RDW: 15.2 % (ref 11.5–15.5)
WBC: 10.3 10*3/uL (ref 4.0–10.5)
nRBC: 0 % (ref 0.0–0.2)

## 2021-04-11 NOTE — ED Triage Notes (Signed)
Pt came in with c/o hematuria. Pt states it started this morning. He states that the tip of his penis is burning. Pt has hx of prostatectomy. Pt recently had hip surgery 5/25

## 2021-04-12 ENCOUNTER — Emergency Department (HOSPITAL_COMMUNITY): Payer: Medicare Other

## 2021-04-12 DIAGNOSIS — N23 Unspecified renal colic: Secondary | ICD-10-CM | POA: Diagnosis not present

## 2021-04-12 LAB — URINALYSIS, ROUTINE W REFLEX MICROSCOPIC
Bacteria, UA: NONE SEEN
Bilirubin Urine: NEGATIVE
Glucose, UA: NEGATIVE mg/dL
Ketones, ur: NEGATIVE mg/dL
Nitrite: NEGATIVE
Protein, ur: 100 mg/dL — AB
RBC / HPF: >50 RBC/hpf — ABNORMAL HIGH (ref 0–5)
Specific Gravity, Urine: 1.018 (ref 1.005–1.030)
pH: 5 (ref 5.0–8.0)

## 2021-04-12 MED ORDER — ONDANSETRON HCL 4 MG/2ML IJ SOLN
4.0000 mg | Freq: Once | INTRAMUSCULAR | Status: AC
  Administered 2021-04-12: 4 mg via INTRAVENOUS
  Filled 2021-04-12: qty 2

## 2021-04-12 MED ORDER — CEPHALEXIN 500 MG PO CAPS: 500.0000 mg | ORAL_CAPSULE | Freq: Two times a day (BID) | ORAL | 0 refills | Status: AC

## 2021-04-12 MED ORDER — FENTANYL CITRATE (PF) 100 MCG/2ML IJ SOLN
100.0000 ug | Freq: Once | INTRAMUSCULAR | Status: AC
  Administered 2021-04-12: 100 ug via INTRAVENOUS
  Filled 2021-04-12: qty 2

## 2021-04-12 MED ORDER — SODIUM CHLORIDE 0.9 % IV SOLN
1.0000 g | Freq: Once | INTRAVENOUS | Status: AC
  Administered 2021-04-12: 1 g via INTRAVENOUS
  Filled 2021-04-12: qty 10

## 2021-04-12 MED ORDER — MORPHINE SULFATE (PF) 4 MG/ML IV SOLN
4.0000 mg | Freq: Once | INTRAVENOUS | Status: AC
  Administered 2021-04-12: 4 mg via INTRAMUSCULAR
  Filled 2021-04-12: qty 1

## 2021-04-12 NOTE — ED Notes (Signed)
Bladder scan = 58ml.  Christy Gentles, MD notified.

## 2021-04-12 NOTE — ED Notes (Signed)
Pt ambulatory to the bathroom with walker and steady gait

## 2021-04-12 NOTE — ED Notes (Signed)
Pt transported to CT ?

## 2021-04-12 NOTE — ED Notes (Signed)
Pt verbalized understanding of d/c, medication, and follow up care. Ambulatory with assistive device

## 2021-04-12 NOTE — ED Provider Notes (Signed)
[facility] DEPT Provider Note   CSN: [id number] Arrival date & time: 04/11/21  2139     History Chief Complaint  Patient presents with   Hematuria    Dominic Mendoza is a 70 y.o. male.  The history is provided by the patient and the spouse.  Hematuria This is a new problem. The current episode started 12 to 24 hours ago. The problem has been gradually worsening. Pertinent negatives include no abdominal pain. Exacerbated by: urination. Nothing relieves the symptoms.  Patient with history of diabetes, hypertension, previous prostatectomy presents with hematuria and dysuria.  This is started over the past day.  He reports that with his penis is burning.  He reports he recently had hip surgery on May 25. Patient takes aspirin and Plavix daily    Past Medical History:  Diagnosis Date   Arthritis    Cancer ([place])    prostate   Diabetes mellitus without complication ([place])    Dyspnea    GERD (gastroesophageal reflux disease)    Hypertension    Smokers' cough [facility])     Patient Active Problem List   Diagnosis Date Noted   Hyponatremia    Uncontrolled type 2 diabetes mellitus with hyperglycemia ([place])    Essential hypertension    Acute blood loss anemia    Postoperative pain    Orthostatic hypotension    Left displaced femoral neck fracture ([place]) 03/22/2021   Closed left hip fracture ([place]) 03/18/2021   Hypertension    Diabetes mellitus without complication (H[facility])    CKD (chronic kidney disease), stage II    CAD (coronary artery disease)    History of CVA (cerebrovascular accident)     Past Surgical History:  Procedure Laterality Date   COLONOSCOPY     CORONARY ARTERY BYPASS GRAFT     triple   LYMPHADENECTOMY Bilateral 02/25/2013   Procedure: LYMPHADENECTOMY;  Surgeon: Dutch Gray, MD;  Location: [facility];  Service: Urology;  Laterality: Bilateral;  Bilateral Pelvic    ROBOT ASSISTED LAPAROSCOPIC RADICAL PROSTATECTOMY N/A 02/25/2013   Procedure:  ROBOTIC ASSISTED LAPAROSCOPIC RADICAL PROSTATECTOMY LEVEL 2;  Surgeon: Dutch Gray, MD;  Location: [facility];  Service: Urology;  Laterality: N/A;   stent left leg     TOTAL HIP ARTHROPLASTY Left 03/18/2021   Procedure: TOTAL HIP ARTHROPLASTY ANTERIOR APPROACH;  Surgeon: Rod Can, MD;  Location: [facility];  Service: Orthopedics;  Laterality: Left;       No family history on file.  Social History   Tobacco Use   Smoking status: Former    Packs/day: 2.00    Years: 31.00    Pack years: 62.00    Types: Cigarettes   Smokeless tobacco: Never   Tobacco comments:    July 27, 2021  Vaping Use   Vaping Use: Never used  Substance Use Topics   Alcohol use: No   Drug use: No    Home Medications Prior to Admission medications   Medication Sig Start Date End Date Taking? Authorizing Provider  acetaminophen (TYLENOL) 325 MG tablet Take 1-2 tablets (325-650 mg total) by mouth every 6 (six) hours as needed for mild pain. 03/31/21   Love, Ivan Anchors, PA-C  amLODipine (NORVASC) 5 MG tablet Take 1 tablet (5 mg total) by mouth daily. 03/31/21   Love, Ivan Anchors, PA-C  aspirin (ASPIRIN CHILDRENS) 81 MG chewable tablet Chew 1 tablet (81 mg total) by mouth 2 (two) times daily with a meal. 03/22/21 05/06/21  Dorothyann Peng, PA  clopidogrel (PLAVIX) 75 MG tablet Take 75 mg by mouth daily.    [provider]  docusate sodium (COLACE) 100 MG capsule Take 1 capsule (100 mg total) by mouth 2 (two) times daily. PURCHASE OVER THE COUNTER--OTC 03/31/21   Love, Ivan Anchors, PA-C  famotidine (PEPCID) 20 MG tablet Take 20 mg by mouth 2 (two) times daily. 01/06/21   [provider]  glipiZIDE (GLUCOTROL) 5 MG tablet Take 5 mg by mouth 2 (two) times daily. 02/06/21   [provider]  Insulin Glargine (BASAGLAR KWIKPEN) 100 UNIT/ML Inject 8 Units into the skin daily. 03/31/21   Love, Ivan Anchors, PA-C  Insulin Pen Needle (CARETOUCH PEN NEEDLES) 31G X 5 MM MISC 1 application by Does not apply route daily.  03/31/21   Love, Ivan Anchors, PA-C  lidocaine (LIDODERM) 5 % APPLY AT 7 PM AND REMOVE AT 7 AM DAILY--NEED TO PURCHASE OVER THE COUNTER IF INSURANCE DOES NOT  COVER IT. 03/31/21   Love, Ivan Anchors, PA-C  melatonin 3 MG TABS tablet Take 0.5 tablets (1.5 mg total) by mouth at bedtime. PURCHASE OVER THE COUNTER 03/31/21   Love, Ivan Anchors, PA-C  metFORMIN (GLUCOPHAGE-XR) 500 MG 24 hr tablet Take 2 tablets (1,000 mg total) by mouth 2 (two) times daily. 03/31/21   Love, Ivan Anchors, PA-C  methocarbamol (ROBAXIN) 500 MG tablet Take 1 tablet (500 mg total) by mouth every 6 (six) hours as needed for muscle spasms. 03/31/21   Love, Ivan Anchors, PA-C  metoprolol succinate (TOPROL-XL) 25 MG 24 hr tablet Take 1 tablet (25 mg total) by mouth daily. 03/31/21   Love, Ivan Anchors, PA-C  Multiple Vitamin (MULTIVITAMIN WITH MINERALS) TABS tablet Take 1 tablet by mouth daily. 04/01/21   Love, Ivan Anchors, PA-C  rosuvastatin (CRESTOR) 10 MG tablet Take 10 mg by mouth at bedtime.    [provider]  senna (SENOKOT) 8.6 MG TABS tablet Take 1 tablet (8.6 mg total) by mouth at bedtime. PURCHASE OVER THE COUNTER 03/31/21   Love, Ivan Anchors, PA-C  traZODone (DESYREL) 50 MG tablet Take 1 tablet (50 mg total) by mouth at bedtime. 03/31/21   Love, Ivan Anchors, PA-C    Allergies    Shrimp [shellfish allergy]  Review of Systems   Review of Systems  Constitutional:  Negative for fever.  Gastrointestinal:  Negative for abdominal pain and vomiting.  Genitourinary:  Positive for dysuria and hematuria.  All other systems reviewed and are negative.  Physical Exam Updated Vital Signs BP (!) 161/65   Pulse 74   Temp 98.4 F (36.9 C) (Oral)   Resp 16   Ht 1.753 m (5\' 9" )   Wt 91.6 kg   SpO2 96%   BMI 29.83 kg/m   Physical Exam CONSTITUTIONAL: Elderly and frail HEAD: Normocephalic/atraumatic EYES: EOMI ENMT: Mucous membranes moist NECK: supple no meningeal signs CV: S1/S2 noted, no murmurs/rubs/gallops noted LUNGS: Lungs are clear to auscultation  bilaterally, no apparent distress ABDOMEN: soft, nontender, no rebound or guarding, bowel sounds noted throughout abdomen GU:no cva tenderness, no penile lesions, no blood noted at the meatus, spouse at bedside at patient request NEURO: Pt is awake/alert/appropriate, moves all extremitiesx4.  No facial droop.   EXTREMITIES: pulses normal/equal SKIN: warm, color normal PSYCH: no abnormalities of mood noted, alert and oriented to situation  ED Results / Procedures / Treatments   Labs (all labs ordered are listed, but only abnormal results are displayed) Labs Reviewed  URINALYSIS, ROUTINE W REFLEX MICROSCOPIC - Abnormal; Notable for  the following components:      Result Value   Color, Urine RED (*)    APPearance CLOUDY (*)    Hgb urine dipstick LARGE (*)    Protein, ur 100 (*)    Leukocytes,Ua TRACE (*)    RBC / HPF >50 (*)    All other components within normal limits  CBC - Abnormal; Notable for the following components:   RBC 3.22 (*)    Hemoglobin 9.5 (*)    HCT 30.5 (*)    All other components within normal limits  COMPREHENSIVE METABOLIC PANEL - Abnormal; Notable for the following components:   Glucose, Bld 132 (*)    AST 12 (*)    All other components within normal limits    EKG None  Radiology CT Renal Stone Study  Result Date: 04/12/2021 CLINICAL DATA:  Gross hematuria EXAM: CT ABDOMEN AND PELVIS WITHOUT CONTRAST TECHNIQUE: Multidetector CT imaging of the abdomen and pelvis was performed following the standard protocol without IV contrast. COMPARISON:  None. FINDINGS: Lower chest: No acute abnormality. Hepatobiliary: No focal hepatic abnormality. Gallbladder unremarkable. Pancreas: No focal abnormality or ductal dilatation. Spleen: No focal abnormality.  Normal size. Adrenals/Urinary Tract: Extensive stranding around the right kidney. Mild fullness of the right renal pelvis and collecting system. No visible renal or ureteral stones. Urinary bladder is decompressed,  unremarkable. Adrenals are unremarkable. Exophytic lesion off the lower pole of the right kidney measures 1.5 cm. Stomach/Bowel: Stomach, large and small bowel grossly unremarkable. Vascular/Lymphatic: Aortic atherosclerosis. No evidence of aneurysm or adenopathy. Reproductive: No visible focal abnormality. Other: No free fluid or free air. Musculoskeletal: No acute bony abnormality. IMPRESSION: Right perinephric stranding and pelvicaliectasis. No ureteral stones. This could be related to recently passed stone. Aortic atherosclerosis. 1.5 cm exophytic lesion off the lower pole of the right kidney cannot be fully characterized on this noncontrast study. This could be further evaluated with nonemergent contrast study or ultrasound. Electronically Signed   By: Rolm Baptise M.D.   On: 04/12/2021 03:20    Procedures Procedures   Medications Ordered in ED Medications  cefTRIAXone (ROCEPHIN) 1 g in sodium chloride 0.9 % 100 mL IVPB (has no administration in time range)  fentaNYL (SUBLIMAZE) injection 100 mcg (has no administration in time range)  ondansetron (ZOFRAN) injection 4 mg (has no administration in time range)  morphine 4 MG/ML injection 4 mg (4 mg Intramuscular Given 04/12/21 0246)    ED Course  I have reviewed the triage vital signs and the nursing notes.  Pertinent labs/imaging results that were available during my care of the patient were reviewed by me and considered in my medical decision making (see chart for details).    MDM Rules/Calculators/A&P                          Patient presented initially with hematuria and dysuria.  He initially appeared very comfortable.  During the ED stay he began having right flank pain and worsening symptoms.  Patient found to have hematuria.  CT imaging ordered which shows  likely recently passed stone.  No urinary retention. Patient reports continued pain, will proceed with IV pain medications and Rocephin for possible UTI Patient need to have  outpatient ultrasound or CT imaging of his kidney for lesion noted.  He reports he already has urology follow-up next month 5:29 AM Patient feeling improved. He is in no acute distress.  Will send home on Keflex, and he will follow-up with  urology next month Final Clinical Impression(s) / ED Diagnoses Final diagnoses:  Gross hematuria  Ureteral colic    Rx / DC Orders ED Discharge Orders          Ordered    cephALEXin (KEFLEX) 500 MG capsule  2 times daily        04/12/21 6063             Ripley Fraise, MD 04/12/21 0530

## 2021-04-13 LAB — URINE CULTURE: Culture: NO GROWTH

## 2021-04-30 ENCOUNTER — Emergency Department (HOSPITAL_COMMUNITY): Payer: Medicare Other

## 2021-04-30 ENCOUNTER — Encounter (HOSPITAL_COMMUNITY): Payer: Self-pay | Admitting: Emergency Medicine

## 2021-04-30 ENCOUNTER — Emergency Department (HOSPITAL_COMMUNITY)
Admission: EM | Admit: 2021-04-30 | Discharge: 2021-04-30 | Disposition: A | Discharge status: Home / Self Care | Payer: Medicare Other | Attending: Emergency Medicine | Admitting: Emergency Medicine

## 2021-04-30 DIAGNOSIS — W010XXA Fall on same level from slipping, tripping and stumbling without subsequent striking against object, initial encounter: Secondary | ICD-10-CM | POA: Insufficient documentation

## 2021-04-30 DIAGNOSIS — S0081XA Abrasion of other part of head, initial encounter: Secondary | ICD-10-CM | POA: Diagnosis not present

## 2021-04-30 DIAGNOSIS — Z7984 Long term (current) use of oral hypoglycemic drugs: Secondary | ICD-10-CM | POA: Diagnosis not present

## 2021-04-30 DIAGNOSIS — Z96642 Presence of left artificial hip joint: Secondary | ICD-10-CM | POA: Insufficient documentation

## 2021-04-30 DIAGNOSIS — I2510 Atherosclerotic heart disease of native coronary artery without angina pectoris: Secondary | ICD-10-CM | POA: Insufficient documentation

## 2021-04-30 DIAGNOSIS — I129 Hypertensive chronic kidney disease with stage 1 through stage 4 chronic kidney disease, or unspecified chronic kidney disease: Secondary | ICD-10-CM | POA: Diagnosis not present

## 2021-04-30 DIAGNOSIS — Z7982 Long term (current) use of aspirin: Secondary | ICD-10-CM | POA: Diagnosis not present

## 2021-04-30 DIAGNOSIS — R0789 Other chest pain: Secondary | ICD-10-CM | POA: Diagnosis not present

## 2021-04-30 DIAGNOSIS — Z7902 Long term (current) use of antithrombotics/antiplatelets: Secondary | ICD-10-CM | POA: Diagnosis not present

## 2021-04-30 DIAGNOSIS — Z87891 Personal history of nicotine dependence: Secondary | ICD-10-CM | POA: Diagnosis not present

## 2021-04-30 DIAGNOSIS — N182 Chronic kidney disease, stage 2 (mild): Secondary | ICD-10-CM | POA: Insufficient documentation

## 2021-04-30 DIAGNOSIS — E1122 Type 2 diabetes mellitus with diabetic chronic kidney disease: Secondary | ICD-10-CM | POA: Diagnosis not present

## 2021-04-30 DIAGNOSIS — W19XXXA Unspecified fall, initial encounter: Secondary | ICD-10-CM

## 2021-04-30 DIAGNOSIS — Z794 Long term (current) use of insulin: Secondary | ICD-10-CM | POA: Diagnosis not present

## 2021-04-30 DIAGNOSIS — Z79899 Other long term (current) drug therapy: Secondary | ICD-10-CM | POA: Diagnosis not present

## 2021-04-30 DIAGNOSIS — Z8546 Personal history of malignant neoplasm of prostate: Secondary | ICD-10-CM | POA: Diagnosis not present

## 2021-04-30 DIAGNOSIS — S022XXA Fracture of nasal bones, initial encounter for closed fracture: Secondary | ICD-10-CM

## 2021-04-30 DIAGNOSIS — S0992XA Unspecified injury of nose, initial encounter: Secondary | ICD-10-CM | POA: Diagnosis present

## 2021-04-30 DIAGNOSIS — Z951 Presence of aortocoronary bypass graft: Secondary | ICD-10-CM | POA: Diagnosis not present

## 2021-04-30 MED ORDER — OXYCODONE-ACETAMINOPHEN 5-325 MG PO TABS
1.0000 | ORAL_TABLET | Freq: Once | ORAL | Status: AC
  Administered 2021-04-30: 1 via ORAL
  Filled 2021-04-30: qty 1

## 2021-04-30 MED ORDER — METHOCARBAMOL 500 MG PO TABS: 500.0000 mg | ORAL_TABLET | Freq: Four times a day (QID) | ORAL | 0 refills | Status: AC | PRN

## 2021-04-30 NOTE — ED Provider Notes (Signed)
[facility] DEPT Provider Note   CSN: [id number] Arrival date & time: 04/30/21  1803     History Chief Complaint  Patient presents with   Dominic Mendoza is a 70 y.o. male.  Presents to ER after fall.  Patient reports that tripped and fell face first onto concrete.  States that he is having some pain to his nose and had small amount of nosebleed but does not have any ongoing bleeding.  Does have some pain over the right side of his chest associated with the fall.  Does not have any abdominal pain.  He denies any neck or back pain.  Has been ambulatory without difficulty since fall.  Takes aspirin and Plavix.  States he takes this for heart disease.  HPI     Past Medical History:  Diagnosis Date   Arthritis    Cancer ([place])    prostate   Diabetes mellitus without complication ([place])    Dyspnea    GERD (gastroesophageal reflux disease)    Hypertension    Smokers' cough [facility])     Patient Active Problem List   Diagnosis Date Noted   Hyponatremia    Uncontrolled type 2 diabetes mellitus with hyperglycemia ([place])    Essential hypertension    Acute blood loss anemia    Postoperative pain    Orthostatic hypotension    Left displaced femoral neck fracture ([place]) 03/22/2021   Closed left hip fracture ([place]) 03/18/2021   Hypertension    Diabetes mellitus without complication ([facility])    CKD (chronic kidney disease), stage II    CAD (coronary artery disease)    History of CVA (cerebrovascular accident)     Past Surgical History:  Procedure Laterality Date   COLONOSCOPY     CORONARY ARTERY BYPASS GRAFT     triple   LYMPHADENECTOMY Bilateral 02/25/2013   Procedure: LYMPHADENECTOMY;  Surgeon: Dutch Gray, MD;  [facility];  Service: Urology;  Laterality: Bilateral;  Bilateral Pelvic    ROBOT ASSISTED LAPAROSCOPIC RADICAL PROSTATECTOMY N/A 02/25/2013   Procedure: ROBOTIC ASSISTED LAPAROSCOPIC RADICAL PROSTATECTOMY LEVEL 2;  Surgeon: Dutch Gray,  MD;  Location: [facility];  Service: Urology;  Laterality: N/A;   stent left leg     TOTAL HIP ARTHROPLASTY Left 03/18/2021   Procedure: TOTAL HIP ARTHROPLASTY ANTERIOR APPROACH;  Surgeon: Rod Can, MD;  [facility];  Service: Orthopedics;  Laterality: Left;       History reviewed. No pertinent family history.  Social History   Tobacco Use   Smoking status: Former    Packs/day: 2.00    Years: 31.00    Pack years: 62.00    Types: Cigarettes   Smokeless tobacco: Never   Tobacco comments:    July 27, 2021  Vaping Use   Vaping Use: Never used  Substance Use Topics   Alcohol use: No   Drug use: No    Home Medications Prior to Admission medications   Medication Sig Start Date End Date Taking? Authorizing Provider  acetaminophen (TYLENOL) 325 MG tablet Take 1-2 tablets (325-650 mg total) by mouth every 6 (six) hours as needed for mild pain. 03/31/21   Love, Ivan Anchors, PA-C  amLODipine (NORVASC) 5 MG tablet Take 1 tablet (5 mg total) by mouth daily. 03/31/21   Love, Ivan Anchors, PA-C  aspirin (ASPIRIN CHILDRENS) 81 MG chewable tablet Chew 1 tablet (81 mg total) by mouth 2 (two) times daily with a meal. 03/22/21 05/06/21  Cherlynn June  B, PA  cephALEXin (KEFLEX) 500 MG capsule Take 1 capsule (500 mg total) by mouth 2 (two) times daily. 04/12/21   Ripley Fraise, MD  clopidogrel (PLAVIX) 75 MG tablet Take 75 mg by mouth daily.    [provider]  docusate sodium (COLACE) 100 MG capsule Take 1 capsule (100 mg total) by mouth 2 (two) times daily. PURCHASE OVER THE COUNTER--OTC 03/31/21   Love, Ivan Anchors, PA-C  famotidine (PEPCID) 20 MG tablet Take 20 mg by mouth 2 (two) times daily. 01/06/21   [provider]  glipiZIDE (GLUCOTROL) 5 MG tablet Take 5 mg by mouth 2 (two) times daily. 02/06/21   [provider]  Insulin Glargine (BASAGLAR KWIKPEN) 100 UNIT/ML Inject 8 Units into the skin daily. 03/31/21   Love, Ivan Anchors, PA-C  Insulin Pen Needle (CARETOUCH PEN NEEDLES)  31G X 5 MM MISC 1 application by Does not apply route daily. 03/31/21   Love, Ivan Anchors, PA-C  lidocaine (LIDODERM) 5 % APPLY AT 7 PM AND REMOVE AT 7 AM DAILY--NEED TO PURCHASE OVER THE COUNTER IF INSURANCE DOES NOT  COVER IT. 03/31/21   Love, Ivan Anchors, PA-C  melatonin 3 MG TABS tablet Take 0.5 tablets (1.5 mg total) by mouth at bedtime. PURCHASE OVER THE COUNTER 03/31/21   Love, Ivan Anchors, PA-C  metFORMIN (GLUCOPHAGE-XR) 500 MG 24 hr tablet Take 2 tablets (1,000 mg total) by mouth 2 (two) times daily. 03/31/21   Love, Ivan Anchors, PA-C  methocarbamol (ROBAXIN) 500 MG tablet Take 1 tablet (500 mg total) by mouth every 6 (six) hours as needed for muscle spasms. 04/30/21   Lucrezia Starch, MD  metoprolol succinate (TOPROL-XL) 25 MG 24 hr tablet Take 1 tablet (25 mg total) by mouth daily. 03/31/21   Love, Ivan Anchors, PA-C  Multiple Vitamin (MULTIVITAMIN WITH MINERALS) TABS tablet Take 1 tablet by mouth daily. 04/01/21   Love, Ivan Anchors, PA-C  rosuvastatin (CRESTOR) 10 MG tablet Take 10 mg by mouth at bedtime.    [provider]  senna (SENOKOT) 8.6 MG TABS tablet Take 1 tablet (8.6 mg total) by mouth at bedtime. PURCHASE OVER THE COUNTER 03/31/21   Love, Ivan Anchors, PA-C  traZODone (DESYREL) 50 MG tablet Take 1 tablet (50 mg total) by mouth at bedtime. 03/31/21   Love, Ivan Anchors, PA-C    Allergies    Shrimp [shellfish allergy]  Review of Systems   Review of Systems  Constitutional:  Negative for chills and fever.  HENT:  Positive for nosebleeds. Negative for ear pain and sore throat.   Eyes:  Negative for pain and visual disturbance.  Respiratory:  Negative for cough and shortness of breath.   Cardiovascular:  Positive for chest pain. Negative for palpitations.  Gastrointestinal:  Negative for abdominal pain and vomiting.  Genitourinary:  Negative for dysuria and hematuria.  Musculoskeletal:  Positive for arthralgias. Negative for back pain.  Skin:  Negative for color change and rash.  Neurological:  Negative  for seizures and syncope.  All other systems reviewed and are negative.  Physical Exam Updated Vital Signs BP (!) 153/83   Pulse 75   Temp 98 F (36.7 C) (Oral)   Resp 18   Ht 5\' 9"  (1.753 m)   Wt 93.9 kg   SpO2 99%   BMI 30.57 kg/m   Physical Exam Vitals and nursing note reviewed.  Constitutional:      Appearance: He is well-developed.  HENT:     Head: Normocephalic.  Comments: Superficial abrasion over forehead, nose, there is some swelling to the nose, no nasal septal hematoma, no active nosebleed Eyes:     Conjunctiva/sclera: Conjunctivae normal.  Cardiovascular:     Rate and Rhythm: Normal rate and regular rhythm.     Heart sounds: No murmur heard. Pulmonary:     Effort: Pulmonary effort is normal. No respiratory distress.     Breath sounds: Normal breath sounds.     Comments: Some tenderness noted over the anterior right chest wall, no crepitus or deformity appreciated Abdominal:     Palpations: Abdomen is soft.     Tenderness: There is no abdominal tenderness.  Musculoskeletal:     Cervical back: Neck supple.     Comments: Back: no C, T, L spine TTP, no step off or deformity RUE: no TTP throughout, no deformity, normal joint ROM, radial pulse intact, distal sensation and motor intact LUE: no TTP throughout, no deformity, normal joint ROM, radial pulse intact, distal sensation and motor intact RLE:  no TTP throughout, no deformity, normal joint ROM, distal pulse, sensation and motor intact LLE: no TTP throughout, no deformity, normal joint ROM, distal pulse, sensation and motor intact  Skin:    General: Skin is warm and dry.  Neurological:     Mental Status: He is alert.    ED Results / Procedures / Treatments   Labs (all labs ordered are listed, but only abnormal results are displayed) Labs Reviewed - No data to display  EKG None  Radiology DG Ribs Unilateral W/Chest Right  Result Date: 04/30/2021 CLINICAL DATA:  Right rib pain after a fall. EXAM:  RIGHT RIBS AND CHEST - 3+ VIEW COMPARISON:  03/26/2021 FINDINGS: Postoperative changes in the mediastinum. Linear atelectasis or fibrosis in the left mid and lower lung is unchanged since prior study. No developing consolidation or airspace disease. No pleural effusions. No pneumothorax. Mediastinal contours appear intact. Calcification of the aorta. Right ribs appear intact. No acute displaced fractures identified. No focal bone lesion or bone destruction. Soft tissues are unremarkable. Vascular calcifications. IMPRESSION: Unchanged appearance of chest since previous study. No evidence of active pulmonary disease. Negative right ribs. Electronically Signed   By: Lucienne Capers M.D.   On: 04/30/2021 20:04   CT Head Wo Contrast  Result Date: 04/30/2021 CLINICAL DATA:  Status post fall. EXAM: CT HEAD WITHOUT CONTRAST TECHNIQUE: Contiguous axial images were obtained from the base of the skull through the vertex without intravenous contrast. COMPARISON:  None. FINDINGS: Brain: There is mild cerebral atrophy with widening of the extra-axial spaces and ventricular dilatation. There are areas of decreased attenuation within the white matter tracts of the supratentorial brain, consistent with microvascular disease changes. Vascular: No hyperdense vessel or unexpected calcification. Skull: Bilateral nondisplaced nasal bone fractures of indeterminate age are seen. Sinuses/Orbits: No acute finding. Other: None. IMPRESSION: 1. Generalized cerebral atrophy. 2. No acute intracranial abnormality. Electronically Signed   By: Virgina Norfolk M.D.   On: 04/30/2021 19:25   CT Maxillofacial Wo Contrast  Result Date: 04/30/2021 CLINICAL DATA:  Status post fall. EXAM: CT MAXILLOFACIAL WITHOUT CONTRAST TECHNIQUE: Multidetector CT imaging of the maxillofacial structures was performed. Multiplanar CT image reconstructions were also generated. COMPARISON:  None. FINDINGS: Osseous: Bilateral mildly displaced nasal bone fractures are  seen. Orbits: Negative. No traumatic or inflammatory finding. Sinuses: Clear. Soft tissues: Negative. Limited intracranial: No significant or unexpected finding. IMPRESSION: Bilateral mildly displaced nasal bone fractures. Electronically Signed   By: Virgina Norfolk M.D.   On:  04/30/2021 19:27    Procedures Procedures   Medications Ordered in ED Medications  oxyCODONE-acetaminophen (PERCOCET/ROXICET) 5-325 MG per tablet 1 tablet (1 tablet Oral Given 04/30/21 1906)    ED Course  I have reviewed the triage vital signs and the nursing notes.  Pertinent labs & imaging results that were available during my care of the patient were reviewed by me and considered in my medical decision making (see chart for details).    MDM Rules/Calculators/A&P                          70 year old male presenting to ER with concern for facial trauma after mechanical fall.  Patient denies any LOC.  He appeared well, on trauma assessment noted slight deformity to his nose as well as some tenderness over his anterior chest wall.  CT head and max face notable for mildly displaced nasal bone fractures.  No intracranial pathology.  Plain films of chest were negative.  On reassessment he remains well-appearing in no distress.  I recommend he follow-up with on-call for max face trauma which is Dr. Marla Roe with plastic surgery tonight.  No ongoing nosebleed in ER.  Discharged home in stable condition.  Reviewed return precautions.    After the discussed management above, the patient was determined to be safe for discharge.  The patient was in agreement with this plan and all questions regarding their care were answered.  ED return precautions were discussed and the patient will return to the ED with any significant worsening of condition.  Final Clinical Impression(s) / ED Diagnoses Final diagnoses:  Closed fracture of nasal bone, initial encounter  Fall, initial encounter    Rx / DC Orders ED Discharge Orders           Ordered    methocarbamol (ROBAXIN) 500 MG tablet  Every 6 hours PRN        04/30/21 2050             Lucrezia Starch, MD 05/01/21 [phone]

## 2021-04-30 NOTE — Discharge Instructions (Addendum)
Follow-up with specialist provided regarding your broken nose.  Call their clinic Monday morning.  Come back to ER if you develop worsening pain, difficulty breathing, uncontrolled nosebleed or other new concerning symptom.

## 2021-04-30 NOTE — ED Provider Notes (Signed)
Emergency Medicine Provider Triage Evaluation Note  Dominic Mendoza , a 70 y.o. male  was evaluated in triage.  Pt complains of mechanical fall that occurred prior to arrival.  States that he fell face first onto concrete.  No loss of consciousness but states that he was bleeding from his nose.  He takes aspirin and Plavix daily.  Review of Systems  Positive: Facial pain Negative: Loss of consciousness  Physical Exam  There were no vitals taken for this visit. Gen:   Awake, no distress   Resp:  Normal effort  MSK:   Moves extremities without difficulty  Other:  Abrasions noted to extremities no active bleeding from nares  Medical Decision Making  Medically screening exam initiated at 6:24 PM.  Appropriate orders placed.  PAULINE TRAINER was informed that the remainder of the evaluation will be completed by another provider, this initial triage assessment does not replace that evaluation, and the importance of remaining in the ED until their evaluation is complete.  Will room soon I have ordered CT imaging but may require additional   Delia Heady, PA-C 04/30/21 1827    Lucrezia Starch, MD 05/01/21 1555

## 2021-04-30 NOTE — ED Triage Notes (Signed)
Pt states that he had a mechanical fall today on the concrete. States he hit his nose and has skin tears about his hands and arms. No LOC. Recent hip replacement in the last month. Is on blood thinners. Alert and oriented.

## 2021-05-07 ENCOUNTER — Encounter: Payer: Self-pay | Admitting: Plastic Surgery

## 2021-05-07 ENCOUNTER — Other Ambulatory Visit: Payer: Self-pay

## 2021-05-07 ENCOUNTER — Ambulatory Visit (INDEPENDENT_AMBULATORY_CARE_PROVIDER_SITE_OTHER): Payer: Medicare Other | Admitting: Plastic Surgery

## 2021-05-07 DIAGNOSIS — S022XXA Fracture of nasal bones, initial encounter for closed fracture: Secondary | ICD-10-CM | POA: Diagnosis not present
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
## 2022-07-01 IMAGING — CT CT RENAL STONE PROTOCOL
2 of 4 series · 16 of 46 positions shown, 18 images · non-contrast
Comparison: None.

CLINICAL DATA: Gross hematuria

EXAM:
CT ABDOMEN AND PELVIS WITHOUT CONTRAST
TECHNIQUE: Multidetector CT imaging of the abdomen and pelvis was performed
following the standard protocol without IV contrast.

[Series 2: axial st · axial · 0.81mm/px · z∈[+1098,+1498]mm · 13 of 90 slices shown, 15 images]
[im 5/90  soft-tissue]
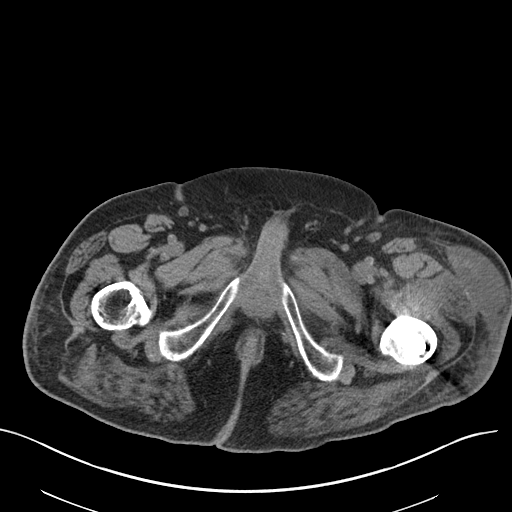
[im 5/90  bone]
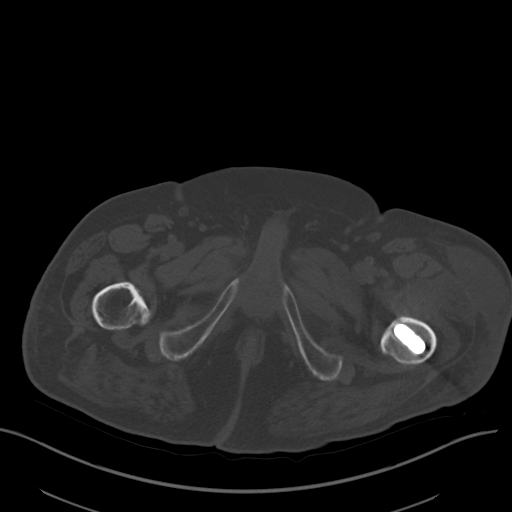
[im 13/90  soft-tissue]
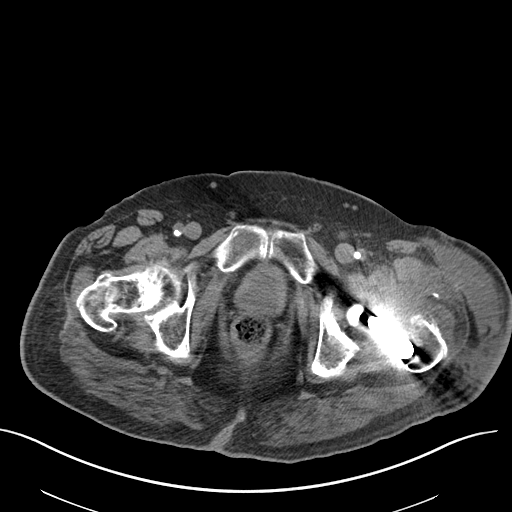
[im 17/90  soft-tissue]
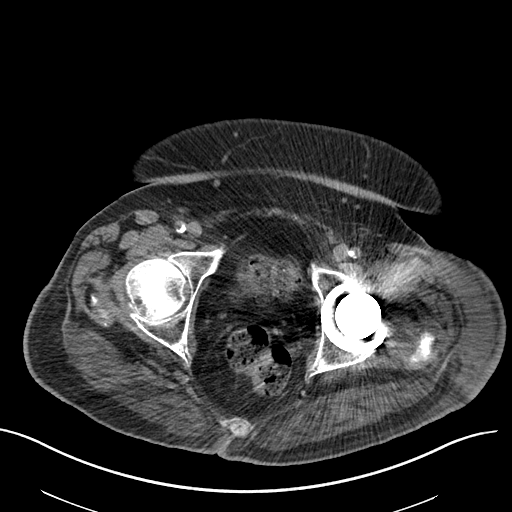
[im 26/90  soft-tissue]
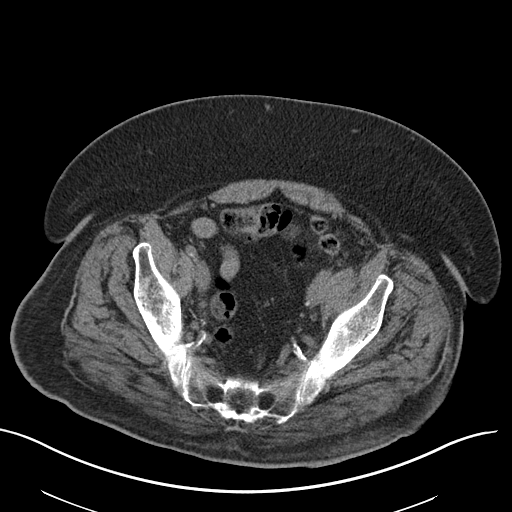
[im 30/90  soft-tissue]
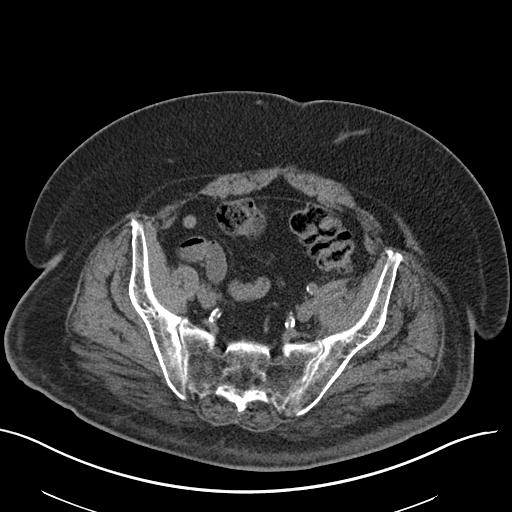
[im 39/90  soft-tissue]
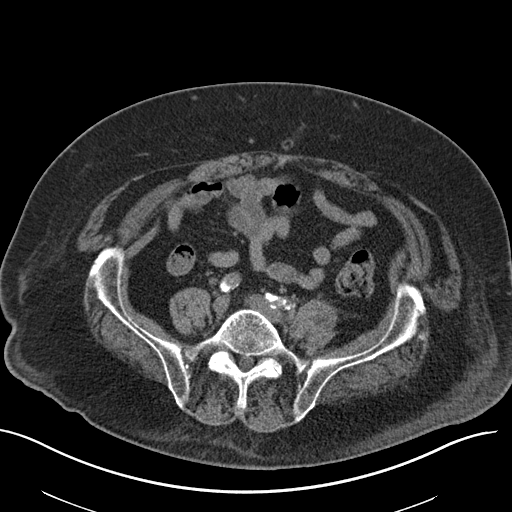
[im 47/90  soft-tissue]
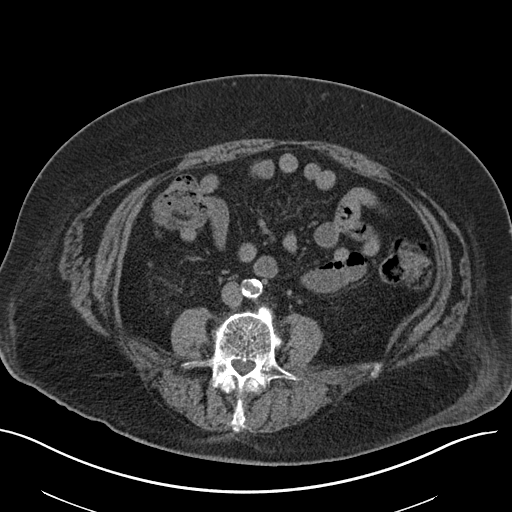
[im 51/90  soft-tissue]
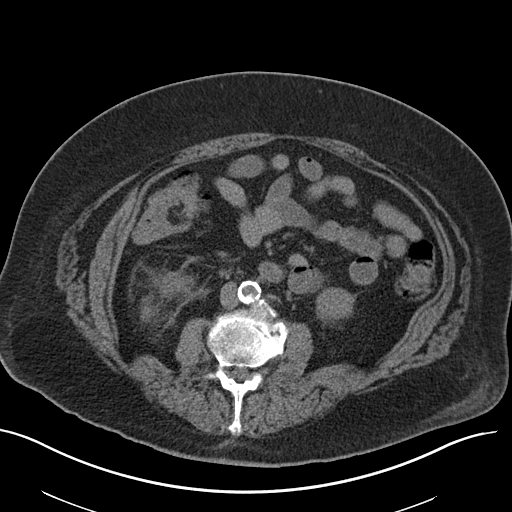
[im 60/90  soft-tissue]
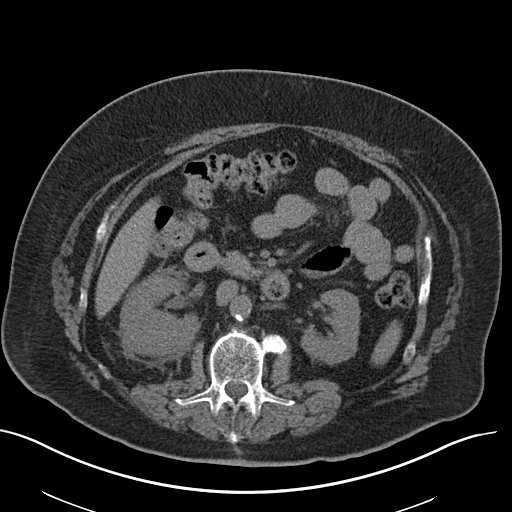
[im 60/90  bone]
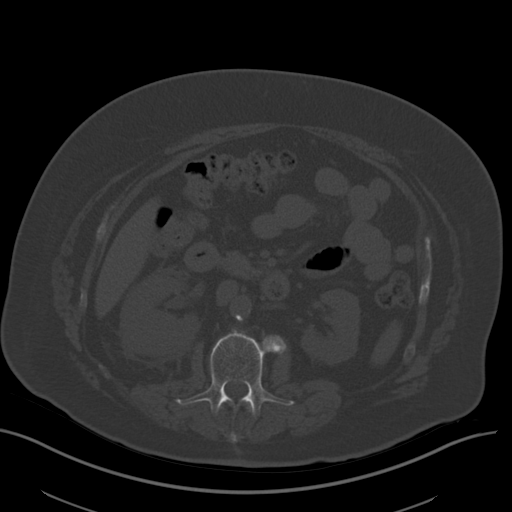
[im 64/90  soft-tissue]
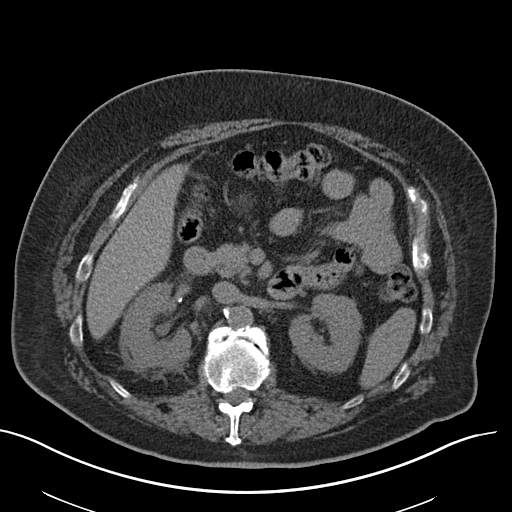
[im 73/90  soft-tissue]
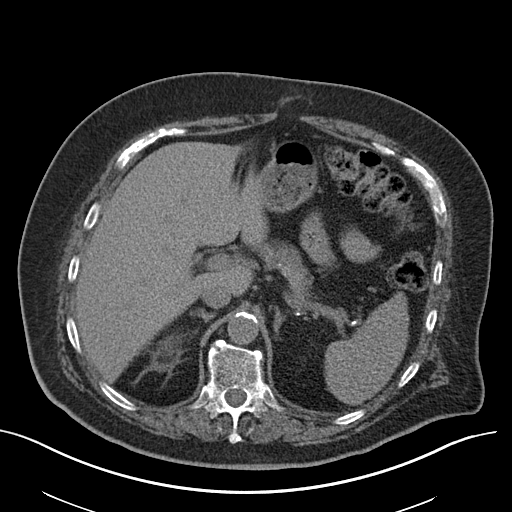
[im 77/90  soft-tissue]
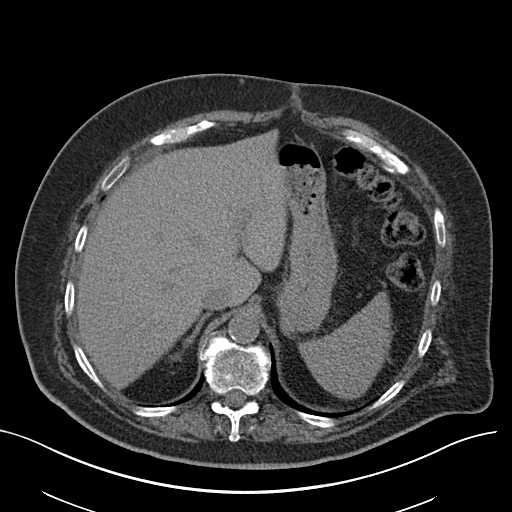
[im 85/90  soft-tissue]
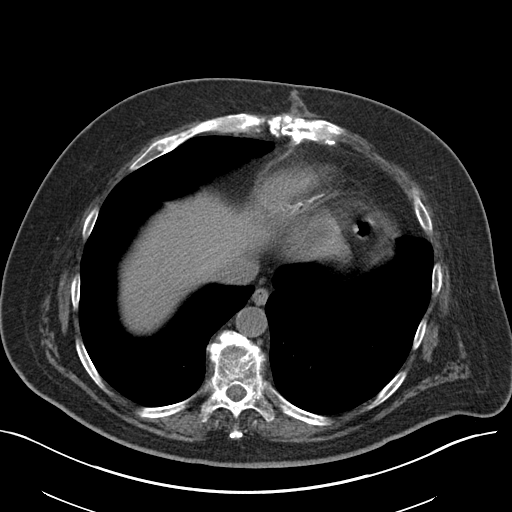

[Series 4: coronal · coronal · 0.76mm/px · 3 of 151 slices shown]
[im 51/151  soft-tissue]
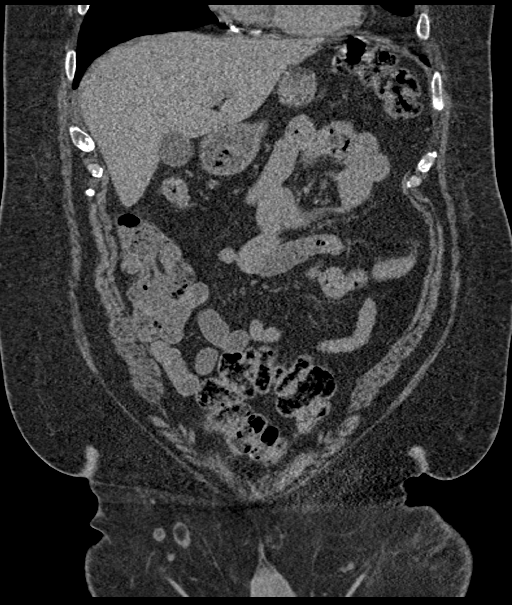
[im 67/151  soft-tissue]
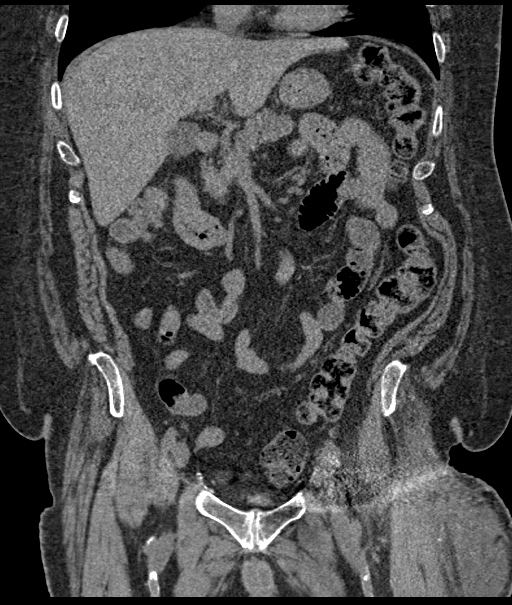
[im 84/151  soft-tissue]
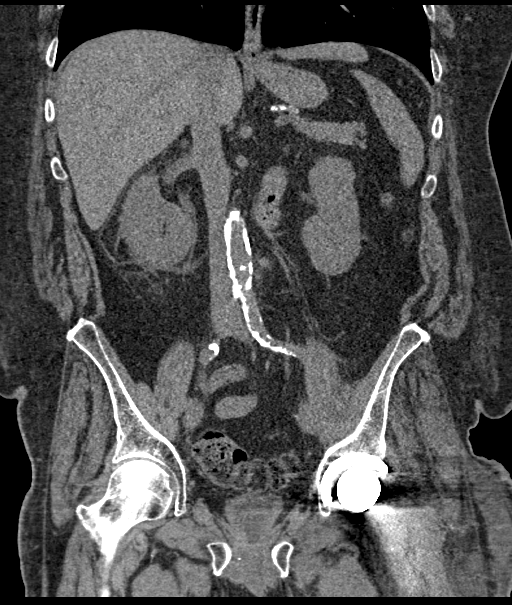

[16 of 46 positions shown; findings below may reference images not displayed]

FINDINGS: Lower chest: No acute abnormality.

Hepatobiliary: No focal hepatic abnormality. Gallbladder
unremarkable.

Pancreas: No focal abnormality or ductal dilatation.

Spleen: No focal abnormality.  Normal size.

Adrenals/Urinary Tract: Extensive stranding around the right kidney.
Mild fullness of the right renal pelvis and collecting system. No
visible renal or ureteral stones. Urinary bladder is decompressed,
unremarkable. Adrenals are unremarkable. Exophytic lesion off the
lower pole of the right kidney measures 1.5 cm.

Stomach/Bowel: Stomach, large and small bowel grossly unremarkable.

Vascular/Lymphatic: Aortic atherosclerosis. No evidence of aneurysm
or adenopathy.

Reproductive: No visible focal abnormality.

Other: No free fluid or free air.

Musculoskeletal: No acute bony abnormality.
IMPRESSION: Right perinephric stranding and pelvicaliectasis. No ureteral
stones. This could be related to recently passed stone.

Aortic atherosclerosis.

1.5 cm exophytic lesion off the lower pole of the right kidney
cannot be fully characterized on this noncontrast study. This could
be further evaluated with nonemergent contrast study or ultrasound.
